# Patient Record
Sex: Female | Born: 1935 | Race: White | Hispanic: No | Smoking: Never smoker
Health system: Southern US, Community
[De-identification: ages and names within clinical notes are randomized; demographics above are authoritative.]

## PROBLEM LIST (undated history)

## (undated) DIAGNOSIS — H409 Unspecified glaucoma: Secondary | ICD-10-CM

## (undated) DIAGNOSIS — E785 Hyperlipidemia, unspecified: Secondary | ICD-10-CM

## (undated) DIAGNOSIS — R001 Bradycardia, unspecified: Secondary | ICD-10-CM

## (undated) DIAGNOSIS — M8580 Other specified disorders of bone density and structure, unspecified site: Secondary | ICD-10-CM

## (undated) DIAGNOSIS — M109 Gout, unspecified: Secondary | ICD-10-CM

## (undated) DIAGNOSIS — R42 Dizziness and giddiness: Secondary | ICD-10-CM

## (undated) DIAGNOSIS — I10 Essential (primary) hypertension: Secondary | ICD-10-CM

## (undated) DIAGNOSIS — E119 Type 2 diabetes mellitus without complications: Secondary | ICD-10-CM

## (undated) DIAGNOSIS — K5790 Diverticulosis of intestine, part unspecified, without perforation or abscess without bleeding: Secondary | ICD-10-CM

## (undated) DIAGNOSIS — M722 Plantar fascial fibromatosis: Secondary | ICD-10-CM

## (undated) HISTORY — DX: Dizziness and giddiness: R42

## (undated) HISTORY — DX: Diverticulosis of intestine, part unspecified, without perforation or abscess without bleeding: K57.90

## (undated) HISTORY — DX: Hyperlipidemia, unspecified: E78.5

## (undated) HISTORY — DX: Bradycardia, unspecified: R00.1

## (undated) HISTORY — PX: NASAL SEPTUM SURGERY: SHX37

## (undated) HISTORY — DX: Other specified disorders of bone density and structure, unspecified site: M85.80

## (undated) HISTORY — DX: Essential (primary) hypertension: I10

## (undated) HISTORY — PX: EYE SURGERY: SHX253

## (undated) HISTORY — PX: ABDOMINAL HYSTERECTOMY: SHX81

## (undated) HISTORY — DX: Unspecified glaucoma: H40.9

## (undated) HISTORY — PX: CHOLECYSTECTOMY: SHX55

## (undated) HISTORY — DX: Gout, unspecified: M10.9

## (undated) HISTORY — DX: Type 2 diabetes mellitus without complications: E11.9

## (undated) HISTORY — DX: Plantar fascial fibromatosis: M72.2

---

## 2000-07-23 ENCOUNTER — Encounter: Payer: Self-pay | Admitting: Family Medicine

## 2000-07-23 ENCOUNTER — Ambulatory Visit (HOSPITAL_COMMUNITY): Admission: RE | Admit: 2000-07-23 | Discharge: 2000-07-23 | Payer: Self-pay | Admitting: Family Medicine

## 2001-03-10 ENCOUNTER — Other Ambulatory Visit: Admission: RE | Admit: 2001-03-10 | Discharge: 2001-03-10 | Payer: Self-pay | Admitting: Dermatology

## 2001-08-06 ENCOUNTER — Ambulatory Visit (HOSPITAL_COMMUNITY): Admission: RE | Admit: 2001-08-06 | Discharge: 2001-08-06 | Payer: Self-pay | Admitting: Family Medicine

## 2001-08-06 ENCOUNTER — Encounter: Payer: Self-pay | Admitting: Family Medicine

## 2002-05-18 ENCOUNTER — Other Ambulatory Visit: Admission: RE | Admit: 2002-05-18 | Discharge: 2002-05-18 | Payer: Self-pay | Admitting: Dermatology

## 2002-08-19 ENCOUNTER — Ambulatory Visit (HOSPITAL_COMMUNITY): Admission: RE | Admit: 2002-08-19 | Discharge: 2002-08-19 | Payer: Self-pay

## 2003-08-23 ENCOUNTER — Ambulatory Visit (HOSPITAL_COMMUNITY): Admission: RE | Admit: 2003-08-23 | Discharge: 2003-08-23 | Payer: Self-pay | Admitting: Family Medicine

## 2004-08-28 ENCOUNTER — Ambulatory Visit (HOSPITAL_COMMUNITY): Admission: RE | Admit: 2004-08-28 | Discharge: 2004-08-28 | Payer: Self-pay | Admitting: Family Medicine

## 2005-08-31 ENCOUNTER — Ambulatory Visit (HOSPITAL_COMMUNITY): Admission: RE | Admit: 2005-08-31 | Discharge: 2005-08-31 | Payer: Self-pay | Admitting: Family Medicine

## 2006-09-03 ENCOUNTER — Ambulatory Visit (HOSPITAL_COMMUNITY): Admission: RE | Admit: 2006-09-03 | Discharge: 2006-09-03 | Payer: Self-pay | Admitting: Family Medicine

## 2007-09-04 ENCOUNTER — Ambulatory Visit (HOSPITAL_COMMUNITY): Admission: RE | Admit: 2007-09-04 | Discharge: 2007-09-04 | Payer: Self-pay | Admitting: Physician Assistant

## 2008-09-15 ENCOUNTER — Ambulatory Visit (HOSPITAL_COMMUNITY): Admission: RE | Admit: 2008-09-15 | Discharge: 2008-09-15 | Payer: Self-pay | Admitting: Family Medicine

## 2009-07-29 LAB — FECAL OCCULT BLOOD, GUAIAC: Fecal Occult Blood: NEGATIVE

## 2009-09-20 ENCOUNTER — Ambulatory Visit (HOSPITAL_COMMUNITY): Admission: RE | Admit: 2009-09-20 | Discharge: 2009-09-20 | Payer: Self-pay | Admitting: Family Medicine

## 2009-11-16 LAB — HM DEXA SCAN

## 2010-04-11 LAB — HM DIABETES EYE EXAM

## 2010-06-05 ENCOUNTER — Encounter: Payer: Self-pay | Admitting: Nurse Practitioner

## 2010-06-05 DIAGNOSIS — M722 Plantar fascial fibromatosis: Secondary | ICD-10-CM | POA: Insufficient documentation

## 2010-06-05 DIAGNOSIS — E785 Hyperlipidemia, unspecified: Secondary | ICD-10-CM

## 2010-06-05 DIAGNOSIS — H409 Unspecified glaucoma: Secondary | ICD-10-CM | POA: Insufficient documentation

## 2010-06-05 DIAGNOSIS — K5730 Diverticulosis of large intestine without perforation or abscess without bleeding: Secondary | ICD-10-CM

## 2010-06-05 DIAGNOSIS — M8580 Other specified disorders of bone density and structure, unspecified site: Secondary | ICD-10-CM

## 2010-06-05 DIAGNOSIS — I10 Essential (primary) hypertension: Secondary | ICD-10-CM

## 2010-06-05 DIAGNOSIS — I152 Hypertension secondary to endocrine disorders: Secondary | ICD-10-CM | POA: Insufficient documentation

## 2010-06-05 DIAGNOSIS — R001 Bradycardia, unspecified: Secondary | ICD-10-CM | POA: Insufficient documentation

## 2010-06-05 LAB — HEMOGLOBIN A1C: Hgb A1c MFr Bld: 6 % (ref 4.0–6.0)

## 2010-08-25 ENCOUNTER — Other Ambulatory Visit (HOSPITAL_COMMUNITY): Payer: Self-pay | Admitting: Family Medicine

## 2010-08-25 DIAGNOSIS — Z139 Encounter for screening, unspecified: Secondary | ICD-10-CM

## 2010-09-25 ENCOUNTER — Ambulatory Visit (HOSPITAL_COMMUNITY)
Admission: RE | Admit: 2010-09-25 | Discharge: 2010-09-25 | Disposition: A | Discharge status: Home / Self Care | Payer: Medicare Other | Source: Ambulatory Visit | Attending: Family Medicine | Admitting: Family Medicine

## 2010-09-25 DIAGNOSIS — Z139 Encounter for screening, unspecified: Secondary | ICD-10-CM

## 2010-09-25 DIAGNOSIS — Z1231 Encounter for screening mammogram for malignant neoplasm of breast: Secondary | ICD-10-CM | POA: Insufficient documentation

## 2011-08-27 ENCOUNTER — Other Ambulatory Visit: Payer: Self-pay | Admitting: Family Medicine

## 2011-08-27 DIAGNOSIS — Z139 Encounter for screening, unspecified: Secondary | ICD-10-CM

## 2011-10-08 ENCOUNTER — Ambulatory Visit (HOSPITAL_COMMUNITY)
Admission: RE | Admit: 2011-10-08 | Discharge: 2011-10-08 | Disposition: A | Discharge status: Home / Self Care | Payer: Medicare Other | Source: Ambulatory Visit | Attending: Family Medicine | Admitting: Family Medicine

## 2011-10-08 DIAGNOSIS — Z139 Encounter for screening, unspecified: Secondary | ICD-10-CM

## 2011-10-08 DIAGNOSIS — Z1231 Encounter for screening mammogram for malignant neoplasm of breast: Secondary | ICD-10-CM | POA: Insufficient documentation

## 2012-05-26 ENCOUNTER — Other Ambulatory Visit: Payer: Self-pay | Admitting: *Deleted

## 2012-06-02 ENCOUNTER — Other Ambulatory Visit: Payer: Self-pay | Admitting: Nurse Practitioner

## 2012-06-19 ENCOUNTER — Telehealth: Payer: Self-pay | Admitting: *Deleted

## 2012-06-19 MED ORDER — MECLIZINE HCL 25 MG PO TABS: 25.0000 mg | ORAL_TABLET | Freq: Three times a day (TID) | ORAL | Status: DC | PRN

## 2012-06-19 NOTE — Telephone Encounter (Signed)
PT NEEDS A ORDER FOR MECLIZINE 25MG  TO [facility] WITH DIRECTIONS AND QUANTITY. BUT NEVER FILLED AT [facility] BEFORE. THANKS.

## 2012-06-19 NOTE — Telephone Encounter (Signed)
RX sent to [facility] 

## 2012-06-19 NOTE — Telephone Encounter (Signed)
LAST SEEN 2/14 

## 2012-07-18 ENCOUNTER — Ambulatory Visit: Payer: Self-pay | Admitting: Nurse Practitioner

## 2012-07-30 ENCOUNTER — Ambulatory Visit (INDEPENDENT_AMBULATORY_CARE_PROVIDER_SITE_OTHER): Payer: Medicare Other

## 2012-07-30 ENCOUNTER — Ambulatory Visit (INDEPENDENT_AMBULATORY_CARE_PROVIDER_SITE_OTHER): Payer: Medicare Other | Admitting: Pharmacist

## 2012-07-30 ENCOUNTER — Encounter: Payer: Self-pay | Admitting: Pharmacist

## 2012-07-30 VITALS — Ht 61.0 in | Wt 176.0 lb

## 2012-07-30 DIAGNOSIS — M949 Disorder of cartilage, unspecified: Secondary | ICD-10-CM

## 2012-07-30 DIAGNOSIS — M899 Disorder of bone, unspecified: Secondary | ICD-10-CM

## 2012-07-30 DIAGNOSIS — M8580 Other specified disorders of bone density and structure, unspecified site: Secondary | ICD-10-CM

## 2012-07-30 NOTE — Patient Instructions (Signed)
Consider Silver Sneakers program for exercise - recommend weight bearing exercise 3 - 4 times per week   Fall Prevention and Home Safety Falls cause injuries and can affect all age groups. It is possible to use preventive measures to significantly decrease the likelihood of falls. There are many simple measures which can make your home safer and prevent falls. OUTDOORS  Repair cracks and edges of walkways and driveways.  Remove high doorway thresholds.  Trim shrubbery on the main path into your home.  Have good outside lighting.  Clear walkways of tools, rocks, debris, and clutter.  Check that handrails are not broken and are securely fastened. Both sides of steps should have handrails.  Have leaves, snow, and ice cleared regularly.  Use sand or salt on walkways during winter months.  In the garage, clean up grease or oil spills. BATHROOM  Install night lights.  Install grab bars by the toilet and in the tub and shower.  Use non-skid mats or decals in the tub or shower.  Place a plastic non-slip stool in the shower to sit on, if needed.  Keep floors dry and clean up all water on the floor immediately.  Remove soap buildup in the tub or shower on a regular basis.  Secure bath mats with non-slip, double-sided rug tape.  Remove throw rugs and tripping hazards from the floors. BEDROOMS  Install night lights.  Make sure a bedside light is easy to reach.  Do not use oversized bedding.  Keep a telephone by your bedside.  Have a firm chair with side arms to use for getting dressed.  Remove throw rugs and tripping hazards from the floor. KITCHEN  Keep handles on pots and pans turned toward the center of the stove. Use back burners when possible.  Clean up spills quickly and allow time for drying.  Avoid walking on wet floors.  Avoid hot utensils and knives.  Position shelves so they are not too high or low.  Place commonly used objects within easy reach.  If  necessary, use a sturdy step stool with a grab bar when reaching.  Keep electrical cables out of the way.  Do not use floor polish or wax that makes floors slippery. If you must use wax, use non-skid floor wax.  Remove throw rugs and tripping hazards from the floor. STAIRWAYS  Never leave objects on stairs.  Place handrails on both sides of stairways and use them. Fix any loose handrails. Make sure handrails on both sides of the stairways are as long as the stairs.  Check carpeting to make sure it is firmly attached along stairs. Make repairs to worn or loose carpet promptly.  Avoid placing throw rugs at the top or bottom of stairways, or properly secure the rug with carpet tape to prevent slippage. Get rid of throw rugs, if possible.  Have an electrician put in a light switch at the top and bottom of the stairs. OTHER FALL PREVENTION TIPS  Wear low-heel or rubber-soled shoes that are supportive and fit well. Wear closed toe shoes.  When using a stepladder, make sure it is fully opened and both spreaders are firmly locked. Do not climb a closed stepladder.  Add color or contrast paint or tape to grab bars and handrails in your home. Place contrasting color strips on first and last steps.  Learn and use mobility aids as needed. Install an electrical emergency response system.  Turn on lights to avoid dark areas. Replace light bulbs that burn out  immediately. Get light switches that glow.  Arrange furniture to create clear pathways. Keep furniture in the same place.  Firmly attach carpet with non-skid or double-sided tape.  Eliminate uneven floor surfaces.  Select a carpet pattern that does not visually hide the edge of steps.  Be aware of all pets. OTHER HOME SAFETY TIPS  Set the water temperature for 120 F (48.8 C).  Keep emergency numbers on or near the telephone.  Keep smoke detectors on every level of the home and near sleeping areas. Document Released: 02/16/2002  Document Revised: 08/28/2011 Document Reviewed: 05/18/2011 [facility] Patient Information 2014 [place].

## 2012-07-30 NOTE — Progress Notes (Signed)
Patient ID: Sara Holmes, female   DOB: 1928-06-28, 79 y.o.   MRN: [id number]  [facility] Current Height: Height: 5\' 1"  (154.9 cm)      Max Lifetime Height:  5\' 3"   Current Weight: Weight: 176 lb (79.833 kg)       Ethnicity:Caucasian    HPI: Does pt already have a diagnosis of:  Osteopenia?  Yes Osteoporosis?  No  Back Pain?  No       Kyphosis?  No Prior fracture?  No Med(s) for Osteoporosis/Osteopenia:  evista / raloxifene Med(s) previously tried for Osteoporosis/Osteopenia:  none                                                             PMH: Age at menopause:  79yo Hysterectomy?  Yes Oophorectomy?  Yes HRT? Yes - Former.  Type/duration: premarin  Steroid Use?  No Thyroid med?  No History of cancer?  No History of digestive disorders (ie Crohn's)?  No Current or previous eating disorders?  No Last Vitamin D Result:  50 (04/2012) Last GFR Result:  61 (04/2012)   FH/SH: Family history of osteoporosis?  No Parent with history of hip fracture?  Yes - mother Family history of breast cancer?  No Exercise?  No Smoking?  No Alcohol?  No    Calcium Assessment Calcium Intake  # of servings/day  Calcium mg  Milk (8 oz) 0.5  x  300  = 150mg   Yogurt (4 oz) 0 x  200 = 0  Cheese (1 oz) 0 x  200 = 0  Other Calcium sources   250mg   Ca supplement 600mg  qd to bid = 600 - 1200mg    Estimated calcium intake per day 1000 - 1600mg     DEXA Results Date of Test T-Score for AP Spine L1-L4 T-Score for Total Left Hip T-Score for Total Right Hip  07/30/2012 +0.2 -1.3 -1.1  11/16/2009 +0.1 -1.1 -0.8  11/05/2005 -0.6 -1.2 -1.1  11/01/2003 -1.0 -1.1 --    Assessment: Osteopenia - stable BMD with current therapy  Recommendations: 1.  Continue  risedronate (ACTONEL) 60mg  daily 2.  continue calcium 1200mg  daily either through supplementation   or diet.   3.  recommend weight bearing exercise - 30 minutes at least 4 days   per week.   4.  Counseled and educated about fall  risk and prevention.  Recheck DEXA:  2 years  Time spent counseling patient:  20 minutes

## 2012-08-06 ENCOUNTER — Encounter: Payer: Self-pay | Admitting: Nurse Practitioner

## 2012-08-06 ENCOUNTER — Ambulatory Visit (INDEPENDENT_AMBULATORY_CARE_PROVIDER_SITE_OTHER): Payer: Medicare Other | Admitting: Nurse Practitioner

## 2012-08-06 VITALS — BP 137/68 | HR 53 | Temp 97.5°F | Ht 61.0 in | Wt 177.0 lb

## 2012-08-06 DIAGNOSIS — H01139 Eczematous dermatitis of unspecified eye, unspecified eyelid: Secondary | ICD-10-CM

## 2012-08-06 DIAGNOSIS — E785 Hyperlipidemia, unspecified: Secondary | ICD-10-CM

## 2012-08-06 DIAGNOSIS — E1142 Type 2 diabetes mellitus with diabetic polyneuropathy: Secondary | ICD-10-CM

## 2012-08-06 DIAGNOSIS — E119 Type 2 diabetes mellitus without complications: Secondary | ICD-10-CM

## 2012-08-06 DIAGNOSIS — I10 Essential (primary) hypertension: Secondary | ICD-10-CM

## 2012-08-06 DIAGNOSIS — E1149 Type 2 diabetes mellitus with other diabetic neurological complication: Secondary | ICD-10-CM

## 2012-08-06 LAB — COMPLETE METABOLIC PANEL WITH GFR
AST: 17 U/L (ref 0–37)
Albumin: 3.9 g/dL (ref 3.5–5.2)
Alkaline Phosphatase: 51 U/L (ref 39–117)
BUN: 21 mg/dL (ref 6–23)
GFR, Est Non African American: 52 mL/min — ABNORMAL LOW
Glucose, Bld: 134 mg/dL — ABNORMAL HIGH (ref 70–99)
Potassium: 4.5 mEq/L (ref 3.5–5.3)
Total Bilirubin: 0.5 mg/dL (ref 0.3–1.2)

## 2012-08-06 MED ORDER — GABAPENTIN 300 MG PO CAPS: 300.0000 mg | ORAL_CAPSULE | Freq: Two times a day (BID) | ORAL | Status: DC

## 2012-08-06 MED ORDER — FLUTICASONE PROPIONATE 0.05 % EX CREA: TOPICAL_CREAM | Freq: Two times a day (BID) | CUTANEOUS | Status: DC

## 2012-08-06 NOTE — Progress Notes (Signed)
Subjective:     Patient ID: Sara Holmes, female   DOB: June 19, 1928, 79 y.o.   MRN: [id number]  Hypertension This is a chronic problem. The current episode started more than 1 year ago. The problem is unchanged. The problem is controlled. Associated symptoms include peripheral edema. Pertinent negatives include no blurred vision, chest pain, headaches, palpitations or shortness of breath. There are no associated agents to hypertension. Risk factors for coronary artery disease include diabetes mellitus, dyslipidemia, post-menopausal state and obesity. Past treatments include ACE inhibitors and diuretics. The current treatment provides significant improvement. There are no compliance problems.   Hyperlipidemia This is a chronic problem. The current episode started more than 1 year ago. The problem is controlled. Recent lipid tests were reviewed and are normal. Exacerbating diseases include diabetes and obesity. Pertinent negatives include no chest pain, leg pain or shortness of breath. Current antihyperlipidemic treatment includes statins and ezetimibe. The current treatment provides significant improvement of lipids. Risk factors for coronary artery disease include diabetes mellitus, hypertension, post-menopausal and obesity.  Diabetes She presents for her follow-up diabetic visit. She has type 2 diabetes mellitus. No MedicAlert identification noted. The initial diagnosis of diabetes was made 5 years ago. Her disease course has been stable. There are no hypoglycemic associated symptoms. Pertinent negatives for hypoglycemia include no headaches. Associated symptoms include foot paresthesias. Pertinent negatives for diabetes include no blurred vision and no chest pain. There are no hypoglycemic complications. Symptoms are stable. Diabetic complications include nephropathy (bil feet). Risk factors for coronary artery disease include dyslipidemia, hypertension, obesity and post-menopausal. Current diabetic  treatment includes oral agent (monotherapy). She is compliant with treatment all of the time. Her weight is stable. She is following a generally healthy diet. When asked about meal planning, she reported none. She has not had a previous visit with a dietician. She rarely participates in exercise. There is no change in her home blood glucose trend. Her breakfast blood glucose is taken between 8-9 am. Her breakfast blood glucose range is generally 90-110 mg/dl. Her overall blood glucose range is 90-110 mg/dl. An ACE inhibitor/angiotensin II receptor blocker is being taken. She does not see a podiatrist.Eye exam is current (February 2014).  Gout Patient had a flare up last week. Took her indocin and it resolved on it's own. Diabetic foot neuropathy Neurotin Qhs. Still has burning sensation at night. Review of Systems  Eyes: Negative for blurred vision.  Respiratory: Negative for shortness of breath.   Cardiovascular: Negative for chest pain and palpitations.  Neurological: Negative for headaches.  All other systems reviewed and are negative.       Objective:   Physical Exam  Constitutional: She is oriented to person, place, and time. She appears well-developed and well-nourished.  HENT:  Nose: Nose normal.  Mouth/Throat: Oropharynx is clear and moist.  Eyes: EOM are normal.  Neck: Trachea normal, normal range of motion and full passive range of motion without pain. Neck supple. No JVD present. Carotid bruit is not present. No thyromegaly present.  Cardiovascular: Normal rate, regular rhythm, normal heart sounds and intact distal pulses.  Exam reveals no gallop and no friction rub.   No murmur heard. Pulmonary/Chest: Effort normal and breath sounds normal.  Abdominal: Soft. Bowel sounds are normal. She exhibits no distension and no mass. There is no tenderness.  Musculoskeletal: Normal range of motion.  Lymphadenopathy:    She has no cervical adenopathy.  Neurological: She is alert and  oriented to person, place, and time. She has normal  reflexes.  Skin: Skin is warm and dry.  Psychiatric: She has a normal mood and affect. Her behavior is normal. Judgment and thought content normal.  see diabetic foot exam BP 137/68  Pulse 53  Temp(Src) 97.5 F (36.4 C) (Oral)  Ht 5\' 1"  (1.549 m)  Wt 177 lb (80.287 kg)  BMI 33.46 kg/m2  Results for orders placed in visit on 08/06/12  POCT GLYCOSYLATED HEMOGLOBIN (HGB A1C)      Result Value Range   Hemoglobin A1C 6.5         Assessment:     1. HTN (hypertension)   2. Other and unspecified hyperlipidemia   3. Diabetes   4. Diabetic peripheral neuropathy   5. Atopic dermatitis of eyelid, unspecified laterality         Plan:     Orders Placed This Encounter  Procedures  . COMPLETE METABOLIC PANEL WITH GFR  . NMR Lipoprofile with Lipids  . POCT glycosylated hemoglobin (Hb A1C)     Medication List       These changes are accurate as of: 08/06/2012  9:24 AM. If you have any questions, ask your nurse or doctor.          TAKE these medications       allopurinol 100 MG tablet  Commonly known as:  ZYLOPRIM  Take 100 mg by mouth daily.     aspirin 81 MG chewable tablet  Chew 81 mg by mouth daily.     CALTRATE 600+D PO  Take 1 tablet by mouth 2 (two) times daily.     felodipine 2.5 MG 24 hr tablet  Commonly known as:  PLENDIL  Take 2.5 mg by mouth daily.     fluticasone 0.05 % cream  Commonly known as:  CUTIVATE  Apply topically 2 (two) times daily.  Started by:  Bennie Pierini, FNP     fosinopril 40 MG tablet  Commonly known as:  MONOPRIL  Take 40 mg by mouth 2 (two) times daily.     gabapentin 300 MG capsule  Commonly known as:  NEURONTIN  Take 1 capsule (300 mg total) by mouth 2 (two) times daily.  Changed by:  Bennie Pierini, FNP     indomethacin 75 MG CR capsule  Commonly known as:  INDOCIN SR  Take 75 mg by mouth 3 (three) times daily as needed.     meclizine 25 MG tablet  Commonly  known as:  MEDI-MECLIZINE  Take 1 tablet (25 mg total) by mouth 3 (three) times daily as needed.     metFORMIN 1000 MG tablet  Commonly known as:  GLUCOPHAGE     raloxifene 60 MG tablet  Commonly known as:  EVISTA  Take 60 mg by mouth daily.     rosuvastatin 5 MG tablet  Commonly known as:  CRESTOR  Take 5 mg by mouth daily.     triamterene-hydrochlorothiazide 37.5-25 MG per tablet  Commonly known as:  MAXZIDE-25  Take 1 tablet by mouth daily.     Vitamin D3 1000 UNITS Caps  Take 1 tablet by mouth daily.       Low carb diet Exercise when can Follow up in 3 months  Mary-Margaret Daphine Deutscher, FNP

## 2012-08-06 NOTE — Patient Instructions (Signed)

## 2012-08-07 LAB — NMR LIPOPROFILE WITH LIPIDS
Cholesterol, Total: 122 mg/dL (ref <200)
HDL Size: 9.1 nm — ABNORMAL LOW (ref >=9.2)
HDL-C: 46 mg/dL (ref >=40)
LDL (calc): 51 mg/dL (ref <100)
LDL Particle Number: 1012 nmol/L — ABNORMAL HIGH (ref <1000)
LP-IR Score: 46 — ABNORMAL HIGH (ref <=45)
Triglycerides: 124 mg/dL (ref <150)
VLDL Size: 52.4 nm — ABNORMAL HIGH (ref <=46.6)

## 2012-08-12 ENCOUNTER — Other Ambulatory Visit: Payer: Self-pay | Admitting: Nurse Practitioner

## 2012-08-19 NOTE — Progress Notes (Signed)
Patient ID: Sara Holmes, female   DOB: Dec 07, 1928, 79 y.o.   MRN: [id number]  Note patient is not taking actonel but is to continue Evista 60mg  1 tablet daily.

## 2012-09-08 ENCOUNTER — Other Ambulatory Visit: Payer: Self-pay | Admitting: Family Medicine

## 2012-09-08 DIAGNOSIS — Z139 Encounter for screening, unspecified: Secondary | ICD-10-CM

## 2012-09-10 ENCOUNTER — Other Ambulatory Visit: Payer: Self-pay | Admitting: Nurse Practitioner

## 2012-09-24 ENCOUNTER — Other Ambulatory Visit: Payer: Self-pay | Admitting: Nurse Practitioner

## 2012-10-13 ENCOUNTER — Other Ambulatory Visit: Payer: Self-pay | Admitting: Nurse Practitioner

## 2012-10-13 ENCOUNTER — Ambulatory Visit (HOSPITAL_COMMUNITY)
Admission: RE | Admit: 2012-10-13 | Discharge: 2012-10-13 | Disposition: A | Discharge status: Home / Self Care | Payer: Medicare Other | Source: Ambulatory Visit | Attending: Family Medicine | Admitting: Family Medicine

## 2012-10-13 DIAGNOSIS — Z139 Encounter for screening, unspecified: Secondary | ICD-10-CM

## 2012-10-13 DIAGNOSIS — Z1231 Encounter for screening mammogram for malignant neoplasm of breast: Secondary | ICD-10-CM | POA: Insufficient documentation

## 2012-10-22 ENCOUNTER — Other Ambulatory Visit: Payer: Self-pay | Admitting: Nurse Practitioner

## 2012-10-24 ENCOUNTER — Other Ambulatory Visit: Payer: Self-pay | Admitting: *Deleted

## 2012-10-24 MED ORDER — ROSUVASTATIN CALCIUM 10 MG PO TABS: 10.0000 mg | ORAL_TABLET | Freq: Every day | ORAL | Status: DC

## 2012-10-29 ENCOUNTER — Encounter: Payer: Self-pay | Admitting: *Deleted

## 2012-11-07 ENCOUNTER — Ambulatory Visit: Payer: Medicare Other | Admitting: Nurse Practitioner

## 2012-11-12 ENCOUNTER — Ambulatory Visit: Payer: Medicare Other | Admitting: Nurse Practitioner

## 2012-11-12 ENCOUNTER — Other Ambulatory Visit: Payer: Self-pay | Admitting: Nurse Practitioner

## 2012-12-03 ENCOUNTER — Ambulatory Visit (INDEPENDENT_AMBULATORY_CARE_PROVIDER_SITE_OTHER): Payer: Medicare Other | Admitting: Nurse Practitioner

## 2012-12-03 ENCOUNTER — Encounter: Payer: Self-pay | Admitting: Nurse Practitioner

## 2012-12-03 VITALS — BP 115/68 | HR 64 | Temp 97.6°F | Ht 61.0 in | Wt 177.0 lb

## 2012-12-03 DIAGNOSIS — E785 Hyperlipidemia, unspecified: Secondary | ICD-10-CM

## 2012-12-03 DIAGNOSIS — I10 Essential (primary) hypertension: Secondary | ICD-10-CM

## 2012-12-03 DIAGNOSIS — Z23 Encounter for immunization: Secondary | ICD-10-CM

## 2012-12-03 DIAGNOSIS — E119 Type 2 diabetes mellitus without complications: Secondary | ICD-10-CM

## 2012-12-03 DIAGNOSIS — E1149 Type 2 diabetes mellitus with other diabetic neurological complication: Secondary | ICD-10-CM

## 2012-12-03 DIAGNOSIS — E1142 Type 2 diabetes mellitus with diabetic polyneuropathy: Secondary | ICD-10-CM

## 2012-12-03 MED ORDER — TRIAMTERENE-HCTZ 37.5-25 MG PO TABS: 1.0000 | ORAL_TABLET | Freq: Every day | ORAL | Status: DC

## 2012-12-03 MED ORDER — METFORMIN HCL 1000 MG PO TABS: 1000.0000 mg | ORAL_TABLET | Freq: Two times a day (BID) | ORAL | Status: DC

## 2012-12-03 MED ORDER — ALLOPURINOL 100 MG PO TABS: 100.0000 mg | ORAL_TABLET | Freq: Every day | ORAL | Status: DC

## 2012-12-03 MED ORDER — RALOXIFENE HCL 60 MG PO TABS: 60.0000 mg | ORAL_TABLET | Freq: Every day | ORAL | Status: DC

## 2012-12-03 MED ORDER — GABAPENTIN 300 MG PO CAPS: 300.0000 mg | ORAL_CAPSULE | Freq: Two times a day (BID) | ORAL | Status: DC

## 2012-12-03 MED ORDER — ROSUVASTATIN CALCIUM 10 MG PO TABS: 10.0000 mg | ORAL_TABLET | Freq: Every day | ORAL | Status: DC

## 2012-12-03 MED ORDER — FOSINOPRIL SODIUM 40 MG PO TABS: 40.0000 mg | ORAL_TABLET | Freq: Every day | ORAL | Status: DC

## 2012-12-03 MED ORDER — FELODIPINE ER 2.5 MG PO TB24: 2.5000 mg | ORAL_TABLET | Freq: Every day | ORAL | Status: DC

## 2012-12-03 NOTE — Patient Instructions (Signed)

## 2012-12-03 NOTE — Addendum Note (Signed)
Addended by: Prescott Gum on: 12/03/2012 10:44 AM   Modules accepted: Orders

## 2012-12-03 NOTE — Progress Notes (Signed)
Subjective:     Patient ID: Sara Holmes, female   DOB: 1928/05/24, 79 y.o.   MRN: [id number]  Hypertension This is a chronic problem. The current episode started more than 1 year ago. The problem is unchanged. The problem is controlled. Associated symptoms include peripheral edema. Pertinent negatives include no blurred vision, chest pain, headaches, palpitations or shortness of breath. There are no associated agents to hypertension. Risk factors for coronary artery disease include diabetes mellitus, dyslipidemia, post-menopausal state and obesity. Past treatments include ACE inhibitors and diuretics. The current treatment provides significant improvement. There are no compliance problems.   Hyperlipidemia This is a chronic problem. The current episode started more than 1 year ago. The problem is controlled. Recent lipid tests were reviewed and are normal. Exacerbating diseases include diabetes and obesity. Pertinent negatives include no chest pain, leg pain or shortness of breath. Current antihyperlipidemic treatment includes statins and ezetimibe. The current treatment provides significant improvement of lipids. Risk factors for coronary artery disease include diabetes mellitus, hypertension, post-menopausal and obesity.  Diabetes She presents for her follow-up diabetic visit. She has type 2 diabetes mellitus. No MedicAlert identification noted. The initial diagnosis of diabetes was made 5 years ago. Her disease course has been stable. There are no hypoglycemic associated symptoms. Pertinent negatives for hypoglycemia include no headaches. Associated symptoms include foot paresthesias. Pertinent negatives for diabetes include no blurred vision and no chest pain. There are no hypoglycemic complications. Symptoms are stable. Diabetic complications include nephropathy (bil feet). Risk factors for coronary artery disease include dyslipidemia, hypertension, obesity and post-menopausal. Current diabetic  treatment includes oral agent (monotherapy). She is compliant with treatment all of the time. Her weight is stable. She is following a generally healthy diet. When asked about meal planning, she reported none. She has not had a previous visit with a dietician. She rarely participates in exercise. There is no change in her home blood glucose trend. Her breakfast blood glucose is taken between 8-9 am. Her breakfast blood glucose range is generally 90-110 mg/dl. Her overall blood glucose range is 90-110 mg/dl. An ACE inhibitor/angiotensin II receptor blocker is being taken. She does not see a podiatrist.Eye exam is current (February 2014).  Gout Patient had a flare up last week. Took her indocin and it resolved on it's own. Diabetic foot neuropathy Neurotin Qhs. Still has burning sensation at night. Review of Systems  Eyes: Negative for blurred vision.  Respiratory: Negative for shortness of breath.   Cardiovascular: Negative for chest pain and palpitations.  Neurological: Negative for headaches.  All other systems reviewed and are negative.       Objective:   Physical Exam  Constitutional: She is oriented to person, place, and time. She appears well-developed and well-nourished.  HENT:  Nose: Nose normal.  Mouth/Throat: Oropharynx is clear and moist.  Eyes: EOM are normal.  Neck: Trachea normal, normal range of motion and full passive range of motion without pain. Neck supple. No JVD present. Carotid bruit is not present. No thyromegaly present.  Cardiovascular: Normal rate, regular rhythm, normal heart sounds and intact distal pulses.  Exam reveals no gallop and no friction rub.   No murmur heard. Pulmonary/Chest: Effort normal and breath sounds normal.  Abdominal: Soft. Bowel sounds are normal. She exhibits no distension and no mass. There is no tenderness.  Musculoskeletal: Normal range of motion.  Lymphadenopathy:    She has no cervical adenopathy.  Neurological: She is alert and  oriented to person, place, and time. She has normal  reflexes.  Skin: Skin is warm and dry.  Psychiatric: She has a normal mood and affect. Her behavior is normal. Judgment and thought content normal.  see diabetic foot exam BP 115/68  Pulse 64  Temp(Src) 97.6 F (36.4 C) (Oral)  Ht 5\' 1"  (1.549 m)  Wt 177 lb (80.287 kg)  BMI 33.46 kg/m2  Results for orders placed in visit on 12/03/12  POCT GLYCOSYLATED HEMOGLOBIN (HGB A1C)      Result Value Range   Hemoglobin A1C 5.6%         Assessment:     1. Hypertension   2. Hyperlipidemia   3. Diabetes         Plan:     Orders Placed This Encounter  Procedures  . CMP14+EGFR  . NMR, lipoprofile  . POCT glycosylated hemoglobin (Hb A1C)   Meds ordered this encounter  Medications  . raloxifene (EVISTA) 60 MG tablet    Sig: Take 1 tablet (60 mg total) by mouth daily.    Dispense:  30 tablet    Refill:  5    Order Specific Question:  Supervising Provider    Answer:  Ernestina Penna [[id number]]  . metFORMIN (GLUCOPHAGE) 1000 MG tablet    Sig: Take 1 tablet (1,000 mg total) by mouth 2 (two) times daily with a meal.    Dispense:  60 tablet    Refill:  5    Needs to be seen before next refill    Order Specific Question:  Supervising Provider    Answer:  Ernestina Penna [[id number]]  . fosinopril (MONOPRIL) 40 MG tablet    Sig: Take 1 tablet (40 mg total) by mouth daily.    Dispense:  60 tablet    Refill:  5    Order Specific Question:  Supervising Provider    Answer:  Ernestina Penna [[id number]]  . felodipine (PLENDIL) 2.5 MG 24 hr tablet    Sig: Take 1 tablet (2.5 mg total) by mouth daily.    Dispense:  30 tablet    Refill:  5    Order Specific Question:  Supervising Provider    Answer:  Ernestina Penna [[id number]]  . allopurinol (ZYLOPRIM) 100 MG tablet    Sig: Take 1 tablet (100 mg total) by mouth daily.    Dispense:  30 tablet    Refill:  5    Order Specific Question:  Supervising Provider    Answer:  Ernestina Penna [[id number]]  . gabapentin  (NEURONTIN) 300 MG capsule    Sig: Take 1 capsule (300 mg total) by mouth 2 (two) times daily.    Dispense:  60 capsule    Refill:  5    Order Specific Question:  Supervising Provider    Answer:  Ernestina Penna [[id number]]  . rosuvastatin (CRESTOR) 10 MG tablet    Sig: Take 1 tablet (10 mg total) by mouth daily.    Dispense:  30 tablet    Refill:  5    Order Specific Question:  Supervising Provider    Answer:  Ernestina Penna [[id number]]  . triamterene-hydrochlorothiazide (MAXZIDE-25) 37.5-25 MG per tablet    Sig: Take 1 tablet by mouth daily.    Dispense:  30 tablet    Refill:  5    Order Specific Question:  Supervising Provider    Answer:  Ernestina Penna [[id number]]    Continue all meds Low carb diet Exercise when can Follow up in 3 months  Mary-Margaret Daphine Deutscher, FNP

## 2012-12-05 LAB — CMP14+EGFR
AST: 22 IU/L (ref 0–40)
Alkaline Phosphatase: 64 IU/L (ref 39–117)
BUN/Creatinine Ratio: 18 (ref 11–26)
CO2: 30 mmol/L — ABNORMAL HIGH (ref 18–29)
Chloride: 102 mmol/L (ref 97–108)
Potassium: 4.7 mmol/L (ref 3.5–5.2)
Sodium: 145 mmol/L — ABNORMAL HIGH (ref 134–144)

## 2012-12-05 LAB — NMR, LIPOPROFILE
LDL Particle Number: 928 nmol/L (ref <1000)
LDLC SERPL CALC-MCNC: 45 mg/dL (ref <100)
LP-IR Score: 65 — ABNORMAL HIGH (ref <=45)

## 2012-12-17 ENCOUNTER — Telehealth: Payer: Self-pay | Admitting: Nurse Practitioner

## 2012-12-17 NOTE — Telephone Encounter (Signed)
Patient aware.

## 2013-01-19 ENCOUNTER — Other Ambulatory Visit: Payer: Self-pay | Admitting: Nurse Practitioner

## 2013-03-18 ENCOUNTER — Encounter: Payer: Self-pay | Admitting: Nurse Practitioner

## 2013-03-18 ENCOUNTER — Ambulatory Visit (INDEPENDENT_AMBULATORY_CARE_PROVIDER_SITE_OTHER): Payer: Medicare Other | Admitting: Nurse Practitioner

## 2013-03-18 VITALS — BP 130/67 | HR 74 | Temp 97.8°F | Ht 61.0 in | Wt 176.0 lb

## 2013-03-18 DIAGNOSIS — I10 Essential (primary) hypertension: Secondary | ICD-10-CM

## 2013-03-18 DIAGNOSIS — E785 Hyperlipidemia, unspecified: Secondary | ICD-10-CM

## 2013-03-18 DIAGNOSIS — E119 Type 2 diabetes mellitus without complications: Secondary | ICD-10-CM

## 2013-03-18 LAB — POCT GLYCOSYLATED HEMOGLOBIN (HGB A1C)

## 2013-03-18 MED ORDER — FOSINOPRIL SODIUM 40 MG PO TABS: 40.0000 mg | ORAL_TABLET | Freq: Every day | ORAL | Status: DC

## 2013-03-18 MED ORDER — FELODIPINE ER 2.5 MG PO TB24: 2.5000 mg | ORAL_TABLET | Freq: Every day | ORAL | Status: DC

## 2013-03-18 MED ORDER — ALLOPURINOL 100 MG PO TABS: 100.0000 mg | ORAL_TABLET | Freq: Every day | ORAL | Status: DC

## 2013-03-18 NOTE — Patient Instructions (Signed)

## 2013-03-18 NOTE — Progress Notes (Signed)
Subjective:    Patient ID: Sara Holmes, female    DOB: [birth year], 80 y.o.   MRN: [id number]  Pt here for chronic medical follow up.  Denies changes since last visit.  C/o right ear wax.  Denies ear pain, popping, fever, upper respiratory illness.  Hypertension This is a chronic problem. The current episode started more than 1 year ago. The problem is unchanged. The problem is controlled. Associated symptoms include peripheral edema. Pertinent negatives include no blurred vision, chest pain, headaches, palpitations or shortness of breath. There are no associated agents to hypertension. Risk factors for coronary artery disease include diabetes mellitus, dyslipidemia, post-menopausal state and obesity. Past treatments include ACE inhibitors and diuretics. The current treatment provides significant improvement. There are no compliance problems.   Hyperlipidemia This is a chronic problem. The current episode started more than 1 year ago. The problem is controlled. Recent lipid tests were reviewed and are normal. Exacerbating diseases include diabetes and obesity. Pertinent negatives include no chest pain, leg pain, myalgias or shortness of breath. Current antihyperlipidemic treatment includes statins and ezetimibe. The current treatment provides significant improvement of lipids. Risk factors for coronary artery disease include diabetes mellitus, hypertension, post-menopausal and obesity.  Diabetes She presents for her follow-up diabetic visit. She has type 2 diabetes mellitus. No MedicAlert identification noted. The initial diagnosis of diabetes was made 5 years ago. Her disease course has been stable. There are no hypoglycemic associated symptoms. Pertinent negatives for hypoglycemia include no headaches. Associated symptoms include foot paresthesias. Pertinent negatives for diabetes include no blurred vision, no chest pain and no fatigue. There are no hypoglycemic complications. Symptoms are stable.  Diabetic complications include nephropathy (bil feet). Risk factors for coronary artery disease include dyslipidemia, hypertension, obesity and post-menopausal. Current diabetic treatment includes oral agent (monotherapy). She is compliant with treatment all of the time. Her weight is stable. She is following a generally healthy diet. When asked about meal planning, she reported none. She has not had a previous visit with a dietician. She rarely participates in exercise. There is no change in her home blood glucose trend. Her breakfast blood glucose is taken between 8-9 am. Her breakfast blood glucose range is generally 90-110 mg/dl. Her overall blood glucose range is 90-110 mg/dl. An ACE inhibitor/angiotensin II receptor blocker is being taken. She does not see a podiatrist.Eye exam is current (February 2014).      Review of Systems  Constitutional: Negative.  Negative for fever, activity change and fatigue.  HENT: Negative for congestion, ear discharge and ear pain.   Eyes: Negative.  Negative for blurred vision and visual disturbance.  Respiratory: Negative.  Negative for shortness of breath.   Cardiovascular: Negative for chest pain and palpitations.  Gastrointestinal: Negative for abdominal pain, constipation and blood in stool.  Genitourinary: Negative.   Musculoskeletal: Negative.  Negative for joint swelling and myalgias.  Neurological: Negative for headaches.  All other systems reviewed and are negative.       Objective:   Physical Exam  Constitutional: She is oriented to person, place, and time. She appears well-developed and well-nourished.  HENT:  Nose: Nose normal.  Mouth/Throat: Oropharynx is clear and moist.  Small amount of cerumen removed from right ear with currette.  Eyes: EOM are normal.  Neck: Trachea normal, normal range of motion and full passive range of motion without pain. Neck supple. No JVD present. Carotid bruit is not present. No thyromegaly present.    Cardiovascular: Normal rate, regular rhythm, normal heart sounds  and intact distal pulses.  Exam reveals no gallop and no friction rub.   No murmur heard. Pulmonary/Chest: Effort normal and breath sounds normal.  Abdominal: Soft. Bowel sounds are normal. She exhibits no distension and no mass. There is no tenderness.  Musculoskeletal: Normal range of motion.  Lymphadenopathy:    She has no cervical adenopathy.  Neurological: She is alert and oriented to person, place, and time. She has normal reflexes.  Skin: Skin is warm and dry.  Psychiatric: She has a normal mood and affect. Her behavior is normal. Judgment and thought content normal.     BP 130/67  Pulse 74  Temp(Src) 97.8 F (36.6 C) (Oral)  Ht _0  (1.549 m)  Wt 176 lb (79.833 kg)  BMI 33.27 kg/m2 Results for orders placed in visit on 03/18/13  POCT GLYCOSYLATED HEMOGLOBIN (HGB A1C)      Result Value Range   Hemoglobin A1C 6.4%         Assessment & Plan:   1. Type II or unspecified type diabetes mellitus without mention of complication, not stated as uncontrolled   2. Hypertension   3. Hyperlipidemia    Orders Placed This Encounter  Procedures  . CMP14+EGFR  . NMR, lipoprofile  . POCT glycosylated hemoglobin (Hb A1C)   Meds ordered this encounter  Medications  . fosinopril (MONOPRIL) 40 MG tablet    Sig: Take 1 tablet (40 mg total) by mouth daily.    Dispense:  60 tablet    Refill:  5    Order Specific Question:  Supervising Provider    Answer:  Chipper Herb [[id number]]  . felodipine (PLENDIL) 2.5 MG 24 hr tablet    Sig: Take 1 tablet (2.5 mg total) by mouth daily.    Dispense:  30 tablet    Refill:  5    Order Specific Question:  Supervising Provider    Answer:  Chipper Herb [[id number]]  . allopurinol (ZYLOPRIM) 100 MG tablet    Sig: Take 1 tablet (100 mg total) by mouth daily.    Dispense:  30 tablet    Refill:  5    Order Specific Question:  Supervising Provider    Answer:  Joycelyn Man     Continue all meds Labs pending Diet and exercise encouraged Health maintenance reviewed Follow up in 3 months  [place], FNP

## 2013-03-20 LAB — CMP14+EGFR
A/G RATIO: 2.5 (ref 1.1–2.5)
ALT: 10 IU/L (ref 0–32)
AST: 19 IU/L (ref 0–40)
Albumin: 4.2 g/dL (ref 3.5–4.7)
Alkaline Phosphatase: 60 IU/L (ref 39–117)
BUN/Creatinine Ratio: 21 (ref 11–26)
BUN: 19 mg/dL (ref 8–27)
CO2: 26 mmol/L (ref 18–29)
Calcium: 9.2 mg/dL (ref 8.6–10.2)
Chloride: 101 mmol/L (ref 97–108)
Creatinine, Ser: 0.92 mg/dL (ref 0.57–1.00)
GFR calc Af Amer: 67 mL/min/{1.73_m2} (ref >59)
GFR, EST NON AFRICAN AMERICAN: 58 mL/min/{1.73_m2} — AB (ref >59)
GLUCOSE: 132 mg/dL — AB (ref 65–99)
Globulin, Total: 1.7 g/dL (ref 1.5–4.5)
POTASSIUM: 4.7 mmol/L (ref 3.5–5.2)
SODIUM: 143 mmol/L (ref 134–144)
TOTAL PROTEIN: 5.9 g/dL — AB (ref 6.0–8.5)
Total Bilirubin: 0.7 mg/dL (ref 0.0–1.2)

## 2013-03-20 LAB — NMR, LIPOPROFILE
Cholesterol: 130 mg/dL (ref <200)
HDL Cholesterol by NMR: 52 mg/dL (ref >=40)
HDL Particle Number: 40.8 umol/L (ref >=30.5)
LDL PARTICLE NUMBER: 901 nmol/L (ref <1000)
LDL Size: 20.2 nm — ABNORMAL LOW (ref >20.5)
LDLC SERPL CALC-MCNC: 53 mg/dL (ref <100)
LP-IR Score: 49 — ABNORMAL HIGH (ref <=45)
SMALL LDL PARTICLE NUMBER: 550 nmol/L — AB (ref <=527)
Triglycerides by NMR: 127 mg/dL (ref <150)

## 2013-04-10 ENCOUNTER — Other Ambulatory Visit: Payer: Self-pay | Admitting: Nurse Practitioner

## 2013-04-10 ENCOUNTER — Other Ambulatory Visit: Payer: Self-pay | Admitting: *Deleted

## 2013-04-10 MED ORDER — GLUCOSE BLOOD VI STRP: ORAL_STRIP | Status: DC

## 2013-06-22 ENCOUNTER — Ambulatory Visit (INDEPENDENT_AMBULATORY_CARE_PROVIDER_SITE_OTHER): Payer: Medicare Other | Admitting: Nurse Practitioner

## 2013-06-22 ENCOUNTER — Encounter: Payer: Self-pay | Admitting: Nurse Practitioner

## 2013-06-22 VITALS — BP 118/65 | HR 65 | Temp 98.5°F | Ht 61.0 in | Wt 172.2 lb

## 2013-06-22 DIAGNOSIS — E119 Type 2 diabetes mellitus without complications: Secondary | ICD-10-CM

## 2013-06-22 DIAGNOSIS — E785 Hyperlipidemia, unspecified: Secondary | ICD-10-CM

## 2013-06-22 DIAGNOSIS — I10 Essential (primary) hypertension: Secondary | ICD-10-CM

## 2013-06-22 LAB — POCT GLYCOSYLATED HEMOGLOBIN (HGB A1C): Hemoglobin A1C: 6.3

## 2013-06-22 NOTE — Patient Instructions (Signed)

## 2013-06-22 NOTE — Progress Notes (Signed)
Subjective:    Patient ID: Sara Holmes, female    DOB: [birth year], 80 y.o.   MRN: [id number]  Pt here for chronic medical follow up. Since last visit she has had shingles, and surgery for blocked tear duct. SHe is doing much better today without complaints.  Diabetes She presents for her follow-up diabetic visit. She has type 2 diabetes mellitus. No MedicAlert identification noted. The initial diagnosis of diabetes was made 5 years ago. Her disease course has been stable. There are no hypoglycemic associated symptoms. Pertinent negatives for hypoglycemia include no headaches. Associated symptoms include foot paresthesias. Pertinent negatives for diabetes include no blurred vision, no chest pain and no fatigue. There are no hypoglycemic complications. Symptoms are stable. Diabetic complications include nephropathy (bil feet). Risk factors for coronary artery disease include dyslipidemia, hypertension, obesity and post-menopausal. Current diabetic treatment includes oral agent (monotherapy). She is compliant with treatment all of the time. Her weight is stable. She is following a generally healthy diet. When asked about meal planning, she reported none. She has not had a previous visit with a dietician. She rarely participates in exercise. There is no change in her home blood glucose trend. Her breakfast blood glucose is taken between 8-9 am. Her breakfast blood glucose range is generally 90-110 mg/dl. Her overall blood glucose range is 90-110 mg/dl. An ACE inhibitor/angiotensin II receptor blocker is being taken. She does not see a podiatrist.Eye exam is current (February 2014).  Hypertension This is a chronic problem. The current episode started more than 1 year ago. The problem is unchanged. The problem is controlled. Associated symptoms include peripheral edema. Pertinent negatives include no blurred vision, chest pain, headaches, palpitations or shortness of breath. There are no associated agents  to hypertension. Risk factors for coronary artery disease include diabetes mellitus, dyslipidemia, post-menopausal state and obesity. Past treatments include ACE inhibitors and diuretics. The current treatment provides significant improvement. There are no compliance problems.   Hyperlipidemia This is a chronic problem. The current episode started more than 1 year ago. The problem is controlled. Recent lipid tests were reviewed and are normal. Exacerbating diseases include diabetes and obesity. Pertinent negatives include no chest pain, leg pain, myalgias or shortness of breath. Current antihyperlipidemic treatment includes statins and ezetimibe. The current treatment provides significant improvement of lipids. Risk factors for coronary artery disease include diabetes mellitus, hypertension, post-menopausal and obesity.      Review of Systems  Constitutional: Negative.  Negative for fever, activity change and fatigue.  HENT: Negative for congestion, ear discharge and ear pain.   Eyes: Negative.  Negative for blurred vision and visual disturbance.  Respiratory: Negative.  Negative for shortness of breath.   Cardiovascular: Negative for chest pain and palpitations.  Gastrointestinal: Negative for abdominal pain, constipation and blood in stool.  Genitourinary: Negative.   Musculoskeletal: Negative.  Negative for joint swelling and myalgias.  Neurological: Negative for headaches.  All other systems reviewed and are negative.      Objective:   Physical Exam  Constitutional: She is oriented to person, place, and time. She appears well-developed and well-nourished.  HENT:  Nose: Nose normal.  Mouth/Throat: Oropharynx is clear and moist.  Small amount of cerumen removed from right ear with currette.  Eyes: EOM are normal.  Neck: Trachea normal, normal range of motion and full passive range of motion without pain. Neck supple. No JVD present. Carotid bruit is not present. No thyromegaly present.   Cardiovascular: Normal rate, regular rhythm, normal heart sounds and  intact distal pulses.  Exam reveals no gallop and no friction rub.   No murmur heard. Pulmonary/Chest: Effort normal and breath sounds normal.  Abdominal: Soft. Bowel sounds are normal. She exhibits no distension and no mass. There is no tenderness.  Musculoskeletal: Normal range of motion.  Lymphadenopathy:    She has no cervical adenopathy.  Neurological: She is alert and oriented to person, place, and time. She has normal reflexes.  Skin: Skin is warm and dry.  Psychiatric: She has a normal mood and affect. Her behavior is normal. Judgment and thought content normal.     BP 118/65  Pulse 65  Temp(Src) 98.5 F (36.9 C) (Oral)  Ht '5\' 1"'  (1.549 m)  Wt 172 lb 3.2 oz (78.109 kg)  BMI 32.55 kg/m2 Results for orders placed in visit on 06/22/13  POCT GLYCOSYLATED HEMOGLOBIN (HGB A1C)      Result Value Ref Range   Hemoglobin A1C 6.3         Assessment & Plan:    1. Diabetes   2. Hypertension   3. Hyperlipidemia   4. Type II or unspecified type diabetes mellitus without mention of complication, not stated as uncontrolled    Orders Placed This Encounter  Procedures  . CMP14+EGFR  . NMR, lipoprofile  . POCT glycosylated hemoglobin (Hb A1C)    Labs pending Health maintenance reviewed Diet and exercise encouraged Continue all meds Follow up  In 3 months   [place], FNP

## 2013-06-23 LAB — NMR, LIPOPROFILE
Cholesterol: 118 mg/dL (ref <200)
HDL Cholesterol by NMR: 50 mg/dL (ref >=40)
HDL Particle Number: 39.8 umol/L (ref >=30.5)
LDL Particle Number: 659 nmol/L (ref <1000)
LDL SIZE: 20 nm (ref >20.5)
LDLC SERPL CALC-MCNC: 38 mg/dL (ref <100)
LP-IR SCORE: 54 — AB (ref <=45)
Small LDL Particle Number: 526 nmol/L (ref <=527)
Triglycerides by NMR: 148 mg/dL (ref <150)

## 2013-06-23 LAB — CMP14+EGFR
A/G RATIO: 2.4 (ref 1.1–2.5)
ALBUMIN: 4.1 g/dL (ref 3.5–4.7)
ALK PHOS: 54 IU/L (ref 39–117)
ALT: 12 IU/L (ref 0–32)
AST: 19 IU/L (ref 0–40)
BUN/Creatinine Ratio: 19 (ref 11–26)
BUN: 16 mg/dL (ref 8–27)
CO2: 27 mmol/L (ref 18–29)
Calcium: 9.4 mg/dL (ref 8.7–10.3)
Chloride: 103 mmol/L (ref 97–108)
Creatinine, Ser: 0.85 mg/dL (ref 0.57–1.00)
GFR calc non Af Amer: 64 mL/min/{1.73_m2} (ref >59)
GFR, EST AFRICAN AMERICAN: 74 mL/min/{1.73_m2} (ref >59)
Globulin, Total: 1.7 g/dL (ref 1.5–4.5)
Glucose: 119 mg/dL — ABNORMAL HIGH (ref 65–99)
Potassium: 4.5 mmol/L (ref 3.5–5.2)
Sodium: 144 mmol/L (ref 134–144)
Total Bilirubin: 0.8 mg/dL (ref 0.0–1.2)
Total Protein: 5.8 g/dL — ABNORMAL LOW (ref 6.0–8.5)

## 2013-07-10 ENCOUNTER — Other Ambulatory Visit: Payer: Self-pay | Admitting: Nurse Practitioner

## 2013-08-18 ENCOUNTER — Other Ambulatory Visit: Payer: Self-pay | Admitting: Nurse Practitioner

## 2013-08-29 ENCOUNTER — Other Ambulatory Visit: Payer: Self-pay | Admitting: Nurse Practitioner

## 2013-09-14 ENCOUNTER — Other Ambulatory Visit: Payer: Self-pay | Admitting: Family Medicine

## 2013-09-14 DIAGNOSIS — Z139 Encounter for screening, unspecified: Secondary | ICD-10-CM

## 2013-09-23 ENCOUNTER — Ambulatory Visit (INDEPENDENT_AMBULATORY_CARE_PROVIDER_SITE_OTHER): Payer: Medicare Other | Admitting: Nurse Practitioner

## 2013-09-23 ENCOUNTER — Encounter: Payer: Self-pay | Admitting: Nurse Practitioner

## 2013-09-23 VITALS — BP 142/88 | HR 72 | Temp 99.3°F | Ht 61.0 in | Wt 169.0 lb

## 2013-09-23 DIAGNOSIS — M8580 Other specified disorders of bone density and structure, unspecified site: Secondary | ICD-10-CM

## 2013-09-23 DIAGNOSIS — Z6831 Body mass index (BMI) 31.0-31.9, adult: Secondary | ICD-10-CM

## 2013-09-23 DIAGNOSIS — M899 Disorder of bone, unspecified: Secondary | ICD-10-CM

## 2013-09-23 DIAGNOSIS — I10 Essential (primary) hypertension: Secondary | ICD-10-CM

## 2013-09-23 DIAGNOSIS — E119 Type 2 diabetes mellitus without complications: Secondary | ICD-10-CM

## 2013-09-23 DIAGNOSIS — E785 Hyperlipidemia, unspecified: Secondary | ICD-10-CM

## 2013-09-23 DIAGNOSIS — K5730 Diverticulosis of large intestine without perforation or abscess without bleeding: Secondary | ICD-10-CM

## 2013-09-23 DIAGNOSIS — Z713 Dietary counseling and surveillance: Secondary | ICD-10-CM

## 2013-09-23 DIAGNOSIS — M949 Disorder of cartilage, unspecified: Secondary | ICD-10-CM

## 2013-09-23 LAB — POCT GLYCOSYLATED HEMOGLOBIN (HGB A1C): Hemoglobin A1C: 5.9

## 2013-09-23 NOTE — Progress Notes (Signed)
Subjective:    Patient ID: Sara Holmes, female    DOB: Jul 20, 1928, 80 y.o.   MRN: [id number]  Pt here for chronic medical follow up. No complaints today.  Diabetes She presents for her follow-up diabetic visit. She has type 2 diabetes mellitus. No MedicAlert identification noted. The initial diagnosis of diabetes was made 5 years ago. Her disease course has been stable. There are no hypoglycemic associated symptoms. Pertinent negatives for hypoglycemia include no headaches. Associated symptoms include foot paresthesias. Pertinent negatives for diabetes include no blurred vision, no chest pain and no fatigue. There are no hypoglycemic complications. Symptoms are stable. Diabetic complications include nephropathy (bil feet). Risk factors for coronary artery disease include dyslipidemia, hypertension, obesity and post-menopausal. Current diabetic treatment includes oral agent (monotherapy). She is compliant with treatment all of the time. Her weight is stable. She is following a generally healthy diet. When asked about meal planning, she reported none. She has not had a previous visit with a dietician. She rarely participates in exercise. There is no change in her home blood glucose trend. Her breakfast blood glucose is taken between 8-9 am. Her breakfast blood glucose range is generally 90-110 mg/dl. Her overall blood glucose range is 90-110 mg/dl. An ACE inhibitor/angiotensin II receptor blocker is being taken. She does not see a podiatrist.Eye exam is current (February 2014).  Hypertension This is a chronic problem. The current episode started more than 1 year ago. The problem is unchanged. The problem is controlled. Associated symptoms include peripheral edema. Pertinent negatives include no blurred vision, chest pain, headaches, palpitations or shortness of breath. There are no associated agents to hypertension. Risk factors for coronary artery disease include diabetes mellitus, dyslipidemia,  post-menopausal state and obesity. Past treatments include ACE inhibitors and diuretics. The current treatment provides significant improvement. There are no compliance problems.   Hyperlipidemia This is a chronic problem. The current episode started more than 1 year ago. The problem is controlled. Recent lipid tests were reviewed and are normal. Exacerbating diseases include diabetes and obesity. Pertinent negatives include no chest pain, leg pain, myalgias or shortness of breath. Current antihyperlipidemic treatment includes statins and ezetimibe. The current treatment provides significant improvement of lipids. Risk factors for coronary artery disease include diabetes mellitus, hypertension, post-menopausal and obesity.  Diverticulosis Patient just watches her diet- no recent flare ups.   Review of Systems  Constitutional: Negative.  Negative for fever, activity change and fatigue.  HENT: Negative for congestion, ear discharge and ear pain.   Eyes: Negative.  Negative for blurred vision and visual disturbance.  Respiratory: Negative.  Negative for shortness of breath.   Cardiovascular: Negative for chest pain and palpitations.  Gastrointestinal: Negative for abdominal pain, constipation and blood in stool.  Genitourinary: Negative.   Musculoskeletal: Negative.  Negative for joint swelling and myalgias.  Neurological: Negative for headaches.  All other systems reviewed and are negative.      Objective:   Physical Exam  Constitutional: She is oriented to person, place, and time. She appears well-developed and well-nourished.  HENT:  Nose: Nose normal.  Mouth/Throat: Oropharynx is clear and moist.  Small amount of cerumen removed from right ear with currette.  Eyes: EOM are normal.  Neck: Trachea normal, normal range of motion and full passive range of motion without pain. Neck supple. No JVD present. Carotid bruit is not present. No thyromegaly present.  Cardiovascular: Normal rate,  regular rhythm, normal heart sounds and intact distal pulses.  Exam reveals no gallop and no  friction rub.   No murmur heard. Pulmonary/Chest: Effort normal and breath sounds normal.  Abdominal: Soft. Bowel sounds are normal. She exhibits no distension and no mass. There is no tenderness.  Musculoskeletal: Normal range of motion.  Lymphadenopathy:    She has no cervical adenopathy.  Neurological: She is alert and oriented to person, place, and time. She has normal reflexes.  Skin: Skin is warm and dry.  Psychiatric: She has a normal mood and affect. Her behavior is normal. Judgment and thought content normal.    BP 142/88  Pulse 72  Temp(Src) 99.3 F (37.4 C) (Oral)  Ht '5\' 1"'  (1.549 m)  Wt 169 lb (76.658 kg)  BMI 31.95 kg/m2  Results for orders placed in visit on 09/23/13  POCT GLYCOSYLATED HEMOGLOBIN (HGB A1C)      Result Value Ref Range   Hemoglobin A1C 5.9            Assessment & Plan:   1. Type II or unspecified type diabetes mellitus without mention of complication, not stated as uncontrolled   2. Essential hypertension   3. Hyperlipidemia   4. Osteopenia   5. Diverticulosis of large intestine without hemorrhage   6. BMI 31.0-31.9,adult   7. Weight loss counseling, encounter for     Orders Placed This Encounter  Procedures  . CMP14+EGFR  . NMR, lipoprofile  . POCT glycosylated hemoglobin (Hb A1C)    Labs pending Health maintenance reviewed Diet and exercise encouraged Continue all meds Follow up  In 3 months   [place], FNP

## 2013-09-23 NOTE — Patient Instructions (Signed)

## 2013-09-24 LAB — CMP14+EGFR
ALK PHOS: 51 IU/L (ref 39–117)
ALT: 14 IU/L (ref 0–32)
AST: 17 IU/L (ref 0–40)
Albumin/Globulin Ratio: 2.9 — ABNORMAL HIGH (ref 1.1–2.5)
Albumin: 4.3 g/dL (ref 3.5–4.7)
BUN / CREAT RATIO: 19 (ref 11–26)
BUN: 16 mg/dL (ref 8–27)
CHLORIDE: 103 mmol/L (ref 97–108)
CO2: 26 mmol/L (ref 18–29)
Calcium: 9.1 mg/dL (ref 8.7–10.3)
Creatinine, Ser: 0.83 mg/dL (ref 0.57–1.00)
GFR calc Af Amer: 76 mL/min/{1.73_m2} (ref >59)
GFR calc non Af Amer: 66 mL/min/{1.73_m2} (ref >59)
GLOBULIN, TOTAL: 1.5 g/dL (ref 1.5–4.5)
Glucose: 103 mg/dL — ABNORMAL HIGH (ref 65–99)
Potassium: 4.6 mmol/L (ref 3.5–5.2)
SODIUM: 145 mmol/L — AB (ref 134–144)
Total Bilirubin: 0.7 mg/dL (ref 0.0–1.2)
Total Protein: 5.8 g/dL — ABNORMAL LOW (ref 6.0–8.5)

## 2013-09-24 LAB — NMR, LIPOPROFILE
CHOLESTEROL: 117 mg/dL (ref 100–199)
HDL Cholesterol by NMR: 50 mg/dL (ref >39)
HDL Particle Number: 37.8 umol/L (ref >=30.5)
LDL Particle Number: 736 nmol/L (ref <1000)
LDL Size: 20 nm (ref >20.5)
LDLC SERPL CALC-MCNC: 43 mg/dL (ref 0–99)
LP-IR SCORE: 54 — AB (ref <=45)
Small LDL Particle Number: 495 nmol/L (ref <=527)
TRIGLYCERIDES BY NMR: 121 mg/dL (ref 0–149)

## 2013-10-19 ENCOUNTER — Ambulatory Visit (HOSPITAL_COMMUNITY)
Admission: RE | Admit: 2013-10-19 | Discharge: 2013-10-19 | Disposition: A | Discharge status: Home / Self Care | Payer: Medicare Other | Source: Ambulatory Visit | Attending: Family Medicine | Admitting: Family Medicine

## 2013-10-19 DIAGNOSIS — N63 Unspecified lump in unspecified breast: Secondary | ICD-10-CM | POA: Diagnosis not present

## 2013-10-19 DIAGNOSIS — Z139 Encounter for screening, unspecified: Secondary | ICD-10-CM | POA: Diagnosis present

## 2013-10-19 DIAGNOSIS — Z1231 Encounter for screening mammogram for malignant neoplasm of breast: Secondary | ICD-10-CM | POA: Insufficient documentation

## 2013-10-21 ENCOUNTER — Other Ambulatory Visit: Payer: Self-pay | Admitting: Nurse Practitioner

## 2013-10-21 ENCOUNTER — Telehealth: Payer: Self-pay

## 2013-10-21 ENCOUNTER — Other Ambulatory Visit: Payer: Self-pay | Admitting: Family Medicine

## 2013-10-21 DIAGNOSIS — R928 Other abnormal and inconclusive findings on diagnostic imaging of breast: Secondary | ICD-10-CM

## 2013-10-21 DIAGNOSIS — N6310 Unspecified lump in the right breast, unspecified quadrant: Secondary | ICD-10-CM

## 2013-10-21 NOTE — Telephone Encounter (Signed)
Message copied by Koren Bound on Wed Oct 21, 2013 10:21 AM ------      Message from: Chipper Herb      Created: Tue Oct 20, 2013  2:29 PM       As per radiology report----please make sure that this ultrasound gets done for this patient ------

## 2013-10-21 NOTE — Telephone Encounter (Signed)
Pt aware of results and Dx mammogram appointment of 11/04/10  11:20 at [facility] ([place])

## 2013-10-24 ENCOUNTER — Other Ambulatory Visit: Payer: Self-pay | Admitting: Nurse Practitioner

## 2013-11-03 ENCOUNTER — Ambulatory Visit (HOSPITAL_COMMUNITY)
Admission: RE | Admit: 2013-11-03 | Discharge: 2013-11-03 | Disposition: A | Discharge status: Home / Self Care | Payer: Medicare Other | Source: Ambulatory Visit | Attending: Family Medicine | Admitting: Family Medicine

## 2013-11-03 DIAGNOSIS — N6489 Other specified disorders of breast: Secondary | ICD-10-CM | POA: Diagnosis not present

## 2013-11-03 DIAGNOSIS — N6310 Unspecified lump in the right breast, unspecified quadrant: Secondary | ICD-10-CM

## 2013-11-03 DIAGNOSIS — R928 Other abnormal and inconclusive findings on diagnostic imaging of breast: Secondary | ICD-10-CM | POA: Diagnosis present

## 2013-11-27 LAB — HM DIABETES EYE EXAM

## 2014-01-01 ENCOUNTER — Ambulatory Visit (INDEPENDENT_AMBULATORY_CARE_PROVIDER_SITE_OTHER): Payer: Medicare Other | Admitting: Nurse Practitioner

## 2014-01-01 ENCOUNTER — Encounter: Payer: Self-pay | Admitting: Nurse Practitioner

## 2014-01-01 VITALS — BP 130/75 | HR 61 | Temp 97.1°F | Ht 61.0 in | Wt 166.6 lb

## 2014-01-01 DIAGNOSIS — E1110 Type 2 diabetes mellitus with ketoacidosis without coma: Secondary | ICD-10-CM

## 2014-01-01 DIAGNOSIS — E785 Hyperlipidemia, unspecified: Secondary | ICD-10-CM

## 2014-01-01 DIAGNOSIS — I10 Essential (primary) hypertension: Secondary | ICD-10-CM

## 2014-01-01 DIAGNOSIS — Z6831 Body mass index (BMI) 31.0-31.9, adult: Secondary | ICD-10-CM

## 2014-01-01 DIAGNOSIS — E1310 Other specified diabetes mellitus with ketoacidosis without coma: Secondary | ICD-10-CM

## 2014-01-01 DIAGNOSIS — K5730 Diverticulosis of large intestine without perforation or abscess without bleeding: Secondary | ICD-10-CM

## 2014-01-01 DIAGNOSIS — M8580 Other specified disorders of bone density and structure, unspecified site: Secondary | ICD-10-CM

## 2014-01-01 DIAGNOSIS — H409 Unspecified glaucoma: Secondary | ICD-10-CM

## 2014-01-01 DIAGNOSIS — Z23 Encounter for immunization: Secondary | ICD-10-CM

## 2014-01-01 LAB — POCT GLYCOSYLATED HEMOGLOBIN (HGB A1C): Hemoglobin A1C: 6.1

## 2014-01-01 LAB — POCT UA - MICROALBUMIN: Microalbumin Ur, POC: 20 mg/L

## 2014-01-01 NOTE — Addendum Note (Signed)
Addended by: Earlene Plater on: 01/01/2014 09:43 AM   Modules accepted: Orders

## 2014-01-01 NOTE — Patient Instructions (Signed)

## 2014-01-01 NOTE — Progress Notes (Signed)
Subjective:    Patient ID: Sara Holmes, female    DOB: [birth year], 80 y.o.   MRN: [id number]  Pt here for chronic medical follow up. No complaints today.  Diabetes She presents for her follow-up diabetic visit. She has type 2 diabetes mellitus. No MedicAlert identification noted. The initial diagnosis of diabetes was made 5 years ago. Her disease course has been stable. There are no hypoglycemic associated symptoms. Pertinent negatives for hypoglycemia include no headaches. Associated symptoms include foot paresthesias. Pertinent negatives for diabetes include no blurred vision, no chest pain and no fatigue. There are no hypoglycemic complications. Symptoms are stable. Diabetic complications include nephropathy (bil feet). Risk factors for coronary artery disease include dyslipidemia, hypertension, obesity and post-menopausal. Current diabetic treatment includes oral agent (monotherapy). She is compliant with treatment all of the time. Her weight is stable. She is following a generally healthy diet. When asked about meal planning, she reported none. She has not had a previous visit with a dietician. She rarely participates in exercise. There is no change in her home blood glucose trend. Her breakfast blood glucose is taken between 8-9 am. Her breakfast blood glucose range is generally 90-110 mg/dl. Her overall blood glucose range is 90-110 mg/dl. An ACE inhibitor/angiotensin II receptor blocker is being taken. She does not see a podiatrist.Eye exam is current (February 2014).  Hypertension This is a chronic problem. The current episode started more than 1 year ago. The problem is unchanged. The problem is controlled. Associated symptoms include peripheral edema. Pertinent negatives include no blurred vision, chest pain, headaches, palpitations or shortness of breath. There are no associated agents to hypertension. Risk factors for coronary artery disease include diabetes mellitus, dyslipidemia,  post-menopausal state and obesity. Past treatments include ACE inhibitors and diuretics. The current treatment provides significant improvement. There are no compliance problems.   Hyperlipidemia This is a chronic problem. The current episode started more than 1 year ago. The problem is controlled. Recent lipid tests were reviewed and are normal. Exacerbating diseases include diabetes and obesity. Pertinent negatives include no chest pain, leg pain, myalgias or shortness of breath. Current antihyperlipidemic treatment includes statins and ezetimibe. The current treatment provides significant improvement of lipids. Risk factors for coronary artery disease include diabetes mellitus, hypertension, post-menopausal and obesity.  Diverticulosis Patient just watches her diet- no recent flare ups. glaucoma Goes 1x a year and sometimes more often     Review of Systems  Constitutional: Negative.  Negative for fever, activity change and fatigue.  HENT: Negative for congestion, ear discharge and ear pain.   Eyes: Negative.  Negative for blurred vision and visual disturbance.  Respiratory: Negative.  Negative for shortness of breath.   Cardiovascular: Negative for chest pain and palpitations.  Gastrointestinal: Negative for abdominal pain, constipation and blood in stool.  Genitourinary: Negative.   Musculoskeletal: Negative.  Negative for joint swelling and myalgias.  Neurological: Negative for headaches.  All other systems reviewed and are negative.      Objective:   Physical Exam  Constitutional: She is oriented to person, place, and time. She appears well-developed and well-nourished.  HENT:  Nose: Nose normal.  Mouth/Throat: Oropharynx is clear and moist.  Small amount of cerumen removed from right ear with currette.  Eyes: EOM are normal.  Neck: Trachea normal, normal range of motion and full passive range of motion without pain. Neck supple. No JVD present. Carotid bruit is not present. No  thyromegaly present.  Cardiovascular: Normal rate, regular rhythm, normal heart sounds  and intact distal pulses.  Exam reveals no gallop and no friction rub.   No murmur heard. Pulmonary/Chest: Effort normal and breath sounds normal.  Abdominal: Soft. Bowel sounds are normal. She exhibits no distension and no mass. There is no tenderness.  Musculoskeletal: Normal range of motion.  Lymphadenopathy:    She has no cervical adenopathy.  Neurological: She is alert and oriented to person, place, and time. She has normal reflexes.  Skin: Skin is warm and dry.  Psychiatric: She has a normal mood and affect. Her behavior is normal. Judgment and thought content normal.    BP 130/75  Pulse 61  Temp(Src) 97.1 F (36.2 C) (Oral)  Ht '5\' 1"'  (1.549 m)  Wt 166 lb 9.6 oz (75.569 kg)  BMI 31.49 kg/m2  Results for orders placed in visit on 01/01/14  POCT GLYCOSYLATED HEMOGLOBIN (HGB A1C)      Result Value Ref Range   Hemoglobin A1C 6.1            Assessment & Plan:   1. Hyperlipidemia Low fat diet - NMR, lipoprofile  2. Essential hypertension Low NA+diet - CMP14+EGFR  3. Osteopenia Weight bearing exercises  4. DM (diabetes mellitus) type 2, uncontrolled, with ketoacidosis Watch carbs in diet - POCT glycosylated hemoglobin (Hb A1C) - POCT UA - Microalbumin  5. Encounter for immunization Flu shot today  6. Glaucoma Keep appointment with eye doctor  7. BMI 31.0-31.9,adult Discussed diet and exercise for person with BMI >25 Will recheck weight in 3-6 months   8. Diverticulosis of large intestine without hemorrhage Watch foods with small seeds    Labs pending Health maintenance reviewed Diet and exercise encouraged Continue all meds Follow up  In 3 month   [place], FNP

## 2014-01-02 LAB — NMR, LIPOPROFILE
CHOLESTEROL: 141 mg/dL (ref 100–199)
HDL Cholesterol by NMR: 59 mg/dL (ref >39)
HDL PARTICLE NUMBER: 44.9 umol/L (ref >=30.5)
LDL Particle Number: 996 nmol/L (ref <1000)
LDL Size: 20.4 nm (ref >20.5)
LDLC SERPL CALC-MCNC: 53 mg/dL (ref 0–99)
LP-IR SCORE: 46 — AB (ref <=45)
Small LDL Particle Number: 661 nmol/L — ABNORMAL HIGH (ref <=527)
Triglycerides by NMR: 146 mg/dL (ref 0–149)

## 2014-01-02 LAB — MICROALBUMIN, URINE: Microalbumin, Urine: 15.8 ug/mL (ref 0.0–17.0)

## 2014-01-02 LAB — CMP14+EGFR
A/G RATIO: 2.6 — AB (ref 1.1–2.5)
ALK PHOS: 56 IU/L (ref 39–117)
ALT: 13 IU/L (ref 0–32)
AST: 18 IU/L (ref 0–40)
Albumin: 4.5 g/dL (ref 3.5–4.7)
BUN / CREAT RATIO: 21 (ref 11–26)
BUN: 24 mg/dL (ref 8–27)
CO2: 24 mmol/L (ref 18–29)
CREATININE: 1.16 mg/dL — AB (ref 0.57–1.00)
Calcium: 9.9 mg/dL (ref 8.7–10.3)
Chloride: 101 mmol/L (ref 97–108)
GFR calc non Af Amer: 44 mL/min/{1.73_m2} — ABNORMAL LOW (ref >59)
GFR, EST AFRICAN AMERICAN: 51 mL/min/{1.73_m2} — AB (ref >59)
GLOBULIN, TOTAL: 1.7 g/dL (ref 1.5–4.5)
Glucose: 135 mg/dL — ABNORMAL HIGH (ref 65–99)
Potassium: 5 mmol/L (ref 3.5–5.2)
SODIUM: 144 mmol/L (ref 134–144)
Total Bilirubin: 0.7 mg/dL (ref 0.0–1.2)
Total Protein: 6.2 g/dL (ref 6.0–8.5)

## 2014-01-04 ENCOUNTER — Other Ambulatory Visit: Payer: Self-pay | Admitting: Nurse Practitioner

## 2014-01-05 ENCOUNTER — Telehealth: Payer: Self-pay | Admitting: Family Medicine

## 2014-01-05 NOTE — Telephone Encounter (Signed)
Message copied by Waverly Ferrari on Tue Jan 05, 2014  9:32 AM ------      Message from: Chevis Pretty      Created: Mon Jan 04, 2014  2:53 PM       mircro albumin good      Hgba1c discussed at appointment      Kidney and liver function stable- creatine up a little- no NSAIDS      Cholesterol looks great      Continue current meds- low fat diet and exercise and recheck in 3 months             ------

## 2014-01-05 NOTE — Telephone Encounter (Signed)
Patient aware.

## 2014-01-06 ENCOUNTER — Other Ambulatory Visit: Payer: Self-pay | Admitting: Nurse Practitioner

## 2014-01-25 ENCOUNTER — Other Ambulatory Visit: Payer: Self-pay | Admitting: Nurse Practitioner

## 2014-01-30 ENCOUNTER — Other Ambulatory Visit: Payer: Self-pay | Admitting: Nurse Practitioner

## 2014-03-02 ENCOUNTER — Other Ambulatory Visit: Payer: Self-pay | Admitting: Nurse Practitioner

## 2014-03-19 ENCOUNTER — Encounter: Payer: Self-pay | Admitting: *Deleted

## 2014-04-12 ENCOUNTER — Ambulatory Visit (INDEPENDENT_AMBULATORY_CARE_PROVIDER_SITE_OTHER): Payer: PPO | Admitting: Nurse Practitioner

## 2014-04-12 ENCOUNTER — Encounter: Payer: Self-pay | Admitting: Nurse Practitioner

## 2014-04-12 VITALS — BP 133/75 | HR 63 | Temp 98.6°F | Ht 61.0 in | Wt 171.6 lb

## 2014-04-12 DIAGNOSIS — E1110 Type 2 diabetes mellitus with ketoacidosis without coma: Secondary | ICD-10-CM

## 2014-04-12 DIAGNOSIS — M1000 Idiopathic gout, unspecified site: Secondary | ICD-10-CM | POA: Insufficient documentation

## 2014-04-12 DIAGNOSIS — E1310 Other specified diabetes mellitus with ketoacidosis without coma: Secondary | ICD-10-CM

## 2014-04-12 DIAGNOSIS — E1142 Type 2 diabetes mellitus with diabetic polyneuropathy: Secondary | ICD-10-CM | POA: Insufficient documentation

## 2014-04-12 DIAGNOSIS — E785 Hyperlipidemia, unspecified: Secondary | ICD-10-CM

## 2014-04-12 DIAGNOSIS — I10 Essential (primary) hypertension: Secondary | ICD-10-CM

## 2014-04-12 DIAGNOSIS — M10079 Idiopathic gout, unspecified ankle and foot: Secondary | ICD-10-CM

## 2014-04-12 LAB — POCT GLYCOSYLATED HEMOGLOBIN (HGB A1C): Hemoglobin A1C: 6.7

## 2014-04-12 MED ORDER — ALLOPURINOL 100 MG PO TABS: 100.0000 mg | ORAL_TABLET | Freq: Every day | ORAL | Status: DC

## 2014-04-12 MED ORDER — GABAPENTIN 300 MG PO CAPS: 300.0000 mg | ORAL_CAPSULE | Freq: Two times a day (BID) | ORAL | Status: DC

## 2014-04-12 MED ORDER — METFORMIN HCL 1000 MG PO TABS: ORAL_TABLET | ORAL | Status: DC

## 2014-04-12 NOTE — Patient Instructions (Signed)

## 2014-04-12 NOTE — Progress Notes (Signed)
Subjective:    Patient ID: Sara Holmes, female    DOB: 1929-03-02, 81 y.o.   MRN: [id number]  Pt here for chronic medical follow up. No complaints today.  Diabetes She presents for her follow-up diabetic visit. She has type 2 diabetes mellitus. No MedicAlert identification noted. Her disease course has been stable. Pertinent negatives for hypoglycemia include no headaches. Pertinent negatives for diabetes include no chest pain and no fatigue. There are no hypoglycemic complications. Symptoms are worsening. There are no diabetic complications. Risk factors for coronary artery disease include diabetes mellitus, dyslipidemia, hypertension, obesity and post-menopausal. She is compliant with treatment none of the time. Her weight is stable. She is following a diabetic diet. When asked about meal planning, she reported none. She has not had a previous visit with a dietitian. She rarely participates in exercise. Her breakfast blood glucose is taken between 8-9 am. Her breakfast blood glucose range is generally 110-130 mg/dl. Her overall blood glucose range is 110-130 mg/dl. An ACE inhibitor/angiotensin II receptor blocker is being taken. She does not see a podiatrist.Eye exam is current.  Hypertension This is a chronic problem. The current episode started more than 1 year ago. The problem is unchanged. Pertinent negatives include no chest pain, headaches, palpitations or shortness of breath. Risk factors for coronary artery disease include diabetes mellitus, dyslipidemia, post-menopausal state and obesity. Past treatments include ACE inhibitors and diuretics. The current treatment provides moderate improvement. Compliance problems include diet and exercise.   Hyperlipidemia This is a chronic problem. The current episode started more than 1 year ago. The problem is controlled. Exacerbating diseases include diabetes and obesity. She has no history of hypothyroidism. Factors aggravating her hyperlipidemia  include thiazides. Pertinent negatives include no chest pain, myalgias or shortness of breath. Current antihyperlipidemic treatment includes statins. The current treatment provides moderate improvement of lipids. There are no compliance problems.  Risk factors for coronary artery disease include diabetes mellitus, dyslipidemia, hypertension, obesity and post-menopausal.  Diverticulosis Patient just watches her diet- no recent flare ups. glaucoma Goes 1x a year and sometimes more often GOUT Has not been taling allopurinol in awhile- had flare up about 3 months ago- wold like to go back on meds.   Review of Systems  Constitutional: Negative.  Negative for fever, activity change and fatigue.  HENT: Negative for congestion, ear discharge and ear pain.   Eyes: Negative.  Negative for visual disturbance.  Respiratory: Negative.  Negative for shortness of breath.   Cardiovascular: Negative for chest pain and palpitations.  Gastrointestinal: Negative for abdominal pain, constipation and blood in stool.  Genitourinary: Negative.   Musculoskeletal: Negative.  Negative for myalgias and joint swelling.  Neurological: Negative for headaches.  All other systems reviewed and are negative.      Objective:   Physical Exam  Constitutional: She is oriented to person, place, and time. She appears well-developed and well-nourished.  HENT:  Nose: Nose normal.  Mouth/Throat: Oropharynx is clear and moist.  Small amount of cerumen removed from right ear with currette.  Eyes: EOM are normal.  Neck: Trachea normal, normal range of motion and full passive range of motion without pain. Neck supple. No JVD present. Carotid bruit is not present. No thyromegaly present.  Cardiovascular: Normal rate, regular rhythm, normal heart sounds and intact distal pulses.  Exam reveals no gallop and no friction rub.   No murmur heard. Pulmonary/Chest: Effort normal and breath sounds normal.  Abdominal: Soft. Bowel sounds  are normal. She exhibits no distension  and no mass. There is no tenderness.  Musculoskeletal: Normal range of motion.  Lymphadenopathy:    She has no cervical adenopathy.  Neurological: She is alert and oriented to person, place, and time. She has normal reflexes.  Skin: Skin is warm and dry.  Psychiatric: She has a normal mood and affect. Her behavior is normal. Judgment and thought content normal.    BP 133/75 mmHg  Pulse 63  Temp(Src) 98.6 F (37 C) (Oral)  Ht '5\' 1"'  (1.549 m)  Wt 171 lb 9.6 oz (77.837 kg)  BMI 32.44 kg/m2  Results for orders placed or performed in visit on 04/12/14  POCT glycosylated hemoglobin (Hb A1C)  Result Value Ref Range   Hemoglobin A1C 6.7%           Assessment & Plan:   1. Hyperlipidemia Low fat diet - NMR, lipoprofile  2. Essential hypertension Do not add slat to diet - CMP14+EGFR  3. DM (diabetes mellitus) type 2, uncontrolled, with ketoacidosis Continue carb counting - POCT glycosylated hemoglobin (Hb A1C)  4. Diabetic polyneuropathy associated with type 2 diabetes mellitus  5. Acute idiopathic gout of foot, unspecified laterality Added allopurinol back to medications  Meds ordered this encounter  Medications  . gabapentin (NEURONTIN) 300 MG capsule    Sig: Take 1 capsule (300 mg total) by mouth 2 (two) times daily.    Dispense:  60 capsule    Refill:  2    Order Specific Question:  Supervising Provider    Answer:  Chipper Herb [[id number]]  . metFORMIN (GLUCOPHAGE) 1000 MG tablet    Sig: TAKE 1 TABLET BY MOUTH in the am and 0.5 tablet by mouthe every pm    Dispense:  60 tablet    Refill:  3    Order Specific Question:  Supervising Provider    Answer:  Chipper Herb [[id number]]  . allopurinol (ZYLOPRIM) 100 MG tablet    Sig: Take 1 tablet (100 mg total) by mouth daily.    Dispense:  30 tablet    Refill:  6    Order Specific Question:  Supervising Provider    Answer:  Chipper Herb [[id number]]    Labs pending Health  maintenance reviewed Diet and exercise encouraged Continue all meds Follow up  In 3 months   [place], FNP

## 2014-04-13 LAB — NMR, LIPOPROFILE
Cholesterol: 121 mg/dL (ref 100–199)
HDL Cholesterol by NMR: 56 mg/dL (ref >39)
HDL Particle Number: 44.3 umol/L (ref >=30.5)
LDL PARTICLE NUMBER: 881 nmol/L (ref <1000)
LDL Size: 19.7 nm (ref >20.5)
LDL-C: 47 mg/dL (ref 0–99)
LP-IR Score: 53 — ABNORMAL HIGH (ref <=45)
SMALL LDL PARTICLE NUMBER: 729 nmol/L — AB (ref <=527)
Triglycerides by NMR: 90 mg/dL (ref 0–149)

## 2014-04-13 LAB — CMP14+EGFR
ALBUMIN: 4.1 g/dL (ref 3.5–4.7)
ALT: 12 IU/L (ref 0–32)
AST: 19 IU/L (ref 0–40)
Albumin/Globulin Ratio: 2.3 (ref 1.1–2.5)
Alkaline Phosphatase: 56 IU/L (ref 39–117)
BUN/Creatinine Ratio: 18 (ref 11–26)
BUN: 18 mg/dL (ref 8–27)
CO2: 29 mmol/L (ref 18–29)
Calcium: 9.5 mg/dL (ref 8.7–10.3)
Chloride: 101 mmol/L (ref 97–108)
Creatinine, Ser: 1.01 mg/dL — ABNORMAL HIGH (ref 0.57–1.00)
GFR calc Af Amer: 59 mL/min/{1.73_m2} — ABNORMAL LOW (ref >59)
GFR calc non Af Amer: 52 mL/min/{1.73_m2} — ABNORMAL LOW (ref >59)
GLUCOSE: 110 mg/dL — AB (ref 65–99)
Globulin, Total: 1.8 g/dL (ref 1.5–4.5)
Potassium: 4.2 mmol/L (ref 3.5–5.2)
Sodium: 144 mmol/L (ref 134–144)
Total Bilirubin: 0.6 mg/dL (ref 0.0–1.2)
Total Protein: 5.9 g/dL — ABNORMAL LOW (ref 6.0–8.5)

## 2014-04-14 ENCOUNTER — Ambulatory Visit: Payer: Medicare Other | Admitting: Nurse Practitioner

## 2014-05-15 ENCOUNTER — Other Ambulatory Visit: Payer: Self-pay | Admitting: Nurse Practitioner

## 2014-07-26 ENCOUNTER — Encounter: Payer: Self-pay | Admitting: Nurse Practitioner

## 2014-07-26 ENCOUNTER — Ambulatory Visit (INDEPENDENT_AMBULATORY_CARE_PROVIDER_SITE_OTHER): Payer: PPO | Admitting: Nurse Practitioner

## 2014-07-26 VITALS — BP 133/69 | HR 63 | Temp 96.9°F | Ht 61.0 in | Wt 170.0 lb

## 2014-07-26 DIAGNOSIS — E785 Hyperlipidemia, unspecified: Secondary | ICD-10-CM

## 2014-07-26 DIAGNOSIS — I10 Essential (primary) hypertension: Secondary | ICD-10-CM | POA: Diagnosis not present

## 2014-07-26 DIAGNOSIS — M8580 Other specified disorders of bone density and structure, unspecified site: Secondary | ICD-10-CM | POA: Diagnosis not present

## 2014-07-26 DIAGNOSIS — M1A09X0 Idiopathic chronic gout, multiple sites, without tophus (tophi): Secondary | ICD-10-CM | POA: Diagnosis not present

## 2014-07-26 DIAGNOSIS — Z6831 Body mass index (BMI) 31.0-31.9, adult: Secondary | ICD-10-CM | POA: Diagnosis not present

## 2014-07-26 DIAGNOSIS — E1110 Type 2 diabetes mellitus with ketoacidosis without coma: Secondary | ICD-10-CM

## 2014-07-26 DIAGNOSIS — K5730 Diverticulosis of large intestine without perforation or abscess without bleeding: Secondary | ICD-10-CM

## 2014-07-26 DIAGNOSIS — E1310 Other specified diabetes mellitus with ketoacidosis without coma: Secondary | ICD-10-CM

## 2014-07-26 DIAGNOSIS — E1142 Type 2 diabetes mellitus with diabetic polyneuropathy: Secondary | ICD-10-CM | POA: Diagnosis not present

## 2014-07-26 LAB — POCT GLYCOSYLATED HEMOGLOBIN (HGB A1C): HEMOGLOBIN A1C: 6.4

## 2014-07-26 MED ORDER — ROSUVASTATIN CALCIUM 10 MG PO TABS: 10.0000 mg | ORAL_TABLET | Freq: Every day | ORAL | Status: DC

## 2014-07-26 MED ORDER — RALOXIFENE HCL 60 MG PO TABS: 60.0000 mg | ORAL_TABLET | Freq: Every day | ORAL | Status: DC

## 2014-07-26 MED ORDER — GABAPENTIN 300 MG PO CAPS: 300.0000 mg | ORAL_CAPSULE | Freq: Every day | ORAL | Status: DC

## 2014-07-26 MED ORDER — ALLOPURINOL 100 MG PO TABS: 100.0000 mg | ORAL_TABLET | Freq: Every day | ORAL | Status: DC

## 2014-07-26 MED ORDER — METFORMIN HCL 1000 MG PO TABS: ORAL_TABLET | ORAL | Status: DC

## 2014-07-26 MED ORDER — FOSINOPRIL SODIUM 40 MG PO TABS: ORAL_TABLET | ORAL | Status: DC

## 2014-07-26 MED ORDER — FELODIPINE ER 2.5 MG PO TB24: ORAL_TABLET | ORAL | Status: DC

## 2014-07-26 MED ORDER — TRIAMTERENE-HCTZ 37.5-25 MG PO CAPS: ORAL_CAPSULE | ORAL | Status: DC

## 2014-07-26 NOTE — Progress Notes (Signed)
Subjective:    Patient ID: [name], female    DOB: 06-May-1928, 81 y.o.   MRN: [id number]  Pt here for chronic medical follow up. No complaints today.  Diabetes She presents for her follow-up diabetic visit. She has type 2 diabetes mellitus. No MedicAlert identification noted. Her disease course has been stable. Pertinent negatives for hypoglycemia include no headaches. Pertinent negatives for diabetes include no chest pain and no fatigue. There are no hypoglycemic complications. Symptoms are worsening. There are no diabetic complications. Risk factors for coronary artery disease include diabetes mellitus, dyslipidemia, hypertension, obesity and post-menopausal. She is compliant with treatment none of the time. Her weight is stable. She is following a diabetic diet. When asked about meal planning, she reported none. She has not had a previous visit with a dietitian. She rarely participates in exercise. Her breakfast blood glucose is taken between 8-9 am. Her breakfast blood glucose range is generally 110-130 mg/dl. Her overall blood glucose range is 110-130 mg/dl. An ACE inhibitor/angiotensin II receptor blocker is being taken. She does not see a podiatrist.Eye exam is current.  Hypertension This is a chronic problem. The current episode started more than 1 year ago. The problem is unchanged. Pertinent negatives include no chest pain, headaches, palpitations or shortness of breath. Risk factors for coronary artery disease include diabetes mellitus, dyslipidemia, post-menopausal state and obesity. Past treatments include ACE inhibitors and diuretics. The current treatment provides moderate improvement. Compliance problems include diet and exercise.   Hyperlipidemia This is a chronic problem. The current episode started more than 1 year ago. The problem is controlled. Exacerbating diseases include diabetes and obesity. She has no history of hypothyroidism. Factors aggravating her hyperlipidemia  include thiazides. Pertinent negatives include no chest pain, myalgias or shortness of breath. Current antihyperlipidemic treatment includes statins. The current treatment provides moderate improvement of lipids. There are no compliance problems.  Risk factors for coronary artery disease include diabetes mellitus, dyslipidemia, hypertension, obesity and post-menopausal.  Diverticulosis Patient just watches her diet- no recent flare ups. glaucoma Goes 1x a year and sometimes more often GOUT Has not been taling allopurinol in awhile- had flare up about 3 months ago- wold like to go back on meds.   Review of Systems  Constitutional: Negative.  Negative for fever, activity change and fatigue.  HENT: Negative for congestion, ear discharge and ear pain.   Eyes: Negative.  Negative for visual disturbance.  Respiratory: Negative.  Negative for shortness of breath.   Cardiovascular: Negative for chest pain and palpitations.  Gastrointestinal: Negative for abdominal pain, constipation and blood in stool.  Genitourinary: Negative.   Musculoskeletal: Negative.  Negative for myalgias and joint swelling.  Neurological: Negative for headaches.  All other systems reviewed and are negative.      Objective:   Physical Exam  Constitutional: She is oriented to person, place, and time. She appears well-developed and well-nourished.  HENT:  Nose: Nose normal.  Mouth/Throat: Oropharynx is clear and moist.  Small amount of cerumen removed from right ear with currette.  Eyes: EOM are normal.  Neck: Trachea normal, normal range of motion and full passive range of motion without pain. Neck supple. No JVD present. Carotid bruit is not present. No thyromegaly present.  Cardiovascular: Normal rate, regular rhythm, normal heart sounds and intact distal pulses.  Exam reveals no gallop and no friction rub.   No murmur heard. Pulmonary/Chest: Effort normal and breath sounds normal.  Abdominal: Soft. Bowel sounds  are normal. She exhibits no distension  and no mass. There is no tenderness.  Musculoskeletal: Normal range of motion.  Lymphadenopathy:    She has no cervical adenopathy.  Neurological: She is alert and oriented to person, place, and time. She has normal reflexes.  Skin: Skin is warm and dry.  Psychiatric: She has a normal mood and affect. Her behavior is normal. Judgment and thought content normal.    BP 133/69 mmHg  Pulse 63  Temp(Src) 96.9 F (36.1 C) (Oral)  Ht '5\' 1"'  (1.549 m)  Wt 170 lb (77.111 kg)  BMI 32.14 kg/m2  Results for orders placed or performed in visit on 07/26/14  POCT glycosylated hemoglobin (Hb A1C)  Result Value Ref Range   Hemoglobin A1C 6.4           Assessment & Plan:  1. Hyperlipidemia Low fat diet - NMR, lipoprofile - rosuvastatin (CRESTOR) 10 MG tablet; Take 1 tablet (10 mg total) by mouth daily.  Dispense: 30 tablet; Refill: 5  2. Essential hypertension Do not add salt to diet - CMP14+EGFR - triamterene-hydrochlorothiazide (DYAZIDE) 37.5-25 MG per capsule; TAKE 1 CAPSULE BY MOUTH EVERY DAY  Dispense: 30 capsule; Refill: 5 - fosinopril (MONOPRIL) 40 MG tablet; TAKE 1 TABLET (40 MG TOTAL) BY MOUTH DAILY.  Dispense: 30 tablet; Refill: 5 - felodipine (PLENDIL) 2.5 MG 24 hr tablet; TAKE 1 TABLET (2.5 MG TOTAL) BY MOUTH DAILY.  Dispense: 30 tablet; Refill: 5  3. Diverticulosis of large intestine without hemorrhage Watch diet  4. Idiopathic chronic gout of multiple sites without tophus Low purine diet - allopurinol (ZYLOPRIM) 100 MG tablet; Take 1 tablet (100 mg total) by mouth daily.  Dispense: 30 tablet; Refill: 6  5. Diabetic polyneuropathy associated with type 2 diabetes mellitus Crab counting Do not go bear footed - POCT glycosylated hemoglobin (Hb A1C) - gabapentin (NEURONTIN) 300 MG capsule; Take 1 capsule (300 mg total) by mouth daily.  Dispense: 30 capsule; Refill: 5 - metFORMIN (GLUCOPHAGE) 1000 MG tablet; TAKE 1 TABLET BY MOUTH in the  am and 0.5 tablet by mouthe every pm  Dispense: 60 tablet; Refill: 5  6. BMI 31.0-31.9,adult Discussed diet and exercise for person with BMI >25 Will recheck weight in 3-6 months  7. Osteopenia Weigh bearing exercises - raloxifene (EVISTA) 60 MG tablet; Take 1 tablet (60 mg total) by mouth daily.  Dispense: 30 tablet; Refill: 5   prevnar 13 today Labs pending Health maintenance reviewed Diet and exercise encouraged Continue all meds Follow up  In 3 month   [place], FNP

## 2014-07-26 NOTE — Patient Instructions (Signed)
Bone Health Our bones do many things. They provide structure, protect organs, anchor muscles, and store calcium. Adequate calcium in your diet and weight-bearing physical activity help build strong bones, improve bone amounts, and may reduce the risk of weakening of bones (osteoporosis) later in life. PEAK BONE MASS By age 79, the average woman has acquired most of her skeletal bone mass. A large decline occurs in older adults which increases the risk of osteoporosis. In women this occurs around the time of menopause. It is important for young girls to reach their peak bone mass in order to maintain bone health throughout life. A person with high bone mass as a young adult will be more likely to have a higher bone mass later in life. Not enough calcium consumption and physical activity early on could result in a failure to achieve optimum bone mass in adulthood. OSTEOPOROSIS Osteoporosis is a disease of the bones. It is defined as low bone mass with deterioration of bone structure. Osteoporosis leads to an increase risk of fractures with falls. These fractures commonly happen in the wrist, hip, and spine. While men and women of all ages and background can develop osteoporosis, some of the risk factors for osteoporosis are:  Female.  White.  Postmenopausal.  Older adults.  Small in body size.  Eating a diet low in calcium.  Physically inactive.  Smoking.  Use of some medications.  Family history. CALCIUM Calcium is a mineral needed by the body for healthy bones, teeth, and proper function of the heart, muscles, and nerves. The body cannot produce calcium so it must be absorbed through food. Good sources of calcium include:  Dairy products (low fat or nonfat milk, cheese, and yogurt).  Dark green leafy vegetables (bok choy and broccoli).  Calcium fortified foods (orange juice, cereal, bread, soy beverages, and tofu products).  Nuts (almonds). Recommended amounts of calcium vary  for individuals. RECOMMENDED CALCIUM INTAKES Age and Amount in mg per day  Children 1 to 3 years / 700 mg  Children 4 to 8 years / 1,000 mg  Children 9 to 13 years / 1,300 mg  Teens 14 to 18 years / 1,300 mg  Adults 19 to 50 years / 1,000 mg  Adult women 51 to 70 years / 1,200 mg  Adults 71 years and older / 1,200 mg  Pregnant and breastfeeding teens / 1,300 mg  Pregnant and breastfeeding adults / 1,000 mg Vitamin D also plays an important role in healthy bone development. Vitamin D helps in the absorption of calcium. WEIGHT-BEARING PHYSICAL ACTIVITY Regular physical activity has many positive health benefits. Benefits include strong bones. Weight-bearing physical activity early in life is important in reaching peak bone mass. Weight-bearing physical activities cause muscles and bones to work against gravity. Some examples of weight bearing physical activities include:  Walking, jogging, or running.  Field Hockey.  Jumping rope.  Dancing.  Soccer.  Tennis or Racquetball.  Stair climbing.  Basketball.  Hiking.  Weight lifting.  Aerobic fitness classes. Including weight-bearing physical activity into an exercise plan is a great way to keep bones healthy. Adults: Engage in at least 30 minutes of moderate physical activity on most, preferably all, days of the week. Children: Engage in at least 60 minutes of moderate physical activity on most, preferably all, days of the week. FOR MORE INFORMATION [place] Department of Agriculture, Center for Nutrition Policy and Promotion: www.cnpp.usda.gov National Osteoporosis Foundation: www.nof.org Document Released: 05/19/2003 Document Revised: 06/23/2012 Document Reviewed: 08/18/2008 Exit[employer] Patient Information   2015 [employer]. This information is not intended to replace advice given to you by your health care provider. Make sure you discuss any questions you have with your health care provider.  

## 2014-07-27 LAB — CMP14+EGFR
ALBUMIN: 4.4 g/dL (ref 3.5–4.7)
ALT: 14 IU/L (ref 0–32)
AST: 25 IU/L (ref 0–40)
Albumin/Globulin Ratio: 2.2 (ref 1.1–2.5)
Alkaline Phosphatase: 54 IU/L (ref 39–117)
BUN / CREAT RATIO: 27 — AB (ref 11–26)
BUN: 25 mg/dL (ref 8–27)
Bilirubin Total: 0.7 mg/dL (ref 0.0–1.2)
CALCIUM: 9.4 mg/dL (ref 8.7–10.3)
CO2: 28 mmol/L (ref 18–29)
CREATININE: 0.94 mg/dL (ref 0.57–1.00)
Chloride: 103 mmol/L (ref 97–108)
GFR calc Af Amer: 65 mL/min/{1.73_m2} (ref >59)
GFR calc non Af Amer: 56 mL/min/{1.73_m2} — ABNORMAL LOW (ref >59)
GLOBULIN, TOTAL: 2 g/dL (ref 1.5–4.5)
Glucose: 137 mg/dL — ABNORMAL HIGH (ref 65–99)
Potassium: 4.5 mmol/L (ref 3.5–5.2)
Sodium: 146 mmol/L — ABNORMAL HIGH (ref 134–144)
Total Protein: 6.4 g/dL (ref 6.0–8.5)

## 2014-07-27 LAB — NMR, LIPOPROFILE
CHOLESTEROL: 126 mg/dL (ref 100–199)
HDL Cholesterol by NMR: 56 mg/dL (ref >39)
HDL Particle Number: 43.7 umol/L (ref >=30.5)
LDL PARTICLE NUMBER: 756 nmol/L (ref <1000)
LDL SIZE: 20.6 nm (ref >20.5)
LDL-C: 44 mg/dL (ref 0–99)
LP-IR SCORE: 39 (ref <=45)
Small LDL Particle Number: 427 nmol/L (ref <=527)
Triglycerides by NMR: 130 mg/dL (ref 0–149)

## 2014-10-20 ENCOUNTER — Other Ambulatory Visit: Payer: Self-pay | Admitting: Nurse Practitioner

## 2014-10-20 DIAGNOSIS — Z780 Asymptomatic menopausal state: Secondary | ICD-10-CM

## 2014-11-04 ENCOUNTER — Ambulatory Visit (INDEPENDENT_AMBULATORY_CARE_PROVIDER_SITE_OTHER): Payer: PPO

## 2014-11-04 ENCOUNTER — Encounter: Payer: Self-pay | Admitting: Nurse Practitioner

## 2014-11-04 ENCOUNTER — Ambulatory Visit (INDEPENDENT_AMBULATORY_CARE_PROVIDER_SITE_OTHER): Payer: PPO | Admitting: Nurse Practitioner

## 2014-11-04 VITALS — BP 132/82 | HR 86 | Temp 97.3°F | Ht 61.0 in | Wt 173.0 lb

## 2014-11-04 DIAGNOSIS — M8580 Other specified disorders of bone density and structure, unspecified site: Secondary | ICD-10-CM | POA: Diagnosis not present

## 2014-11-04 DIAGNOSIS — R001 Bradycardia, unspecified: Secondary | ICD-10-CM | POA: Diagnosis not present

## 2014-11-04 DIAGNOSIS — Z780 Asymptomatic menopausal state: Secondary | ICD-10-CM | POA: Diagnosis not present

## 2014-11-04 DIAGNOSIS — K5730 Diverticulosis of large intestine without perforation or abscess without bleeding: Secondary | ICD-10-CM

## 2014-11-04 DIAGNOSIS — E785 Hyperlipidemia, unspecified: Secondary | ICD-10-CM

## 2014-11-04 DIAGNOSIS — M1A09X0 Idiopathic chronic gout, multiple sites, without tophus (tophi): Secondary | ICD-10-CM

## 2014-11-04 DIAGNOSIS — Z6831 Body mass index (BMI) 31.0-31.9, adult: Secondary | ICD-10-CM

## 2014-11-04 DIAGNOSIS — H9191 Unspecified hearing loss, right ear: Secondary | ICD-10-CM | POA: Diagnosis not present

## 2014-11-04 DIAGNOSIS — E1142 Type 2 diabetes mellitus with diabetic polyneuropathy: Secondary | ICD-10-CM | POA: Diagnosis not present

## 2014-11-04 DIAGNOSIS — I10 Essential (primary) hypertension: Secondary | ICD-10-CM | POA: Diagnosis not present

## 2014-11-04 LAB — POCT GLYCOSYLATED HEMOGLOBIN (HGB A1C): Hemoglobin A1C: 6.8

## 2014-11-04 MED ORDER — ALLOPURINOL 100 MG PO TABS: 100.0000 mg | ORAL_TABLET | Freq: Every day | ORAL | Status: DC

## 2014-11-04 MED ORDER — RALOXIFENE HCL 60 MG PO TABS: 60.0000 mg | ORAL_TABLET | Freq: Every day | ORAL | Status: DC

## 2014-11-04 MED ORDER — GABAPENTIN 300 MG PO CAPS: 300.0000 mg | ORAL_CAPSULE | Freq: Every day | ORAL | Status: DC

## 2014-11-04 MED ORDER — FOSINOPRIL SODIUM 40 MG PO TABS: ORAL_TABLET | ORAL | Status: DC

## 2014-11-04 MED ORDER — ROSUVASTATIN CALCIUM 10 MG PO TABS: 10.0000 mg | ORAL_TABLET | Freq: Every day | ORAL | Status: DC

## 2014-11-04 MED ORDER — TRIAMTERENE-HCTZ 37.5-25 MG PO CAPS: ORAL_CAPSULE | ORAL | Status: DC

## 2014-11-04 MED ORDER — FELODIPINE ER 2.5 MG PO TB24: ORAL_TABLET | ORAL | Status: DC

## 2014-11-04 MED ORDER — METFORMIN HCL 1000 MG PO TABS: ORAL_TABLET | ORAL | Status: DC

## 2014-11-04 NOTE — Progress Notes (Addendum)
Subjective:    Patient ID: Sara Holmes, female    DOB: March 21, 1928, 81 y.o.   MRN: [id number]  Pt here for chronic medical follow up. Her only complaint is decrease hearing.  Diabetes She presents for her follow-up diabetic visit. She has type 2 diabetes mellitus. No MedicAlert identification noted. Her disease course has been stable. Pertinent negatives for hypoglycemia include no headaches. Pertinent negatives for diabetes include no chest pain and no fatigue. There are no hypoglycemic complications. Symptoms are worsening. There are no diabetic complications. Risk factors for coronary artery disease include diabetes mellitus, dyslipidemia, hypertension, obesity and post-menopausal. She is compliant with treatment none of the time. Her weight is stable. She is following a diabetic diet. When asked about meal planning, she reported none. She has not had a previous visit with a dietitian. She rarely participates in exercise. Her breakfast blood glucose is taken between 8-9 am. Her breakfast blood glucose range is generally 110-130 mg/dl. Her overall blood glucose range is 110-130 mg/dl. An ACE inhibitor/angiotensin II receptor blocker is being taken. She does not see a podiatrist.Eye exam is current.  Hypertension This is a chronic problem. The current episode started more than 1 year ago. The problem is unchanged. Pertinent negatives include no chest pain, headaches, palpitations or shortness of breath. Risk factors for coronary artery disease include diabetes mellitus, dyslipidemia, post-menopausal state and obesity. Past treatments include ACE inhibitors and diuretics. The current treatment provides moderate improvement. Compliance problems include diet and exercise.   Hyperlipidemia This is a chronic problem. The current episode started more than 1 year ago. The problem is controlled. Exacerbating diseases include diabetes and obesity. She has no history of hypothyroidism. Factors aggravating her  hyperlipidemia include thiazides. Pertinent negatives include no chest pain, myalgias or shortness of breath. Current antihyperlipidemic treatment includes statins. The current treatment provides moderate improvement of lipids. There are no compliance problems.  Risk factors for coronary artery disease include diabetes mellitus, dyslipidemia, hypertension, obesity and post-menopausal.  Diverticulosis Patient just watches her diet- no recent flare ups. glaucoma Goes 1x a year and sometimes more often GOUT Has not been taling allopurinol in awhile- had flare up about 3 months ago- wold like to go back on meds.   Review of Systems  Constitutional: Negative.  Negative for fever, activity change and fatigue.  HENT: Negative for congestion, ear discharge and ear pain.   Eyes: Negative.  Negative for visual disturbance.  Respiratory: Negative.  Negative for shortness of breath.   Cardiovascular: Negative for chest pain and palpitations.  Gastrointestinal: Negative for abdominal pain, constipation and blood in stool.  Genitourinary: Negative.   Musculoskeletal: Negative.  Negative for myalgias and joint swelling.  Neurological: Negative for headaches.  All other systems reviewed and are negative.      Objective:   Physical Exam  Constitutional: She is oriented to person, place, and time. She appears well-developed and well-nourished.  HENT:  Nose: Nose normal.  Mouth/Throat: Oropharynx is clear and moist.  Small amount of cerumen removed from right ear with currette.  Eyes: EOM are normal.  Neck: Trachea normal, normal range of motion and full passive range of motion without pain. Neck supple. No JVD present. Carotid bruit is not present. No thyromegaly present.  Cardiovascular: Normal rate, regular rhythm, normal heart sounds and intact distal pulses.  Exam reveals no gallop and no friction rub.   No murmur heard. Pulmonary/Chest: Effort normal and breath sounds normal.  Abdominal: Soft.  Bowel sounds are normal. She  exhibits no distension and no mass. There is no tenderness.  Musculoskeletal: Normal range of motion.  Lymphadenopathy:    She has no cervical adenopathy.  Neurological: She is alert and oriented to person, place, and time. She has normal reflexes.  Skin: Skin is warm and dry.  Psychiatric: She has a normal mood and affect. Her behavior is normal. Judgment and thought content normal.    BP 154/90 mmHg  Pulse 86  Temp(Src) 97.3 F (36.3 C) (Oral)  Ht '5\' 1"'  (1.549 m)  Wt 173 lb (78.472 kg)  BMI 32.70 kg/m2  Results for orders placed or performed in visit on 11/04/14  POCT glycosylated hemoglobin (Hb A1C)  Result Value Ref Range   Hemoglobin A1C 6.8           Assessment & Plan:  1. Hyperlipidemia Low fat diet - Lipid panel - rosuvastatin (CRESTOR) 10 MG tablet; Take 1 tablet (10 mg total) by mouth daily.  Dispense: 30 tablet; Refill: 5  2. Essential hypertension Do not add salt to diet - CMP14+EGFR - fosinopril (MONOPRIL) 40 MG tablet; TAKE 1 TABLET (40 MG TOTAL) BY MOUTH DAILY.  Dispense: 30 tablet; Refill: 5 - felodipine (PLENDIL) 2.5 MG 24 hr tablet; TAKE 1 TABLET (2.5 MG TOTAL) BY MOUTH DAILY.  Dispense: 30 tablet; Refill: 5 - triamterene-hydrochlorothiazide (DYAZIDE) 37.5-25 MG per capsule; TAKE 1 CAPSULE BY MOUTH EVERY DAY  Dispense: 30 capsule; Refill: 5  3. Diverticulosis of large intestine without hemorrhage  4. Diabetic polyneuropathy associated with type 2 diabetes mellitus Continue to watch carbs in diet - POCT glycosylated hemoglobin (Hb A1C) - gabapentin (NEURONTIN) 300 MG capsule; Take 1 capsule (300 mg total) by mouth daily.  Dispense: 30 capsule; Refill: 5 - metFORMIN (GLUCOPHAGE) 1000 MG tablet; TAKE 1 TABLET BY MOUTH in the am and 0.5 tablet by mouthe every pm  Dispense: 60 tablet; Refill: 5  5. Osteopenia Weight bearing exrcises - raloxifene (EVISTA) 60 MG tablet; Take 1 tablet (60 mg total) by mouth daily.  Dispense: 30  tablet; Refill: 5  6. Bradycardia  7. BMI 31.0-31.9,adult Discussed diet and exercise for person with BMI >25 Will recheck weight in 3-6 months   8. Idiopathic chronic gout of multiple sites without tophus - allopurinol (ZYLOPRIM) 100 MG tablet; Take 1 tablet (100 mg total) by mouth daily.  Dispense: 30 tablet; Refill: 6  9. Decrease hearing - referral to dr. Romeo Rabon  Labs pending Health maintenance reviewed Diet and exercise encouraged Continue all meds Follow up  In 3 month   [place]ynn, FNP

## 2014-11-04 NOTE — Addendum Note (Signed)
Addended by: Chevis Pretty on: 11/04/2014 09:16 AM   Modules accepted: Orders

## 2014-11-04 NOTE — Patient Instructions (Signed)
Bone Health Our bones do many things. They provide structure, protect organs, anchor muscles, and store calcium. Adequate calcium in your diet and weight-bearing physical activity help build strong bones, improve bone amounts, and may reduce the risk of weakening of bones (osteoporosis) later in life. PEAK BONE MASS By age 79, the average woman has acquired most of her skeletal bone mass. A large decline occurs in older adults which increases the risk of osteoporosis. In women this occurs around the time of menopause. It is important for young girls to reach their peak bone mass in order to maintain bone health throughout life. A person with high bone mass as a young adult will be more likely to have a higher bone mass later in life. Not enough calcium consumption and physical activity early on could result in a failure to achieve optimum bone mass in adulthood. OSTEOPOROSIS Osteoporosis is a disease of the bones. It is defined as low bone mass with deterioration of bone structure. Osteoporosis leads to an increase risk of fractures with falls. These fractures commonly happen in the wrist, hip, and spine. While men and women of all ages and background can develop osteoporosis, some of the risk factors for osteoporosis are:  Female.  White.  Postmenopausal.  Older adults.  Small in body size.  Eating a diet low in calcium.  Physically inactive.  Smoking.  Use of some medications.  Family history. CALCIUM Calcium is a mineral needed by the body for healthy bones, teeth, and proper function of the heart, muscles, and nerves. The body cannot produce calcium so it must be absorbed through food. Good sources of calcium include:  Dairy products (low fat or nonfat milk, cheese, and yogurt).  Dark green leafy vegetables (bok choy and broccoli).  Calcium fortified foods (orange juice, cereal, bread, soy beverages, and tofu products).  Nuts (almonds). Recommended amounts of calcium vary  for individuals. RECOMMENDED CALCIUM INTAKES Age and Amount in mg per day  Children 1 to 3 years / 700 mg  Children 4 to 8 years / 1,000 mg  Children 9 to 13 years / 1,300 mg  Teens 14 to 18 years / 1,300 mg  Adults 19 to 50 years / 1,000 mg  Adult women 51 to 70 years / 1,200 mg  Adults 71 years and older / 1,200 mg  Pregnant and breastfeeding teens / 1,300 mg  Pregnant and breastfeeding adults / 1,000 mg Vitamin D also plays an important role in healthy bone development. Vitamin D helps in the absorption of calcium. WEIGHT-BEARING PHYSICAL ACTIVITY Regular physical activity has many positive health benefits. Benefits include strong bones. Weight-bearing physical activity early in life is important in reaching peak bone mass. Weight-bearing physical activities cause muscles and bones to work against gravity. Some examples of weight bearing physical activities include:  Walking, jogging, or running.  Field Hockey.  Jumping rope.  Dancing.  Soccer.  Tennis or Racquetball.  Stair climbing.  Basketball.  Hiking.  Weight lifting.  Aerobic fitness classes. Including weight-bearing physical activity into an exercise plan is a great way to keep bones healthy. Adults: Engage in at least 30 minutes of moderate physical activity on most, preferably all, days of the week. Children: Engage in at least 60 minutes of moderate physical activity on most, preferably all, days of the week. FOR MORE INFORMATION [place] Department of Agriculture, Center for Nutrition Policy and Promotion: www.cnpp.usda.gov National Osteoporosis Foundation: www.nof.org Document Released: 05/19/2003 Document Revised: 06/23/2012 Document Reviewed: 08/18/2008 Exit[employer] Patient Information   2015 [employer]. This information is not intended to replace advice given to you by your health care provider. Make sure you discuss any questions you have with your health care provider.  

## 2014-11-05 ENCOUNTER — Encounter: Payer: Self-pay | Admitting: Nurse Practitioner

## 2014-11-05 DIAGNOSIS — N183 Chronic kidney disease, stage 3 (moderate): Secondary | ICD-10-CM

## 2014-11-05 DIAGNOSIS — I129 Hypertensive chronic kidney disease with stage 1 through stage 4 chronic kidney disease, or unspecified chronic kidney disease: Secondary | ICD-10-CM | POA: Insufficient documentation

## 2014-11-05 DIAGNOSIS — E1122 Type 2 diabetes mellitus with diabetic chronic kidney disease: Secondary | ICD-10-CM | POA: Insufficient documentation

## 2014-11-05 LAB — CMP14+EGFR
ALT: 12 IU/L (ref 0–32)
AST: 16 IU/L (ref 0–40)
Albumin/Globulin Ratio: 2.5 (ref 1.1–2.5)
Albumin: 4.3 g/dL (ref 3.5–4.7)
Alkaline Phosphatase: 57 IU/L (ref 39–117)
BUN/Creatinine Ratio: 27 — ABNORMAL HIGH (ref 11–26)
BUN: 28 mg/dL — ABNORMAL HIGH (ref 8–27)
Bilirubin Total: 0.6 mg/dL (ref 0.0–1.2)
CO2: 26 mmol/L (ref 18–29)
Calcium: 9.2 mg/dL (ref 8.7–10.3)
Chloride: 99 mmol/L (ref 97–108)
Creatinine, Ser: 1.03 mg/dL — ABNORMAL HIGH (ref 0.57–1.00)
GFR calc Af Amer: 58 mL/min/{1.73_m2} — ABNORMAL LOW (ref >59)
GFR calc non Af Amer: 50 mL/min/{1.73_m2} — ABNORMAL LOW (ref >59)
GLOBULIN, TOTAL: 1.7 g/dL (ref 1.5–4.5)
GLUCOSE: 141 mg/dL — AB (ref 65–99)
Potassium: 4 mmol/L (ref 3.5–5.2)
SODIUM: 142 mmol/L (ref 134–144)
Total Protein: 6 g/dL (ref 6.0–8.5)

## 2014-11-05 LAB — LIPID PANEL
CHOLESTEROL TOTAL: 132 mg/dL (ref 100–199)
Chol/HDL Ratio: 2.6 ratio units (ref 0.0–4.4)
HDL: 51 mg/dL (ref >39)
LDL Calculated: 56 mg/dL (ref 0–99)
TRIGLYCERIDES: 126 mg/dL (ref 0–149)
VLDL Cholesterol Cal: 25 mg/dL (ref 5–40)

## 2014-11-19 ENCOUNTER — Other Ambulatory Visit: Payer: Self-pay | Admitting: Nurse Practitioner

## 2014-11-19 NOTE — Telephone Encounter (Signed)
Last seen 11/04/14  MMM Don't see this med on EPIC list

## 2014-11-24 ENCOUNTER — Encounter: Payer: Self-pay | Admitting: Family Medicine

## 2014-11-24 ENCOUNTER — Ambulatory Visit (INDEPENDENT_AMBULATORY_CARE_PROVIDER_SITE_OTHER): Payer: PPO | Admitting: Family Medicine

## 2014-11-24 VITALS — BP 131/80 | HR 76 | Temp 98.4°F | Ht 61.0 in | Wt 168.8 lb

## 2014-11-24 DIAGNOSIS — R42 Dizziness and giddiness: Secondary | ICD-10-CM

## 2014-11-24 DIAGNOSIS — H8110 Benign paroxysmal vertigo, unspecified ear: Secondary | ICD-10-CM

## 2014-11-24 DIAGNOSIS — H9190 Unspecified hearing loss, unspecified ear: Secondary | ICD-10-CM

## 2014-11-24 NOTE — Patient Instructions (Signed)
Great to meet you!  I agree it sounds like you have vertigo, try the epley's maneuvers.  Continue the antivert for a few days.   Benign Positional Vertigo Vertigo means you feel like you or your surroundings are moving when they are not. Benign positional vertigo is the most common form of vertigo. Benign means that the cause of your condition is not serious. Benign positional vertigo is more common in older adults. CAUSES  Benign positional vertigo is the result of an upset in the labyrinth system. This is an area in the middle ear that helps control your balance. This may be caused by a viral infection, head injury, or repetitive motion. However, often no specific cause is found. SYMPTOMS  Symptoms of benign positional vertigo occur when you move your head or eyes in different directions. Some of the symptoms may include:  Loss of balance and falls.  Vomiting.  Blurred vision.  Dizziness.  Nausea.  Involuntary eye movements (nystagmus). DIAGNOSIS  Benign positional vertigo is usually diagnosed by physical exam. If the specific cause of your benign positional vertigo is unknown, your caregiver may perform imaging tests, such as magnetic resonance imaging (MRI) or computed tomography (CT). TREATMENT  Your caregiver may recommend movements or procedures to correct the benign positional vertigo. Medicines such as meclizine, benzodiazepines, and medicines for nausea may be used to treat your symptoms. In rare cases, if your symptoms are caused by certain conditions that affect the inner ear, you may need surgery. HOME CARE INSTRUCTIONS   Follow your caregiver's instructions.  Move slowly. Do not make sudden body or head movements.  Avoid driving.  Avoid operating heavy machinery.  Avoid performing any tasks that would be dangerous to you or others during a vertigo episode.  Drink enough fluids to keep your urine clear or pale yellow. SEEK IMMEDIATE MEDICAL CARE IF:   You  develop problems with walking, weakness, numbness, or using your arms, hands, or legs.  You have difficulty speaking.  You develop severe headaches.  Your nausea or vomiting continues or gets worse.  You develop visual changes.  Your family or friends notice any behavioral changes.  Your condition gets worse.  You have a fever.  You develop a stiff neck or sensitivity to light. MAKE SURE YOU:   Understand these instructions.  Will watch your condition.  Will get help right away if you are not doing well or get worse. Document Released: 12/04/2005 Document Revised: 05/21/2011 Document Reviewed: 11/16/2010 [facility] Patient Information 2015 [place], [place]. This information is not intended to replace advice given to you by your health care provider. Make sure you discuss any questions you have with your health care provider.

## 2014-11-24 NOTE — Progress Notes (Signed)
   HPI  Patient presents today or evaluation of dizziness  Patient states that this is similar to her previous episodes of vertigo but slightly less intense.  She states that the episode started Friday, September 9, after feeling poorly on Thursday. She states that she had ran out of Antivert and got a refill on Saturday. She started taking Antivert now for 2-3 days 3 pills daily. She has improved but not completely resolved.  She states that she has a room spinning sensation with walking but otherwise does not have typical severe room spinning sensation like usual.  She denies any weakness, numbness or tingling, dysarthria, headache, or difficulty walking.  He does not have a history of a stroke.  She also has right eyelid hearing loss. She was previously referred to audiology for this but has not heard anything back in 3 weeks.   PMH: Smoking status noted ROS: Per HPI  Objective: BP 131/80 mmHg  Pulse 76  Temp(Src) 98.4 F (36.9 C) (Oral)  Ht 5\' 1"  (1.549 m)  Wt 168 lb 12.8 oz (76.567 kg)  BMI 31.91 kg/m2 Gen: NAD, alert, cooperative with exam HEENT: NCAT, TMs WNL BL, nares clear, oropharynx clear CV: RRR, good S1/S2, no murmur Resp: CTABL, no wheezes, non-labored Abd: SNTND, BS present, no guarding or organomegaly Ext: No edema, warm Neuro: Alert and oriented, strength 5/5 and sensation intact in all 4 extremities, 2+ patellar tendon reflexes, cranial nerves II through XII intact dix hallpike negative I was concerned for some facial asymmetry as her left side of her mouth appears to be slightly higher than the right side of her mouth, however she looks in the mirror and states that this is normal for her.  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 Continued meclizine, 2-3 more days Dix-Hallpike negative, discussed Epley's maneuvers and given a handout Change referral to ENT, many of  the ENT providers in [place] also do Dix-Hallpike maneuvers and Epley maneuvers as a part of their exams as well. Discussed in detail reasons to return for additional workup, and reviewed signs of stroke which I do not believe that she is having currently. She can voice these easily as her husband previously had a stroke.   Orders Placed This Encounter  Procedures  . Ambulatory referral to ENT    Referral Priority:  Urgent    Referral Type:  Consultation    Referral Reason:  Specialty Services Required    Requested Specialty:  Otolaryngology    Number of Visits Requested:  Hoberg, MD [facility] 11/24/2014, 1:49 PM

## 2014-12-01 ENCOUNTER — Other Ambulatory Visit: Payer: Self-pay | Admitting: Family Medicine

## 2014-12-01 DIAGNOSIS — Z1231 Encounter for screening mammogram for malignant neoplasm of breast: Secondary | ICD-10-CM

## 2014-12-03 ENCOUNTER — Other Ambulatory Visit: Payer: Self-pay | Admitting: Nurse Practitioner

## 2014-12-03 NOTE — Telephone Encounter (Signed)
Pt aware rx has been sent 

## 2014-12-27 ENCOUNTER — Ambulatory Visit (HOSPITAL_COMMUNITY)
Admission: RE | Admit: 2014-12-27 | Discharge: 2014-12-27 | Disposition: A | Discharge status: Home / Self Care | Payer: PPO | Source: Ambulatory Visit | Attending: Family Medicine | Admitting: Family Medicine

## 2014-12-27 DIAGNOSIS — Z1231 Encounter for screening mammogram for malignant neoplasm of breast: Secondary | ICD-10-CM | POA: Diagnosis not present

## 2014-12-31 ENCOUNTER — Ambulatory Visit (INDEPENDENT_AMBULATORY_CARE_PROVIDER_SITE_OTHER): Payer: PPO

## 2014-12-31 DIAGNOSIS — Z23 Encounter for immunization: Secondary | ICD-10-CM

## 2015-01-07 LAB — HM DIABETES EYE EXAM

## 2015-01-18 ENCOUNTER — Ambulatory Visit (INDEPENDENT_AMBULATORY_CARE_PROVIDER_SITE_OTHER): Payer: PPO

## 2015-01-18 ENCOUNTER — Encounter: Payer: Self-pay | Admitting: Family Medicine

## 2015-01-18 ENCOUNTER — Ambulatory Visit (INDEPENDENT_AMBULATORY_CARE_PROVIDER_SITE_OTHER): Payer: PPO | Admitting: Family Medicine

## 2015-01-18 VITALS — BP 136/79 | HR 82 | Temp 97.6°F | Ht 61.0 in | Wt 176.2 lb

## 2015-01-18 DIAGNOSIS — J040 Acute laryngitis: Secondary | ICD-10-CM | POA: Diagnosis not present

## 2015-01-18 DIAGNOSIS — J189 Pneumonia, unspecified organism: Secondary | ICD-10-CM | POA: Diagnosis not present

## 2015-01-18 DIAGNOSIS — R059 Cough, unspecified: Secondary | ICD-10-CM

## 2015-01-18 DIAGNOSIS — R05 Cough: Secondary | ICD-10-CM

## 2015-01-18 MED ORDER — PREDNISONE 20 MG PO TABS: 40.0000 mg | ORAL_TABLET | Freq: Every day | ORAL | Status: DC

## 2015-01-18 MED ORDER — AZITHROMYCIN 250 MG PO TABS: ORAL_TABLET | ORAL | Status: DC

## 2015-01-18 NOTE — Patient Instructions (Signed)
Great to see you!  I am treating you for pneumonia and for laryngitis.   If you have any problems please come back.   Community-Acquired Pneumonia, Adult Pneumonia is an infection of the lungs. There are different types of pneumonia. One type can develop while a person is in a hospital. A different type, called community-acquired pneumonia, develops in people who are not, or have not recently been, in the hospital or other health care facility.  CAUSES Pneumonia may be caused by bacteria, viruses, or funguses. Community-acquired pneumonia is often caused by Streptococcus pneumonia bacteria. These bacteria are often passed from one person to another by breathing in droplets from the cough or sneeze of an infected person. RISK FACTORS The condition is more likely to develop in:  People who havechronic diseases, such as chronic obstructive pulmonary disease (COPD), asthma, congestive heart failure, cystic fibrosis, diabetes, or kidney disease.  People who haveearly-stage or late-stage HIV.  People who havesickle cell disease.  People who havehad their spleen removed (splenectomy).  People who havepoor Human resources officer.  People who havemedical conditions that increase the risk of breathing in (aspirating) secretions their own mouth and nose.   People who havea weakened immune system (immunocompromised).  People who smoke.  People whotravel to areas where pneumonia-causing germs commonly exist.  People whoare around animal habitats or animals that have pneumonia-causing germs, including birds, bats, rabbits, cats, and farm animals. SYMPTOMS Symptoms of this condition include:  Adry cough.  A wet (productive) cough.  Fever.  Sweating.  Chest pain, especially when breathing deeply or coughing.  Rapid breathing or difficulty breathing.  Shortness of breath.  Shaking chills.  Fatigue.  Muscle aches. DIAGNOSIS Your health care provider will take a medical history  and perform a physical exam. You may also have other tests, including:  Imaging studies of your chest, including X-rays.  Tests to check your blood oxygen level and other blood gases.  Other tests on blood, mucus (sputum), fluid around your lungs (pleural fluid), and urine. If your pneumonia is severe, other tests may be done to identify the specific cause of your illness. TREATMENT The type of treatment that you receive depends on many factors, such as the cause of your pneumonia, the medicines you take, and other medical conditions that you have. For most adults, treatment and recovery from pneumonia may occur at home. In some cases, treatment must happen in a hospital. Treatment may include:  Antibiotic medicines, if the pneumonia was caused by bacteria.  Antiviral medicines, if the pneumonia was caused by a virus.  Medicines that are given by mouth or through an IV tube.  Oxygen.  Respiratory therapy. Although rare, treating severe pneumonia may include:  Mechanical ventilation. This is done if you are not breathing well on your own and you cannot maintain a safe blood oxygen level.  Thoracentesis. This procedureremoves fluid around one lung or both lungs to help you breathe better. HOME CARE INSTRUCTIONS  Take over-the-counter and prescription medicines only as told by your health care provider.  Only takecough medicine if you are losing sleep. Understand that cough medicine can prevent your body's natural ability to remove mucus from your lungs.  If you were prescribed an antibiotic medicine, take it as told by your health care provider. Do not stop taking the antibiotic even if you start to feel better.  Sleep in a semi-upright position at night. Try sleeping in a reclining chair, or place a few pillows under your head.  Do not use  tobacco products, including cigarettes, chewing tobacco, and e-cigarettes. If you need help quitting, ask your health care provider.  Drink  enough water to keep your urine clear or pale yellow. This will help to thin out mucus secretions in your lungs. PREVENTION There are ways that you can decrease your risk of developing community-acquired pneumonia. Consider getting a pneumococcal vaccine if:  You are older than 79 years of age.  You are older than 79 years of age and are undergoing cancer treatment, have chronic lung disease, or have other medical conditions that affect your immune system. Ask your health care provider if this applies to you. There are different types and schedules of pneumococcal vaccines. Ask your health care provider which vaccination option is best for you. You may also prevent community-acquired pneumonia if you take these actions:  Get an influenza vaccine every year. Ask your health care provider which type of influenza vaccine is best for you.  Go to the dentist on a regular basis.  Wash your hands often. Use hand sanitizer if soap and water are not available. SEEK MEDICAL CARE IF:  You have a fever.  You are losing sleep because you cannot control your cough with cough medicine. SEEK IMMEDIATE MEDICAL CARE IF:  You have worsening shortness of breath.  You have increased chest pain.  Your sickness becomes worse, especially if you are an older adult or have a weakened immune system.  You cough up blood.   This information is not intended to replace advice given to you by your health care provider. Make sure you discuss any questions you have with your health care provider.   Document Released: 02/26/2005 Document Revised: 11/17/2014 Document Reviewed: 06/23/2014 Elsevier Interactive Patient Education [employer].

## 2015-01-18 NOTE — Progress Notes (Signed)
   HPI  Patient presents today for evaluation of acute illness.  Patient's when she's had 5 days of cough, chest congestion, and hoarseness. She denies dyspnea. She has developed bilateral lower chest pains whenever she coughs. She has no problems with deep inspiration. She has normal oral intake, she is not dyspneic and has no increased work of breathing.  She had an episode of vertigo which I saw her for a couple months ago which has lingered on but has recently resolved. She has seen ENT in the interim for this.  PMH: Smoking status noted ROS: Per HPI  Objective: BP 136/79 mmHg  Pulse 82  Temp(Src) 97.6 F (36.4 C) (Oral)  Ht 5\' 1"  (1.549 m)  Wt 176 lb 3.2 oz (79.924 kg)  BMI 33.31 kg/m2 Gen: NAD, alert, cooperative with exam HEENT: NCAT, TMS WNL BL, oropharynx clear CV: RRR, good S1/S2, no murmur Resp: CTABL, no wheezes, non-labored Ext: No edema, warm Neuro: Alert and oriented, No gross deficits  CXR: obscured R heart birder, no clear infiltrate  Assessment and plan:  # Hoarseness, cough With chest x-ray findings the obscured right heart port will go ahead and treat as needed for pneumonia Azithromycin Prednisone given his well with severe hoarseness Reasons to return reviewed, follow-up as needed otherwise in one month for diabetic check    Laroy Apple, MD [facility] 01/18/2015, 8:27 AM

## 2015-01-19 ENCOUNTER — Telehealth: Payer: Self-pay | Admitting: Family Medicine

## 2015-01-19 NOTE — Telephone Encounter (Signed)
Patient aware and verbalizes understanding. 

## 2015-01-19 NOTE — Telephone Encounter (Signed)
Sara Holmes took prednisone at 10:30 am yesterday and is concerned that it elevated her blood sugar to 202 around 5:30 this morning.  It did come down to 141 around 7:30. Reassured her that this is an unfortunate but expected side effect of prednisone.  She was asymptomatic. Encouraged her to continue taking her blood sugars regularly and to let us know if they remain elevated or if she notices any side effects.  Sara Holmes is a little anxious and wants to know if she should continue taking it.  I will forward this note to Dr Wendi Snipes to review and see if he has any additional recommendations.

## 2015-01-19 NOTE — Telephone Encounter (Signed)
Sara Holmes is exactly right, This is an unfortunate side effect.   She can discontinue, Its benefit is only to speed the healing process for her hoarseness. She will resolve likely within a week without it.   Continue the azithromycin.   Laroy Apple, MD [facility] 01/19/2015, 9:05 AM

## 2015-02-18 ENCOUNTER — Ambulatory Visit: Payer: PPO | Admitting: Nurse Practitioner

## 2015-02-21 ENCOUNTER — Encounter: Payer: Self-pay | Admitting: Family Medicine

## 2015-02-21 ENCOUNTER — Ambulatory Visit (INDEPENDENT_AMBULATORY_CARE_PROVIDER_SITE_OTHER): Payer: PPO | Admitting: Family Medicine

## 2015-02-21 VITALS — BP 136/79 | HR 78 | Temp 97.9°F | Ht 61.0 in | Wt 174.4 lb

## 2015-02-21 DIAGNOSIS — E118 Type 2 diabetes mellitus with unspecified complications: Secondary | ICD-10-CM

## 2015-02-21 DIAGNOSIS — E119 Type 2 diabetes mellitus without complications: Secondary | ICD-10-CM

## 2015-02-21 DIAGNOSIS — E1142 Type 2 diabetes mellitus with diabetic polyneuropathy: Secondary | ICD-10-CM | POA: Diagnosis not present

## 2015-02-21 DIAGNOSIS — M8580 Other specified disorders of bone density and structure, unspecified site: Secondary | ICD-10-CM | POA: Diagnosis not present

## 2015-02-21 DIAGNOSIS — I10 Essential (primary) hypertension: Secondary | ICD-10-CM

## 2015-02-21 LAB — POCT GLYCOSYLATED HEMOGLOBIN (HGB A1C): Hemoglobin A1C: 7.3

## 2015-02-21 MED ORDER — RALOXIFENE HCL 60 MG PO TABS: 60.0000 mg | ORAL_TABLET | Freq: Every day | ORAL | Status: DC

## 2015-02-21 MED ORDER — TRIAMTERENE-HCTZ 37.5-25 MG PO CAPS: ORAL_CAPSULE | ORAL | Status: DC

## 2015-02-21 NOTE — Patient Instructions (Signed)
Great to meet you!  Your A1C is up a little to 7.3 = Average blood sugar of 163   Try to watch your diet a bit more carefully, come back in 3 months  Diet Recommendations for Diabetes   Starchy (carb) foods include: Bread, rice, pasta, potatoes, corn, crackers, bagels, muffins, all baked goods.   Protein foods include: Meat, fish, poultry, eggs, dairy foods, and beans such as pinto and kidney beans (beans also provide carbohydrate).   1. Eat at least 3 meals and 1-2 snacks per day. Never go more than 4-5 hours while awake without eating.  2. Limit starchy foods to TWO per meal and ONE per snack. ONE portion of a starchy  food is equal to the following:   - ONE slice of bread (or its equivalent, such as half of a hamburger bun).   - 1/2 cup of a "scoopable" starchy food such as potatoes or rice.   - 1 OUNCE (28 grams) of starchy snack foods such as crackers or pretzels (look on label).   - 15 grams of carbohydrate as shown on food label.  3. Both lunch and dinner should include a protein food, a carb food, and vegetables.   - Obtain twice as many veg's as protein or carbohydrate foods for both lunch and dinner.   - Try to keep frozen veg's on hand for a quick vegetable serving.     - Fresh or frozen veg's are best.  4. Breakfast should always include protein.

## 2015-02-21 NOTE — Progress Notes (Signed)
   HPI  Patient presents today to discuss diabetes.  Patient explains she has no problem with her medications, good medication compliance Average fasting blood sugar is 1:15 to 1:30. No polyuria, polydipsia She does have a history of burning type pain in the feet that is helped by gabapentin. She is a small sore on her right third toe which she states is a blister from his shoe a few days ago and is healing normally.  She's had dietary indiscretions lately and would like to work on diet before medication increase if it is needed   PMH: Smoking status noted ROS: Per HPI  Objective: BP 136/79 mmHg  Pulse 78  Temp(Src) 97.9 F (36.6 C) (Oral)  Ht $R'5\' 1"'SI$  (1.549 m)  Wt 174 lb 6.4 oz (79.107 kg)  BMI 32.97 kg/m2 Gen: NAD, alert, cooperative with exam HEENT: NCAT CV: RRR, good S1/S2, no murmur Ext: No edema, warm Neuro: Alert and oriented, No gross deficits  Diabetic foot exam 2+ DP pulses, sensation intact to monofilament throughout, right third toe with clean dry intact bandage otherwise no lesions  Assessment and plan:  # Diabetes Control slipping slightly with A1c of 7.3 today Discussed diet and exercise Close renal monitoring with her age and being on metformin Discontinue for GFR less than 45, her last was 50 follow up 3 months  No raloxifene for osteopenia.  Hypertension Well-controlled Continue ACE inhibitor Refill triamterene/HCTZ  Orders Placed This Encounter  Procedures  . Microalbumin / creatinine urine ratio  . BMP8+EGFR  . POCT glycosylated hemoglobin (Hb A1C)    Meds ordered this encounter  Medications  . raloxifene (EVISTA) 60 MG tablet    Sig: Take 1 tablet (60 mg total) by mouth daily.    Dispense:  30 tablet    Refill:  5  . triamterene-hydrochlorothiazide (DYAZIDE) 37.5-25 MG capsule    Sig: TAKE 1 CAPSULE BY MOUTH EVERY DAY    Dispense:  30 capsule    Refill:  Aneth, MD [facility] 02/21/2015, 9:48  AM

## 2015-02-22 LAB — BMP8+EGFR
BUN/Creatinine Ratio: 19 (ref 11–26)
BUN: 19 mg/dL (ref 8–27)
CALCIUM: 9.3 mg/dL (ref 8.7–10.3)
CO2: 27 mmol/L (ref 18–29)
Chloride: 101 mmol/L (ref 96–106)
Creatinine, Ser: 1 mg/dL (ref 0.57–1.00)
GFR, EST AFRICAN AMERICAN: 60 mL/min/{1.73_m2} (ref >59)
GFR, EST NON AFRICAN AMERICAN: 52 mL/min/{1.73_m2} — AB (ref >59)
Glucose: 149 mg/dL — ABNORMAL HIGH (ref 65–99)
Potassium: 4.5 mmol/L (ref 3.5–5.2)
Sodium: 146 mmol/L — ABNORMAL HIGH (ref 134–144)

## 2015-02-22 LAB — MICROALBUMIN / CREATININE URINE RATIO
Creatinine, Urine: 61.1 mg/dL
MICROALB/CREAT RATIO: <4.9 mg/g creat (ref 0.0–30.0)
Microalbumin, Urine: <3 ug/mL

## 2015-02-26 ENCOUNTER — Other Ambulatory Visit: Payer: Self-pay | Admitting: Nurse Practitioner

## 2015-04-12 ENCOUNTER — Other Ambulatory Visit: Payer: Self-pay | Admitting: *Deleted

## 2015-04-12 MED ORDER — GLUCOSE BLOOD VI STRP: ORAL_STRIP | Status: DC

## 2015-04-23 ENCOUNTER — Ambulatory Visit (INDEPENDENT_AMBULATORY_CARE_PROVIDER_SITE_OTHER): Payer: PPO | Admitting: Family

## 2015-04-23 ENCOUNTER — Encounter: Payer: Self-pay | Admitting: Family

## 2015-04-23 VITALS — BP 127/73 | HR 86 | Temp 97.8°F | Ht 61.0 in | Wt 177.0 lb

## 2015-04-23 DIAGNOSIS — J069 Acute upper respiratory infection, unspecified: Secondary | ICD-10-CM

## 2015-04-23 DIAGNOSIS — J309 Allergic rhinitis, unspecified: Secondary | ICD-10-CM | POA: Diagnosis not present

## 2015-04-23 MED ORDER — FLUTICASONE PROPIONATE 50 MCG/ACT NA SUSP: 2.0000 | Freq: Every day | NASAL | Status: DC

## 2015-04-23 NOTE — Patient Instructions (Signed)
Upper Respiratory Infection, Adult Most upper respiratory infections (URIs) are a viral infection of the air passages leading to the lungs. A URI affects the nose, throat, and upper air passages. The most common type of URI is nasopharyngitis and is typically referred to as "the common cold." URIs run their course and usually go away on their own. Most of the time, a URI does not require medical attention, but sometimes a bacterial infection in the upper airways can follow a viral infection. This is called a secondary infection. Sinus and middle ear infections are common types of secondary upper respiratory infections. Bacterial pneumonia can also complicate a URI. A URI can worsen asthma and chronic obstructive pulmonary disease (COPD). Sometimes, these complications can require emergency medical care and may be life threatening.  CAUSES Almost all URIs are caused by viruses. A virus is a type of germ and can spread from one person to another.  RISKS FACTORS You may be at risk for a URI if:   You smoke.   You have chronic heart or lung disease.  You have a weakened defense (immune) system.   You are very young or very old.   You have nasal allergies or asthma.  You work in crowded or poorly ventilated areas.  You work in health care facilities or schools. SIGNS AND SYMPTOMS  Symptoms typically develop 2-3 days after you come in contact with a cold virus. Most viral URIs last 7-10 days. However, viral URIs from the influenza virus (flu virus) can last 14-18 days and are typically more severe. Symptoms may include:   Runny or stuffy (congested) nose.   Sneezing.   Cough.   Sore throat.   Headache.   Fatigue.   Fever.   Loss of appetite.   Pain in your forehead, behind your eyes, and over your cheekbones (sinus pain).  Muscle aches.  DIAGNOSIS  Your health care provider may diagnose a URI by:  Physical exam.  Tests to check that your symptoms are not due to  another condition such as:  Strep throat.  Sinusitis.  Pneumonia.  Asthma. TREATMENT  A URI goes away on its own with time. It cannot be cured with medicines, but medicines may be prescribed or recommended to relieve symptoms. Medicines may help:  Reduce your fever.  Reduce your cough.  Relieve nasal congestion. HOME CARE INSTRUCTIONS   Take medicines only as directed by your health care provider.   Gargle warm saltwater or take cough drops to comfort your throat as directed by your health care provider.  Use a warm mist humidifier or inhale steam from a shower to increase air moisture. This may make it easier to breathe.  Drink enough fluid to keep your urine clear or pale yellow.   Eat soups and other clear broths and maintain good nutrition.   Rest as needed.   Return to work when your temperature has returned to normal or as your health care provider advises. You may need to stay home longer to avoid infecting others. You can also use a face mask and careful hand washing to prevent spread of the virus.  Increase the usage of your inhaler if you have asthma.   Do not use any tobacco products, including cigarettes, chewing tobacco, or electronic cigarettes. If you need help quitting, ask your health care provider. PREVENTION  The best way to protect yourself from getting a cold is to practice good hygiene.   Avoid oral or hand contact with people with cold   symptoms.   Wash your hands often if contact occurs.  There is no clear evidence that vitamin C, vitamin E, echinacea, or exercise reduces the chance of developing a cold. However, it is always recommended to get plenty of rest, exercise, and practice good nutrition.  SEEK MEDICAL CARE IF:   You are getting worse rather than better.   Your symptoms are not controlled by medicine.   You have chills.  You have worsening shortness of breath.  You have brown or red mucus.  You have yellow or brown nasal  discharge.  You have pain in your face, especially when you bend forward.  You have a fever.  You have swollen neck glands.  You have pain while swallowing.  You have white areas in the back of your throat. SEEK IMMEDIATE MEDICAL CARE IF:   You have severe or persistent:  Headache.  Ear pain.  Sinus pain.  Chest pain.  You have chronic lung disease and any of the following:  Wheezing.  Prolonged cough.  Coughing up blood.  A change in your usual mucus.  You have a stiff neck.  You have changes in your:  Vision.  Hearing.  Thinking.  Mood. MAKE SURE YOU:   Understand these instructions.  Will watch your condition.  Will get help right away if you are not doing well or get worse.   This information is not intended to replace advice given to you by your health care provider. Make sure you discuss any questions you have with your health care provider.   Document Released: 08/22/2000 Document Revised: 07/13/2014 Document Reviewed: 06/03/2013 Elsevier Interactive Patient Education 2016 [employer]  - Take meds as prescribed - Use a cool mist humidifier  -Use saline nose sprays frequently -Saline irrigations of the nose can be very helpful if done frequently.  * 4X daily for 1 week*  * Use of a nettie pot can be helpful with this. Follow directions with this* -Force fluids -For any cough or congestion  Use plain Mucinex- regular strength or max strength is fine   * Children- consult with Pharmacist for dosing -For fever or aces or pains- take tylenol or ibuprofen appropriate for age and weight.  * for fevers greater than 101 orally you may alternate ibuprofen and tylenol every  3 hours. -Throat lozenges if help   Lizabeth Fellner, FNP   

## 2015-04-23 NOTE — Progress Notes (Signed)
Subjective:    Patient ID: Sara Holmes, female    DOB: 04-29-1928, 82 y.o.   MRN: [id number]  Cough This is a new problem. The current episode started in the past 7 days. The problem has been unchanged. The problem occurs every few minutes. The cough is non-productive. Associated symptoms include chills, a fever, postnasal drip, rhinorrhea, shortness of breath and wheezing. Pertinent negatives include no ear congestion, ear pain, headaches, myalgias, nasal congestion or sore throat. The symptoms are aggravated by lying down. She has tried rest and OTC cough suppressant for the symptoms. The treatment provided mild relief. There is no history of asthma or COPD.      Review of Systems  Constitutional: Positive for fever and chills.  HENT: Positive for postnasal drip and rhinorrhea. Negative for ear pain and sore throat.   Eyes: Negative.   Respiratory: Positive for cough, shortness of breath and wheezing.   Cardiovascular: Negative.  Negative for palpitations.  Gastrointestinal: Negative.   Endocrine: Negative.   Genitourinary: Negative.   Musculoskeletal: Negative.  Negative for myalgias.  Neurological: Negative.  Negative for headaches.  Hematological: Negative.   Psychiatric/Behavioral: Negative.   All other systems reviewed and are negative.      Objective:   Physical Exam  Constitutional: She is oriented to person, place, and time. She appears well-developed and well-nourished. No distress.  HENT:  Head: Normocephalic and atraumatic.  Right Ear: External ear normal.  Left Ear: External ear normal.  Nasal passage erythemas with mild swelling  Oropharynx erythemas   Eyes: Pupils are equal, round, and reactive to light.  Neck: Normal range of motion. Neck supple. No thyromegaly present.  Cardiovascular: Normal rate, regular rhythm, normal heart sounds and intact distal pulses.   No murmur heard. Pulmonary/Chest: Effort normal and breath sounds normal. No respiratory  distress. She has no wheezes.  Abdominal: Soft. Bowel sounds are normal. She exhibits no distension. There is no tenderness.  Musculoskeletal: Normal range of motion. She exhibits edema (trace amt in BLE). She exhibits no tenderness.  Neurological: She is alert and oriented to person, place, and time. She has normal reflexes. No cranial nerve deficit.  Skin: Skin is warm and dry.  Psychiatric: She has a normal mood and affect. Her behavior is normal. Judgment and thought content normal.  Vitals reviewed.     Blood pressure 127/73, pulse 86, temperature 97.8 F (36.6 C), temperature source Oral, height 5\' 1"  (1.549 m), weight 177 lb (80.287 kg).     Assessment & Plan:  1. Allergic rhinitis, unspecified allergic rhinitis type -Avoid allergens when possible - fluticasone (FLONASE) 50 MCG/ACT nasal spray; Place 2 sprays into both nostrils daily.  Dispense: 16 g; Refill: 6  2. Acute upper respiratory infection -- Take meds as prescribed - Use a cool mist humidifier  -Use saline nose sprays frequently -Saline irrigations of the nose can be very helpful if done frequently.  * 4X daily for 1 week*  * Use of a nettie pot can be helpful with this. Follow directions with this* -Force fluids -For any cough or congestion  Use plain Mucinex- regular strength or max strength is fine   * Children- consult with Pharmacist for dosing -For fever or aces or pains- take tylenol or ibuprofen appropriate for age and weight.  * for fevers greater than 101 orally you may alternate ibuprofen and tylenol every  3 hours. -Throat lozenges if help - fluticasone (FLONASE) 50 MCG/ACT nasal spray; Place 2 sprays into both nostrils  daily.  Dispense: 16 g; Refill: [place], FNP

## 2015-04-26 ENCOUNTER — Ambulatory Visit: Payer: PPO | Admitting: Family Medicine

## 2015-05-23 ENCOUNTER — Ambulatory Visit (INDEPENDENT_AMBULATORY_CARE_PROVIDER_SITE_OTHER): Payer: PPO | Admitting: Family Medicine

## 2015-05-23 ENCOUNTER — Encounter: Payer: Self-pay | Admitting: Family Medicine

## 2015-05-23 VITALS — BP 146/83 | HR 70 | Temp 97.6°F | Ht 61.0 in | Wt 174.8 lb

## 2015-05-23 DIAGNOSIS — N183 Chronic kidney disease, stage 3 unspecified: Secondary | ICD-10-CM

## 2015-05-23 DIAGNOSIS — E1142 Type 2 diabetes mellitus with diabetic polyneuropathy: Secondary | ICD-10-CM

## 2015-05-23 DIAGNOSIS — E1122 Type 2 diabetes mellitus with diabetic chronic kidney disease: Secondary | ICD-10-CM | POA: Insufficient documentation

## 2015-05-23 DIAGNOSIS — E119 Type 2 diabetes mellitus without complications: Secondary | ICD-10-CM | POA: Insufficient documentation

## 2015-05-23 DIAGNOSIS — N1831 Chronic kidney disease, stage 3a: Secondary | ICD-10-CM | POA: Insufficient documentation

## 2015-05-23 LAB — BAYER DCA HB A1C WAIVED: HB A1C (BAYER DCA - WAIVED): 6.9 % (ref <7.0)

## 2015-05-23 NOTE — Patient Instructions (Addendum)
Great to see you!  Your A1C was 6.9, this is perfect!  We will call with your other lab results within 1 week.

## 2015-05-23 NOTE — Progress Notes (Signed)
   HPI  Patient presents today here to f/u for Dm2 and R hip pain R hip pain X 2-3 days, resolved today Described as sharp lateral hip pain worse with walking, not helped much by tylenol Ice helped No injury  crestor now generic, no myalgias but wondering if hip pain was myalgia  DM2 Watching diet, difficulty while sick recently Good metformin compliance Avg CBG F140-150   PMH: Smoking status noted ROS: Per HPI  Objective: BP 146/83 mmHg  Pulse 70  Temp(Src) 97.6 F (36.4 C) (Oral)  Ht '5\' 1"'$  (1.549 m)  Wt 174 lb 12.8 oz (79.289 kg)  BMI 33.05 kg/m2 Gen: NAD, alert, cooperative with exam HEENT: NCAT CV: RRR, good S1/S2, no murmur Resp: CTABL, no wheezes, non-labored Ext: No edema, warm Neuro: Alert and oriented, No gross deficits  Assessment and plan:  # DM2 Well controlled A1C 6.9 Continue metformin Q 3 month BMP, watching renal function  # CKD 3 CMP Monitor on metformin  Avoid NSAIDs  # R hip pain Resolved, likely transient bursitis      Orders Placed This Encounter  Procedures  . Bayer DCA Hb A1c Waived  . CMP14+EGFR  . CBC with Differential     Laroy Apple, MD [facility] 05/23/2015, 10:48 AM

## 2015-05-24 LAB — CMP14+EGFR
A/G RATIO: 2.3 — AB (ref 1.2–2.2)
ALBUMIN: 4.1 g/dL (ref 3.5–4.7)
ALK PHOS: 50 IU/L (ref 39–117)
ALT: 9 IU/L (ref 0–32)
AST: 17 IU/L (ref 0–40)
BILIRUBIN TOTAL: 0.6 mg/dL (ref 0.0–1.2)
BUN / CREAT RATIO: 18 (ref 11–26)
BUN: 17 mg/dL (ref 8–27)
CHLORIDE: 103 mmol/L (ref 96–106)
CO2: 25 mmol/L (ref 18–29)
Calcium: 8.9 mg/dL (ref 8.7–10.3)
Creatinine, Ser: 0.93 mg/dL (ref 0.57–1.00)
GFR calc non Af Amer: 57 mL/min/{1.73_m2} — ABNORMAL LOW (ref >59)
GFR, EST AFRICAN AMERICAN: 65 mL/min/{1.73_m2} (ref >59)
GLUCOSE: 128 mg/dL — AB (ref 65–99)
Globulin, Total: 1.8 g/dL (ref 1.5–4.5)
POTASSIUM: 4.1 mmol/L (ref 3.5–5.2)
Sodium: 145 mmol/L — ABNORMAL HIGH (ref 134–144)
TOTAL PROTEIN: 5.9 g/dL — AB (ref 6.0–8.5)

## 2015-05-24 LAB — CBC WITH DIFFERENTIAL/PLATELET
BASOS: 1 %
Basophils Absolute: 0 10*3/uL (ref 0.0–0.2)
EOS (ABSOLUTE): 0.2 10*3/uL (ref 0.0–0.4)
EOS: 3 %
HEMATOCRIT: 36.4 % (ref 34.0–46.6)
Hemoglobin: 12 g/dL (ref 11.1–15.9)
Immature Grans (Abs): 0 10*3/uL (ref 0.0–0.1)
Immature Granulocytes: 0 %
LYMPHS ABS: 1.8 10*3/uL (ref 0.7–3.1)
Lymphs: 29 %
MCH: 30.9 pg (ref 26.6–33.0)
MCHC: 33 g/dL (ref 31.5–35.7)
MCV: 94 fL (ref 79–97)
MONOS ABS: 0.5 10*3/uL (ref 0.1–0.9)
Monocytes: 8 %
NEUTROS ABS: 3.7 10*3/uL (ref 1.4–7.0)
NEUTROS PCT: 59 %
PLATELETS: 207 10*3/uL (ref 150–379)
RBC: 3.88 x10E6/uL (ref 3.77–5.28)
RDW: 14.6 % (ref 12.3–15.4)
WBC: 6.3 10*3/uL (ref 3.4–10.8)

## 2015-06-27 ENCOUNTER — Encounter (INDEPENDENT_AMBULATORY_CARE_PROVIDER_SITE_OTHER): Payer: Self-pay

## 2015-06-27 ENCOUNTER — Encounter: Payer: Self-pay | Admitting: Family Medicine

## 2015-06-27 ENCOUNTER — Ambulatory Visit (INDEPENDENT_AMBULATORY_CARE_PROVIDER_SITE_OTHER): Payer: PPO | Admitting: Family Medicine

## 2015-06-27 VITALS — BP 148/87 | HR 74 | Temp 97.8°F | Ht 61.0 in | Wt 174.6 lb

## 2015-06-27 DIAGNOSIS — M542 Cervicalgia: Secondary | ICD-10-CM | POA: Diagnosis not present

## 2015-06-27 DIAGNOSIS — M7061 Trochanteric bursitis, right hip: Secondary | ICD-10-CM | POA: Diagnosis not present

## 2015-06-27 NOTE — Patient Instructions (Signed)
Great to see you!  Try ice on your hip for 15 minutes or so 3-4 times a day for 2-3 days.  Keep moving your neck, ice or heat is ok for it.   Tylenol for pain (1-2 pills no more than 3 times a day)  See the handout about hip bursitis I gave you for more info.

## 2015-06-27 NOTE — Progress Notes (Signed)
   HPI  Patient presents today here with neck and hip pain.  Patient explains that she's had about 2 days of right neck pain after eating Easter lunch at a friend's house. She explains that there is a fan on either side of her which sometimes causes neck pain. She has dull achy neck pain when she turns her head to the right, she does not feel ill. She has no arm symptoms.  Right-sided hip pain For 2-3 days, one day ago was severe enough that she had difficulty walking without a cane, however today it has improved. No injuries, falls.    PMH: Smoking status noted ROS: Per HPI  Objective: BP 148/87 mmHg  Pulse 74  Temp(Src) 97.8 F (36.6 C) (Oral)  Ht 5\' 1"  (1.549 m)  Wt 174 lb 9.6 oz (79.198 kg)  BMI 33.01 kg/m2 Gen: NAD, alert, cooperative with exam HEENT: NCAT CV: RRR, good S1/S2, no murmur Resp: CTABL, no wheezes, non-labored Ext: No edema, warm Neuro: Alert and oriented, No gross deficits  MSK:  Mild tenderness to palp of R gretaer troch area Negative faber of R hip  R neck with no tenderness to [palp, slightly limited ROM with looking to the L    Assessment and plan:  # Right-sided greater trochanteric bursitis Improving Discussed ice, Tylenol, supportive care Also given exercises and handout from the sports medicine patient advisor.  # Neck pain Muscle spasm most likely, exam is benign. Improving Supportive care discussed Return to clinic with any worsening or failure to improve as expected.   Laroy Apple, MD [facility] 06/27/2015, 9:23 AM

## 2015-06-28 ENCOUNTER — Ambulatory Visit: Payer: PPO | Admitting: Family Medicine

## 2015-07-15 ENCOUNTER — Telehealth: Payer: Self-pay | Admitting: Family Medicine

## 2015-07-15 NOTE — Telephone Encounter (Signed)
Patient called stating that she is still having hip pain.  Pain has not gotten any better. Made another appt to see Dr. Wendi Snipes 07/18/15 at 12:55

## 2015-07-18 ENCOUNTER — Encounter: Payer: Self-pay | Admitting: Family Medicine

## 2015-07-18 ENCOUNTER — Ambulatory Visit (INDEPENDENT_AMBULATORY_CARE_PROVIDER_SITE_OTHER): Payer: PPO | Admitting: Family Medicine

## 2015-07-18 ENCOUNTER — Telehealth: Payer: Self-pay | Admitting: Family Medicine

## 2015-07-18 VITALS — BP 144/82 | HR 82 | Temp 97.6°F | Ht 61.0 in | Wt 174.0 lb

## 2015-07-18 DIAGNOSIS — M7061 Trochanteric bursitis, right hip: Secondary | ICD-10-CM | POA: Diagnosis not present

## 2015-07-18 MED ORDER — TRAMADOL HCL 50 MG PO TABS: 50.0000 mg | ORAL_TABLET | Freq: Three times a day (TID) | ORAL | Status: DC | PRN

## 2015-07-18 NOTE — Progress Notes (Signed)
   HPI  Patient presents today here with right-sided lateral hip pain.  Patient splinted since our last visit her pain improved quite a bit, however the last 3-4 days it began to hurt again and she is hurting about as bad as last time. She describes right-sided lateral hip pain as dull with sharp stabbing intermediate pains. She also has soreness of her right buttock and right thigh she feels is due to change in her gait.  She denies any low back pain.  She denies any injury. Previous episode was treated conservatively with Tylenol and ice  PMH: Smoking status noted ROS: Per HPI  Objective: BP 144/82 mmHg  Pulse 82  Temp(Src) 97.6 F (36.4 C) (Oral)  Ht 5\' 1"  (1.549 m)  Wt 174 lb (78.926 kg)  BMI 32.89 kg/m2 Gen: NAD, alert, cooperative with exam HEENT: NCAT CV: RRR, good S1/S2, no murmur Resp: CTABL, no wheezes, non-labored Ext: No edema, warm Neuro: Alert and oriented, No gross deficits  MSK: No tenderness to palpation of right-sided low back  Negative Faber test of the right hip Tenderness to palpation over the right greater trochanter  Assessment and plan:  # Right-sided greater trochanteric bursitis Discussed with her that right-sided bursal injection is a good option, however with her diabetes the steroids would have detrimental effect on her glycemic control Would prefer short course of oral steroids instead of an injection which lasts 2-4 weeks depending on the preparation If needed to do oral course then I would recommend starting additional medication during that period time. For now we'll try tramadol for pain relief Ice for calming down the inflammation Return to clinic if not improved within one week     Meds ordered this encounter  Medications  . traMADol (ULTRAM) 50 MG tablet    Sig: Take 1 tablet (50 mg total) by mouth every 8 (eight) hours as needed.    Dispense:  30 tablet    Refill:  0    Laroy Apple, MD [facility] 07/18/2015, 1:25 PM

## 2015-07-18 NOTE — Patient Instructions (Signed)
Great to see you!  Lets try the pain medicine and ice, if you don't get through this episode in a week or less then we can try a course of steroids and add in an additional diabetes medicine.   It looks like you have greater trochanteric bursitis

## 2015-07-26 ENCOUNTER — Ambulatory Visit: Payer: PPO | Admitting: Family Medicine

## 2015-08-01 ENCOUNTER — Other Ambulatory Visit: Payer: Self-pay | Admitting: Nurse Practitioner

## 2015-08-09 ENCOUNTER — Other Ambulatory Visit: Payer: Self-pay | Admitting: Nurse Practitioner

## 2015-08-26 ENCOUNTER — Encounter: Payer: Self-pay | Admitting: Family Medicine

## 2015-08-26 ENCOUNTER — Ambulatory Visit (INDEPENDENT_AMBULATORY_CARE_PROVIDER_SITE_OTHER): Payer: PPO | Admitting: Family Medicine

## 2015-08-26 VITALS — BP 135/84 | HR 76 | Temp 97.0°F | Ht 61.0 in | Wt 170.4 lb

## 2015-08-26 DIAGNOSIS — E1142 Type 2 diabetes mellitus with diabetic polyneuropathy: Secondary | ICD-10-CM

## 2015-08-26 DIAGNOSIS — I10 Essential (primary) hypertension: Secondary | ICD-10-CM

## 2015-08-26 DIAGNOSIS — N183 Chronic kidney disease, stage 3 unspecified: Secondary | ICD-10-CM

## 2015-08-26 LAB — BMP8+EGFR
BUN / CREAT RATIO: 19 (ref 12–28)
BUN: 18 mg/dL (ref 8–27)
CALCIUM: 9.6 mg/dL (ref 8.7–10.3)
CO2: 25 mmol/L (ref 18–29)
CREATININE: 0.94 mg/dL (ref 0.57–1.00)
Chloride: 103 mmol/L (ref 96–106)
GFR calc Af Amer: 64 mL/min/{1.73_m2} (ref >59)
GFR calc non Af Amer: 56 mL/min/{1.73_m2} — ABNORMAL LOW (ref >59)
GLUCOSE: 121 mg/dL — AB (ref 65–99)
Potassium: 4.5 mmol/L (ref 3.5–5.2)
Sodium: 146 mmol/L — ABNORMAL HIGH (ref 134–144)

## 2015-08-26 LAB — BAYER DCA HB A1C WAIVED: HB A1C: 6.8 % (ref <7.0)

## 2015-08-26 NOTE — Progress Notes (Signed)
   HPI  Patient presents today here for follow-up diabetes.  Diabetes Feels that her blood sugars are measuring a little bit higher on her new glucose monitor Fasting is average 1:30 to 150 She's not really watching her diet Good medication compliance.  Hypertension No chest pain, dyspnea, palpitations, leg edema. Good medication compliance.  Hip pain has resolved, patient is active again. She's walking at least 3 times a week.  PMH: Smoking status noted ROS: Per HPI  Objective: BP 135/84 mmHg  Pulse 76  Temp(Src) 97 F (36.1 C) (Oral)  Ht '5\' 1"'$  (1.549 m)  Wt 170 lb 6.4 oz (77.293 kg)  BMI 32.21 kg/m2 Gen: NAD, alert, cooperative with exam HEENT: NCAT CV: RRR, good S1/S2, no murmur Resp: CTABL, no wheezes, non-labored Ext: No edema, warm Neuro: Alert and oriented, No gross deficits  Assessment and plan:  # Type 2 diabetes Controlled, A1c is 6.8 today Continue metformin and checking BMPs every 3 months, she's borderline renal function for metformin Needs ophthalmology  # Hypertension Well controlled Continue fosinopril and Dyazide   # Chronic kidney disease stage III Repeating BMP today Monitoring closely because she is on metformin  # Greater trochanteric bursitis Resolved    Orders Placed This Encounter  Procedures  . Bayer DCA Hb A1c Waived  . BMP8+EGFR    No orders of the defined types were placed in this encounter.    Laroy Apple, MD [facility] 08/26/2015, 9:41 AM

## 2015-10-24 ENCOUNTER — Other Ambulatory Visit: Payer: Self-pay | Admitting: Nurse Practitioner

## 2015-10-24 DIAGNOSIS — E785 Hyperlipidemia, unspecified: Secondary | ICD-10-CM

## 2015-10-29 ENCOUNTER — Other Ambulatory Visit: Payer: Self-pay | Admitting: Family Medicine

## 2015-11-07 ENCOUNTER — Other Ambulatory Visit: Payer: Self-pay | Admitting: Family Medicine

## 2015-11-17 ENCOUNTER — Other Ambulatory Visit: Payer: Self-pay | Admitting: Nurse Practitioner

## 2015-11-17 DIAGNOSIS — E1142 Type 2 diabetes mellitus with diabetic polyneuropathy: Secondary | ICD-10-CM

## 2015-11-17 DIAGNOSIS — M8580 Other specified disorders of bone density and structure, unspecified site: Secondary | ICD-10-CM

## 2015-11-19 ENCOUNTER — Other Ambulatory Visit: Payer: Self-pay | Admitting: Family Medicine

## 2015-11-19 ENCOUNTER — Other Ambulatory Visit: Payer: Self-pay | Admitting: Nurse Practitioner

## 2015-11-19 DIAGNOSIS — I10 Essential (primary) hypertension: Secondary | ICD-10-CM

## 2015-11-28 ENCOUNTER — Ambulatory Visit (INDEPENDENT_AMBULATORY_CARE_PROVIDER_SITE_OTHER): Payer: PPO | Admitting: Family Medicine

## 2015-11-28 ENCOUNTER — Encounter: Payer: Self-pay | Admitting: Family Medicine

## 2015-11-28 ENCOUNTER — Other Ambulatory Visit: Payer: Self-pay | Admitting: Nurse Practitioner

## 2015-11-28 VITALS — BP 153/82 | HR 80 | Temp 98.2°F | Ht 61.0 in | Wt 171.4 lb

## 2015-11-28 DIAGNOSIS — I10 Essential (primary) hypertension: Secondary | ICD-10-CM | POA: Diagnosis not present

## 2015-11-28 DIAGNOSIS — N183 Chronic kidney disease, stage 3 unspecified: Secondary | ICD-10-CM

## 2015-11-28 DIAGNOSIS — E1142 Type 2 diabetes mellitus with diabetic polyneuropathy: Secondary | ICD-10-CM | POA: Diagnosis not present

## 2015-11-28 DIAGNOSIS — Z23 Encounter for immunization: Secondary | ICD-10-CM

## 2015-11-28 LAB — BAYER DCA HB A1C WAIVED: HB A1C (BAYER DCA - WAIVED): 7.2 % — ABNORMAL HIGH (ref <7.0)

## 2015-11-28 NOTE — Patient Instructions (Signed)
Great to see you!  Lets plan to see you again in 3 months

## 2015-11-28 NOTE — Progress Notes (Signed)
   HPI  Patient presents today for follow-up chronic medical conditions.  Type 2 diabetes Average fasting blood sugar is 1:30 to 150 Good medication compliance Wondering as HCTZ is contributing to elevated blood glucose.  Blood pressure at home is 542H to 062 systolic No chest pain, dyspnea, palpitations, leg edema.  She would like an influenza vaccine today.    PMH: Smoking status noted ROS: Per HPI  Objective: BP (!) 153/82 (BP Location: Right Arm)   Pulse 80   Temp 98.2 F (36.8 C) (Oral)   Ht '5\' 1"'$  (1.549 m)   Wt 171 lb 6.4 oz (77.7 kg)   BMI 32.39 kg/m  Gen: NAD, alert, cooperative with exam HEENT: NCAT CV: RRR, good S1/S2, no murmur Resp: CTABL, no wheezes, non-labored Ext: No edema, warm Neuro: Alert and oriented, No gross deficits  Diabetic Foot Exam - Simple   Simple Foot Form Visual Inspection No deformities, no ulcerations, no other skin breakdown bilaterally:  Yes Sensation Testing Intact to touch and monofilament testing bilaterally:  Yes Pulse Check Posterior Tibialis and Dorsalis pulse intact bilaterally:  Yes Comments      Assessment and plan:  # Type 2 diabetes Reasonably well-controlled considering her age, however A1c has slipped from 6.8-7.2 Continue metformin, continue close monitoring and GFR  # Chronic kidney disease stage III Repeating labs, continue to monitor closely with metformin use  # Hypertension Given age, systolic of 376 is not very unreasonable She reports good measurements at home Continue current medications Consider changing from HCTZ to alternative medication, consider amlodipine   Orders Placed This Encounter  Procedures  . Flu Vaccine QUAD 36+ mos IM  . Bayer DCA Hb A1c Waived  . Lipid panel  . CMP14+EGFR  . CBC with Differential     Laroy Apple, MD [facility] 11/28/2015, 10:34 AM

## 2015-11-29 LAB — LIPID PANEL
Chol/HDL Ratio: 2.1 ratio (ref 0.0–4.4)
Cholesterol, Total: 130 mg/dL (ref 100–199)
HDL: 62 mg/dL (ref >39)
LDL Calculated: 47 mg/dL (ref 0–99)
Triglycerides: 107 mg/dL (ref 0–149)
VLDL Cholesterol Cal: 21 mg/dL (ref 5–40)

## 2015-11-29 LAB — CBC WITH DIFFERENTIAL/PLATELET
Basophils Absolute: 0 x10E3/uL (ref 0.0–0.2)
Basos: 1 %
EOS (ABSOLUTE): 0.1 x10E3/uL (ref 0.0–0.4)
Eos: 2 %
Hematocrit: 36.5 % (ref 34.0–46.6)
Hemoglobin: 12 g/dL (ref 11.1–15.9)
Immature Grans (Abs): 0 x10E3/uL (ref 0.0–0.1)
Immature Granulocytes: 0 %
Lymphocytes Absolute: 1.8 x10E3/uL (ref 0.7–3.1)
Lymphs: 28 %
MCH: 30.2 pg (ref 26.6–33.0)
MCHC: 32.9 g/dL (ref 31.5–35.7)
MCV: 92 fL (ref 79–97)
Monocytes Absolute: 0.5 x10E3/uL (ref 0.1–0.9)
Monocytes: 8 %
Neutrophils Absolute: 3.9 x10E3/uL (ref 1.4–7.0)
Neutrophils: 61 %
Platelets: 204 x10E3/uL (ref 150–379)
RBC: 3.97 x10E6/uL (ref 3.77–5.28)
RDW: 14.1 % (ref 12.3–15.4)
WBC: 6.4 x10E3/uL (ref 3.4–10.8)

## 2015-11-29 LAB — CMP14+EGFR
ALT: 10 IU/L (ref 0–32)
AST: 19 IU/L (ref 0–40)
Albumin/Globulin Ratio: 2 (ref 1.2–2.2)
Albumin: 4.1 g/dL (ref 3.5–4.7)
Alkaline Phosphatase: 51 IU/L (ref 39–117)
BUN/Creatinine Ratio: 19 (ref 12–28)
BUN: 17 mg/dL (ref 8–27)
Bilirubin Total: 0.6 mg/dL (ref 0.0–1.2)
CO2: 26 mmol/L (ref 18–29)
Calcium: 9.4 mg/dL (ref 8.7–10.3)
Chloride: 99 mmol/L (ref 96–106)
Creatinine, Ser: 0.91 mg/dL (ref 0.57–1.00)
GFR calc Af Amer: 67 mL/min/1.73 (ref >59)
GFR calc non Af Amer: 58 mL/min/1.73 — ABNORMAL LOW (ref >59)
Globulin, Total: 2.1 g/dL (ref 1.5–4.5)
Glucose: 130 mg/dL — ABNORMAL HIGH (ref 65–99)
Potassium: 3.9 mmol/L (ref 3.5–5.2)
Sodium: 142 mmol/L (ref 134–144)
Total Protein: 6.2 g/dL (ref 6.0–8.5)

## 2015-12-02 ENCOUNTER — Other Ambulatory Visit: Payer: Self-pay | Admitting: Family Medicine

## 2015-12-02 DIAGNOSIS — Z1231 Encounter for screening mammogram for malignant neoplasm of breast: Secondary | ICD-10-CM

## 2015-12-15 ENCOUNTER — Ambulatory Visit (INDEPENDENT_AMBULATORY_CARE_PROVIDER_SITE_OTHER): Payer: PPO | Admitting: Family Medicine

## 2015-12-15 ENCOUNTER — Encounter: Payer: Self-pay | Admitting: Family Medicine

## 2015-12-15 VITALS — BP 134/80 | HR 85 | Temp 98.2°F | Ht 61.0 in | Wt 172.4 lb

## 2015-12-15 DIAGNOSIS — J988 Other specified respiratory disorders: Secondary | ICD-10-CM | POA: Diagnosis not present

## 2015-12-15 DIAGNOSIS — B9789 Other viral agents as the cause of diseases classified elsewhere: Secondary | ICD-10-CM

## 2015-12-15 NOTE — Patient Instructions (Signed)
Great to see you!  For now try Mucinex DM 12 hour tablets twice daily, flonase twice daily for 3 days then once daily, and tylenol as needed  Get plenty of fluids and rest if you can.

## 2015-12-15 NOTE — Progress Notes (Signed)
   HPI  Patient presents today here with cough.  Patient explains she's had nasal congestion, sore throat, chest tightness, and cough for one day. No dyspnea, no fever, no chills or malaise.  She's tolerating foods and fluids normally.  She has not had antibiotics since the spring with her last illness.  She states that she went to the family reunion over the weekend, felt fine the following day, and then started having slow onset symptoms the following day. starting with only feeling unwell about 2 days ago.  PMH: Smoking status noted ROS: Per HPI  Objective: BP 134/80   Pulse 85   Temp 98.2 F (36.8 C) (Oral)   Ht 5\' 1"  (1.549 m)   Wt 172 lb 6.4 oz (78.2 kg)   BMI 32.57 kg/m  Gen: NAD, alert, cooperative with exam HEENT: NCAT, nares with swollen turbinates, oropharynx moist and clear, TMs normal bilaterally CV: RRR, good S1/S2, no murmur Resp: CTABL, no wheezes, non-labored Ext: No edema, warm Neuro: Alert and oriented, No gross deficits  Assessment and plan:  #Viral respiratory illness Reassurance provided, reasons to return, low threshold for return Supportive care discussed including Mucinex DM, Flonase, Tylenol, rest, fluids Return to clinic with any concerns, worsening symptoms, or failure to improve. No signs of discrete bacterial infection at this point  Laroy Apple, MD [facility] 12/15/2015, 9:27 AM

## 2015-12-17 ENCOUNTER — Other Ambulatory Visit: Payer: Self-pay | Admitting: Family Medicine

## 2015-12-17 DIAGNOSIS — E1142 Type 2 diabetes mellitus with diabetic polyneuropathy: Secondary | ICD-10-CM

## 2015-12-24 ENCOUNTER — Ambulatory Visit (INDEPENDENT_AMBULATORY_CARE_PROVIDER_SITE_OTHER): Payer: PPO | Admitting: Family Medicine

## 2015-12-24 ENCOUNTER — Encounter: Payer: Self-pay | Admitting: Family Medicine

## 2015-12-24 VITALS — BP 168/89 | HR 75 | Temp 97.5°F | Ht 61.0 in | Wt 170.0 lb

## 2015-12-24 DIAGNOSIS — R05 Cough: Secondary | ICD-10-CM

## 2015-12-24 DIAGNOSIS — M62838 Other muscle spasm: Secondary | ICD-10-CM | POA: Diagnosis not present

## 2015-12-24 DIAGNOSIS — R059 Cough, unspecified: Secondary | ICD-10-CM

## 2015-12-24 MED ORDER — IBUPROFEN 800 MG PO TABS: 800.0000 mg | ORAL_TABLET | Freq: Three times a day (TID) | ORAL | 0 refills | Status: DC | PRN

## 2015-12-24 MED ORDER — AZITHROMYCIN 250 MG PO TABS: ORAL_TABLET | ORAL | 0 refills | Status: DC

## 2015-12-24 MED ORDER — KETOROLAC TROMETHAMINE 60 MG/2ML IM SOLN
60.0000 mg | Freq: Once | INTRAMUSCULAR | Status: AC
  Administered 2015-12-24: 60 mg via INTRAMUSCULAR

## 2015-12-24 MED ORDER — BACLOFEN 10 MG PO TABS: 10.0000 mg | ORAL_TABLET | Freq: Three times a day (TID) | ORAL | 0 refills | Status: DC

## 2015-12-24 NOTE — Patient Instructions (Signed)
Great to see you!  Take ibuprofen 2-3 times daily for no more than 5 days, take with food  Baclofen is a muscle relaxer that will not cause you to be dizzy.   Please get help if you are getting sick, develop fevers or develop a rash   Acute Bronchitis Bronchitis is when the airways that extend from the windpipe into the lungs get red, puffy, and painful (inflamed). Bronchitis often causes thick spit (mucus) to develop. This leads to a cough. A cough is the most common symptom of bronchitis. In acute bronchitis, the condition usually begins suddenly and goes away over time (usually in 2 weeks). Smoking, allergies, and asthma can make bronchitis worse. Repeated episodes of bronchitis may cause more lung problems. HOME CARE  Rest.  Drink enough fluids to keep your pee (urine) clear or pale yellow (unless you need to limit fluids as told by your doctor).  Only take over-the-counter or prescription medicines as told by your doctor.  Avoid smoking and secondhand smoke. These can make bronchitis worse. If you are a smoker, think about using nicotine gum or skin patches. Quitting smoking will help your lungs heal faster.  Reduce the chance of getting bronchitis again by:  Washing your hands often.  Avoiding people with cold symptoms.  Trying not to touch your hands to your mouth, nose, or eyes.  Follow up with your doctor as told. GET HELP IF: Your symptoms do not improve after 1 week of treatment. Symptoms include:  Cough.  Fever.  Coughing up thick spit.  Body aches.  Chest congestion.  Chills.  Shortness of breath.  Sore throat. GET HELP RIGHT AWAY IF:   You have an increased fever.  You have chills.  You have severe shortness of breath.  You have bloody thick spit (sputum).  You throw up (vomit) often.  You lose too much body fluid (dehydration).  You have a severe headache.  You faint. MAKE SURE YOU:   Understand these instructions.  Will watch your  condition.  Will get help right away if you are not doing well or get worse.   This information is not intended to replace advice given to you by your health care provider. Make sure you discuss any questions you have with your health care provider.   Document Released: 08/15/2007 Document Revised: 10/29/2012 Document Reviewed: 08/19/2012 Elsevier Interactive Patient Education [employer].

## 2015-12-24 NOTE — Progress Notes (Signed)
   HPI  Patient presents today neck pain and stiffness.  Patient reports 2 days symptoms of left-sided neck pain and stiffness. She has difficulty turning her head to the right, to the left, or looking upward. She can look downward slightly better.  She denies any fevers, chills, sweats, or malaise.  She was seen about 2 weeks ago for cough, she's been taking Mucinex since that time. She has persistent cough until now. She denies any dyspnea.  Again no fevers.  Tolerating foods and fluids normally.  Previously had very uncontrolled CBGs with prednisone.   PMH: Smoking status noted ROS: Per HPI  Objective: BP (!) 171/83   Pulse 81   Temp 97.5 F (36.4 C) (Oral)   Ht 5\' 1"  (1.549 m)   Wt 170 lb (77.1 kg)   BMI 32.12 kg/m  Gen: NAD, alert, cooperative with exam HEENT: NCAT CV: RRR, good S1/S2, no murmur Resp: non labored, good air movement, exp rhonchorus sounds in BL Lower lobes Ext: No edema, warm Neuro: Alert and oriented, No gross deficits  MSK:  Tenderness to palpation of paraspinal muscles on the left side of the cervical spine Limited range of motion of the neck, almost full range of motion with touching chin to her chest, however can only look to about 15 to the right, to the left, or upper  Assessment and plan:  # muscle spasm of the neck No signs of meningismus or severe acute illness See below for cough considerations Given her age I have picked baclofen a muscle relaxer, I have recommended a very short course of NSAIDs. Prednisone would be my normal preference in her case, however she has had very uncontrolled CBGs with prednisone in the past. Given IM Toradol in clinic 800 mg of ibuprofen 2-3 times daily times no more than 5 days. Baclofen scheduled 5 days  Her niece is an Therapist, sports in our clinic  Very low threshold for return  # cough Persistent cough, now she has added sounds Cover with azithromycino clear pneumonia, however given persistent cough  and now added sounds I think this is the most prudent course of action     Meds ordered this encounter  Medications  . baclofen (LIORESAL) 10 MG tablet    Sig: Take 1 tablet (10 mg total) by mouth 3 (three) times daily.    Dispense:  30 each    Refill:  0  . ibuprofen (ADVIL,MOTRIN) 800 MG tablet    Sig: Take 1 tablet (800 mg total) by mouth every 8 (eight) hours as needed.    Dispense:  15 tablet    Refill:  0  . azithromycin (ZITHROMAX) 250 MG tablet    Sig: Take 2 tablets on day 1 and 1 tablet daily after that    Dispense:  6 tablet    Refill:  0    Laroy Apple, MD [facility] 12/24/2015, 8:57 AM

## 2015-12-27 ENCOUNTER — Other Ambulatory Visit: Payer: Self-pay | Admitting: Family Medicine

## 2015-12-27 DIAGNOSIS — E1142 Type 2 diabetes mellitus with diabetic polyneuropathy: Secondary | ICD-10-CM

## 2015-12-29 ENCOUNTER — Ambulatory Visit (HOSPITAL_COMMUNITY): Payer: PPO

## 2015-12-31 ENCOUNTER — Other Ambulatory Visit: Payer: Self-pay | Admitting: Family Medicine

## 2016-01-06 ENCOUNTER — Ambulatory Visit (HOSPITAL_COMMUNITY): Payer: PPO

## 2016-01-07 ENCOUNTER — Other Ambulatory Visit: Payer: Self-pay | Admitting: Nurse Practitioner

## 2016-02-01 ENCOUNTER — Ambulatory Visit (HOSPITAL_COMMUNITY)
Admission: RE | Admit: 2016-02-01 | Discharge: 2016-02-01 | Disposition: A | Discharge status: Home / Self Care | Payer: PPO | Source: Ambulatory Visit | Attending: Family Medicine | Admitting: Family Medicine

## 2016-02-01 ENCOUNTER — Other Ambulatory Visit: Payer: Self-pay | Admitting: Urology

## 2016-02-01 DIAGNOSIS — Z1231 Encounter for screening mammogram for malignant neoplasm of breast: Secondary | ICD-10-CM | POA: Diagnosis not present

## 2016-02-18 ENCOUNTER — Other Ambulatory Visit: Payer: Self-pay | Admitting: Family Medicine

## 2016-02-18 DIAGNOSIS — M8580 Other specified disorders of bone density and structure, unspecified site: Secondary | ICD-10-CM

## 2016-02-23 DIAGNOSIS — Z961 Presence of intraocular lens: Secondary | ICD-10-CM | POA: Diagnosis not present

## 2016-02-23 DIAGNOSIS — E119 Type 2 diabetes mellitus without complications: Secondary | ICD-10-CM | POA: Diagnosis not present

## 2016-02-23 DIAGNOSIS — H04123 Dry eye syndrome of bilateral lacrimal glands: Secondary | ICD-10-CM | POA: Diagnosis not present

## 2016-02-23 DIAGNOSIS — H40013 Open angle with borderline findings, low risk, bilateral: Secondary | ICD-10-CM | POA: Diagnosis not present

## 2016-02-23 DIAGNOSIS — H2589 Other age-related cataract: Secondary | ICD-10-CM | POA: Diagnosis not present

## 2016-02-23 LAB — HM DIABETES EYE EXAM

## 2016-02-27 ENCOUNTER — Other Ambulatory Visit: Payer: Self-pay | Admitting: Family Medicine

## 2016-02-27 DIAGNOSIS — I10 Essential (primary) hypertension: Secondary | ICD-10-CM

## 2016-03-02 ENCOUNTER — Ambulatory Visit (INDEPENDENT_AMBULATORY_CARE_PROVIDER_SITE_OTHER): Payer: PPO | Admitting: Family Medicine

## 2016-03-02 ENCOUNTER — Encounter: Payer: Self-pay | Admitting: Family Medicine

## 2016-03-02 ENCOUNTER — Encounter (INDEPENDENT_AMBULATORY_CARE_PROVIDER_SITE_OTHER): Payer: Self-pay

## 2016-03-02 VITALS — BP 130/73 | HR 85 | Temp 97.2°F | Ht 61.0 in | Wt 172.4 lb

## 2016-03-02 DIAGNOSIS — I10 Essential (primary) hypertension: Secondary | ICD-10-CM

## 2016-03-02 DIAGNOSIS — N183 Chronic kidney disease, stage 3 unspecified: Secondary | ICD-10-CM

## 2016-03-02 DIAGNOSIS — J0100 Acute maxillary sinusitis, unspecified: Secondary | ICD-10-CM | POA: Diagnosis not present

## 2016-03-02 DIAGNOSIS — E1142 Type 2 diabetes mellitus with diabetic polyneuropathy: Secondary | ICD-10-CM | POA: Diagnosis not present

## 2016-03-02 LAB — CMP14+EGFR
ALBUMIN: 4.1 g/dL (ref 3.5–4.7)
ALK PHOS: 68 IU/L (ref 39–117)
ALT: 13 IU/L (ref 0–32)
AST: 17 IU/L (ref 0–40)
Albumin/Globulin Ratio: 1.9 (ref 1.2–2.2)
BILIRUBIN TOTAL: 0.7 mg/dL (ref 0.0–1.2)
BUN / CREAT RATIO: 23 (ref 12–28)
BUN: 25 mg/dL (ref 8–27)
CHLORIDE: 101 mmol/L (ref 96–106)
CO2: 24 mmol/L (ref 18–29)
Calcium: 9.4 mg/dL (ref 8.7–10.3)
Creatinine, Ser: 1.08 mg/dL — ABNORMAL HIGH (ref 0.57–1.00)
GFR calc Af Amer: 54 mL/min/{1.73_m2} — ABNORMAL LOW (ref >59)
GFR calc non Af Amer: 47 mL/min/{1.73_m2} — ABNORMAL LOW (ref >59)
GLUCOSE: 146 mg/dL — AB (ref 65–99)
Globulin, Total: 2.2 g/dL (ref 1.5–4.5)
Potassium: 4 mmol/L (ref 3.5–5.2)
SODIUM: 142 mmol/L (ref 134–144)
Total Protein: 6.3 g/dL (ref 6.0–8.5)

## 2016-03-02 LAB — BAYER DCA HB A1C WAIVED: HB A1C (BAYER DCA - WAIVED): 7.3 % — ABNORMAL HIGH (ref <7.0)

## 2016-03-02 MED ORDER — DOXYCYCLINE HYCLATE 100 MG PO TABS: 100.0000 mg | ORAL_TABLET | Freq: Two times a day (BID) | ORAL | 0 refills | Status: DC

## 2016-03-02 NOTE — Progress Notes (Signed)
   HPI  Patient presents today here for follow-up chronic medical conditions as well as ossicle sinus infection Patient reports 3 days of sinus congestion and right-sided facial pain. She denies any fevers, chills, sweats, or difficulty tolerating foods or fluids She also has nagging mild nonproductive cough. She denies ear pain.  Type 2 diabetes Average fasting blood sugar is 120-140 No hypoglycemia Good medication compliance  Hypertension Good medication compliance, no chest pain or headaches.   PMH: Smoking status noted ROS: Per HPI  Objective: BP 130/73   Pulse 85   Temp 97.2 F (36.2 C) (Oral)   Ht 5\' 1"  (1.549 m)   Wt 172 lb 6.4 oz (78.2 kg)   BMI 32.57 kg/m  Gen: NAD, alert, cooperative with exam HEENT: NCAT, right-sided Tenderness to palpation, nares clear, oropharynx moist CV: RRR, good S1/S2, no murmur Resp: CTABL, no wheezes, non-labored Ext: No edema, warm Neuro: Alert and oriented, No gross deficits  Diabetic Foot Exam - Simple   Simple Foot Form Visual Inspection No deformities, no ulcerations, no other skin breakdown bilaterally:  Yes Sensation Testing Intact to touch and monofilament testing bilaterally:  Yes Pulse Check Posterior Tibialis and Dorsalis pulse intact bilaterally:  Yes Comments      Assessment and plan:  # Type 2 diabetes Controlled considering goal of A1c less than 8 at the age of 51. Continue metformin, recheck kidney function with borderline CK D stage III  # Acute maxillary sinusitis Treatment doxycycline, penicillin allergy Recommended Flonase over-the-counter  # Hypertension Well-controlled on Dyazide Continue  # Chronic kidney disease stage III Repeat labs planned every 3-6 months due to mild CK D3 and concomitant metformin use Labs today    Orders Placed This Encounter  Procedures  . Bayer DCA Hb A1c Waived  . Microalbumin / creatinine urine ratio    Meds ordered this encounter  Medications  .  doxycycline (VIBRA-TABS) 100 MG tablet    Sig: Take 1 tablet (100 mg total) by mouth 2 (two) times daily. 1 po bid    Dispense:  20 tablet    Refill:  0    Laroy Apple, MD [facility] 03/02/2016, 9:55 AM

## 2016-03-02 NOTE — Patient Instructions (Signed)
Great to see you!  Take doxycyline with food but not yogurt or milk, consider taking a pro-biotic while you are on the medication.   Come back in 3 months unless you need [place] sooner.

## 2016-03-03 LAB — MICROALBUMIN / CREATININE URINE RATIO
Creatinine, Urine: 130 mg/dL
Microalb/Creat Ratio: 4.3 mg/g creat (ref 0.0–30.0)
Microalbumin, Urine: 5.6 ug/mL

## 2016-03-17 ENCOUNTER — Other Ambulatory Visit: Payer: Self-pay | Admitting: Family Medicine

## 2016-03-17 DIAGNOSIS — I10 Essential (primary) hypertension: Secondary | ICD-10-CM

## 2016-03-26 ENCOUNTER — Other Ambulatory Visit: Payer: Self-pay | Admitting: Family Medicine

## 2016-03-26 DIAGNOSIS — E1142 Type 2 diabetes mellitus with diabetic polyneuropathy: Secondary | ICD-10-CM

## 2016-04-02 ENCOUNTER — Other Ambulatory Visit: Payer: Self-pay | Admitting: Family Medicine

## 2016-04-23 ENCOUNTER — Other Ambulatory Visit: Payer: Self-pay | Admitting: Nurse Practitioner

## 2016-04-23 DIAGNOSIS — E785 Hyperlipidemia, unspecified: Secondary | ICD-10-CM

## 2016-04-27 ENCOUNTER — Other Ambulatory Visit: Payer: Self-pay | Admitting: Family Medicine

## 2016-05-17 ENCOUNTER — Other Ambulatory Visit: Payer: Self-pay | Admitting: Family Medicine

## 2016-05-18 ENCOUNTER — Other Ambulatory Visit: Payer: Self-pay | Admitting: Family Medicine

## 2016-05-18 DIAGNOSIS — M8580 Other specified disorders of bone density and structure, unspecified site: Secondary | ICD-10-CM

## 2016-05-18 DIAGNOSIS — E1142 Type 2 diabetes mellitus with diabetic polyneuropathy: Secondary | ICD-10-CM

## 2016-05-25 ENCOUNTER — Other Ambulatory Visit: Payer: Self-pay | Admitting: Family Medicine

## 2016-05-25 DIAGNOSIS — E1142 Type 2 diabetes mellitus with diabetic polyneuropathy: Secondary | ICD-10-CM

## 2016-06-05 ENCOUNTER — Ambulatory Visit (INDEPENDENT_AMBULATORY_CARE_PROVIDER_SITE_OTHER): Payer: PPO | Admitting: Family Medicine

## 2016-06-05 ENCOUNTER — Ambulatory Visit (INDEPENDENT_AMBULATORY_CARE_PROVIDER_SITE_OTHER): Payer: PPO

## 2016-06-05 ENCOUNTER — Encounter: Payer: Self-pay | Admitting: Family Medicine

## 2016-06-05 ENCOUNTER — Other Ambulatory Visit: Payer: Self-pay | Admitting: Family Medicine

## 2016-06-05 VITALS — BP 137/81 | HR 81 | Temp 97.7°F | Ht 61.0 in | Wt 172.4 lb

## 2016-06-05 DIAGNOSIS — M79644 Pain in right finger(s): Secondary | ICD-10-CM

## 2016-06-05 DIAGNOSIS — E1142 Type 2 diabetes mellitus with diabetic polyneuropathy: Secondary | ICD-10-CM | POA: Diagnosis not present

## 2016-06-05 DIAGNOSIS — I10 Essential (primary) hypertension: Secondary | ICD-10-CM

## 2016-06-05 LAB — BAYER DCA HB A1C WAIVED: HB A1C (BAYER DCA - WAIVED): 7.2 % — ABNORMAL HIGH (ref <7.0)

## 2016-06-05 LAB — CMP14+EGFR
A/G RATIO: 2.3 — AB (ref 1.2–2.2)
ALBUMIN: 4.2 g/dL (ref 3.5–4.7)
ALK PHOS: 56 IU/L (ref 39–117)
ALT: 11 IU/L (ref 0–32)
AST: 18 IU/L (ref 0–40)
BUN/Creatinine Ratio: 22 (ref 12–28)
BUN: 22 mg/dL (ref 8–27)
Bilirubin Total: 0.7 mg/dL (ref 0.0–1.2)
CO2: 26 mmol/L (ref 18–29)
Calcium: 9.3 mg/dL (ref 8.7–10.3)
Chloride: 100 mmol/L (ref 96–106)
Creatinine, Ser: 0.98 mg/dL (ref 0.57–1.00)
GFR calc Af Amer: 61 mL/min/{1.73_m2} (ref >59)
GFR calc non Af Amer: 53 mL/min/{1.73_m2} — ABNORMAL LOW (ref >59)
GLOBULIN, TOTAL: 1.8 g/dL (ref 1.5–4.5)
Glucose: 165 mg/dL — ABNORMAL HIGH (ref 65–99)
Potassium: 4 mmol/L (ref 3.5–5.2)
Sodium: 141 mmol/L (ref 134–144)
Total Protein: 6 g/dL (ref 6.0–8.5)

## 2016-06-05 NOTE — Progress Notes (Signed)
   HPI  Patient presents today for follow-up for chronic medical conditions and finger pain.  Patient states that she "got her finger home" while taking off the package of 2 cream bottles about one month ago. She's had pain at the base of her right middle finger and difficulty with full flexion since that time. She states that the pain is tolerable in several Days she is MAKING IT COMPLETELY CLOSED FIST.  DIABETES  Average fasting is 130-150 Good metformin compliance.  Hypertension Good medication compliance. Stable leg edema, states it's always been that way.  PMH: Smoking status noted ROS: Per HPI  Objective: BP 137/81   Pulse 81   Temp 97.7 F (36.5 C) (Oral)   Ht _0  (1.549 m)   Wt 172 lb 6.4 oz (78.2 kg)   BMI 32.57 kg/m  Gen: NAD, alert, cooperative with exam HEENT: NCAT CV: RRR, good S1/S2, no murmur Resp: CTABL, no wheezes, non-labored Ext: Trace to 1+ pitting edema bilateral lower extremities Neuro: Alert and oriented, No gross deficits  Assessment and plan:  # Type 2 diabetes, neuropathy Controlled given A1c goal of less than 8 with age 83 Continue metformin, repeat labs  # Hypertension Doing well on current medications, no changes  # Finger pain Plain film, likely just a sprain, encouraged her to continue working to make a closed fist.     Orders Placed This Encounter  Procedures  . DG Hand 2 View Right    Standing Status:   Future    Standing Expiration Date:   06/05/2017    Order Specific Question:   Reason for Exam (SYMPTOM  OR DIAGNOSIS REQUIRED)    Answer:   R 3rd finger pain at MCP and PIP    Order Specific Question:   Preferred imaging location?    Answer:   Internal  . Bayer DCA Hb A1c Waived  . [place], MD [facility] 06/05/2016, 9:38 AM

## 2016-06-05 NOTE — Patient Instructions (Signed)
Great to see you!  Your A1C was 7.2, so there are no need for any changes.   Come back in 3 months for follow up diabetes

## 2016-06-20 ENCOUNTER — Other Ambulatory Visit: Payer: Self-pay | Admitting: Nurse Practitioner

## 2016-06-20 DIAGNOSIS — E785 Hyperlipidemia, unspecified: Secondary | ICD-10-CM

## 2016-07-20 ENCOUNTER — Ambulatory Visit (INDEPENDENT_AMBULATORY_CARE_PROVIDER_SITE_OTHER): Payer: PPO | Admitting: Physician Assistant

## 2016-07-20 ENCOUNTER — Encounter: Payer: Self-pay | Admitting: Physician Assistant

## 2016-07-20 VITALS — BP 128/74 | HR 71 | Temp 97.0°F | Ht 61.0 in | Wt 174.0 lb

## 2016-07-20 DIAGNOSIS — M7071 Other bursitis of hip, right hip: Secondary | ICD-10-CM | POA: Diagnosis not present

## 2016-07-20 DIAGNOSIS — N183 Chronic kidney disease, stage 3 unspecified: Secondary | ICD-10-CM

## 2016-07-20 DIAGNOSIS — E1142 Type 2 diabetes mellitus with diabetic polyneuropathy: Secondary | ICD-10-CM | POA: Diagnosis not present

## 2016-07-20 DIAGNOSIS — M7070 Other bursitis of hip, unspecified hip: Secondary | ICD-10-CM | POA: Insufficient documentation

## 2016-07-20 MED ORDER — NAPROXEN 500 MG PO TABS: 500.0000 mg | ORAL_TABLET | Freq: Two times a day (BID) | ORAL | 1 refills | Status: DC

## 2016-07-20 NOTE — Progress Notes (Signed)
BP 128/74   Pulse 71   Temp 97 F (36.1 C) (Oral)   Ht 5\' 1"  (1.549 m)   Wt 174 lb (78.9 kg)   BMI 32.88 kg/m    Subjective:    Patient ID: Sara Holmes, female    DOB: Feb 01, 1929, 83 y.o.   MRN: [id number]  HPI: Sara Holmes is a 83 y.o. female presenting on 07/20/2016 for Hip Pain (pt here today c/o right hip pain) This patient comes in for flareup of right hip bursitis. Proximally one year ago she had a similar episode. She briefly took steroids last year and had increased her sugar. She would rather not take steroids again. She cannot take long-term NSAIDs due to her kidney disease. She has been using over-the-counter arthritis strength Tylenol with fairly good control. She has had increased activity with her church and other activities she is involved with. She has had a lot more standing. She tries to walk every day with her cane to the mailbox. She states that she has no issues at this time with severe imbalance.   All medications are reviewed today. There are no reports of any problems with the medications. All of the medical conditions are reviewed and updated.  Lab work is reviewed and will be ordered as medically necessary. There are other new problems reported with today's visit.    Relevant past medical, surgical, family and social history reviewed and updated as indicated. Allergies and medications reviewed and updated.  Past Medical History:  Diagnosis Date  . Diabetes mellitus without complication ([facility])   . Diverticulosis   . Glaucoma   . Gout   . Hyperlipidemia   . Hypertension   . Osteopenia   . Plantar fasciitis   . Sinus bradycardia     Past Surgical History:  Procedure Laterality Date  . ABDOMINAL HYSTERECTOMY    . CHOLECYSTECTOMY      Review of Systems  Constitutional: Negative.   HENT: Negative.   Eyes: Negative.   Respiratory: Negative.   Gastrointestinal: Negative.   Genitourinary: Negative.   Musculoskeletal: Positive for arthralgias,  gait problem and myalgias.    Allergies as of 07/20/2016      Reactions   Prednisone Other (See Comments)   Elevated blood sugar   Amoxicillin Itching, Rash   Tetanus Toxoids Rash      Medication List       Accurate as of 07/20/16  2:22 PM. Always use your most recent med list.          allopurinol 100 MG tablet Commonly known as:  ZYLOPRIM Take 1 tablet (100 mg total) by mouth daily.   aspirin 81 MG tablet Take 81 mg by mouth daily.   CALTRATE 600+D PO Take 1 tablet by mouth 2 (two) times daily.   felodipine 2.5 MG 24 hr tablet Commonly known as:  PLENDIL TAKE 1 TABLET (2.5 MG TOTAL) BY MOUTH DAILY.   fluticasone 0.05 % cream Commonly known as:  CUTIVATE Apply topically 2 (two) times daily.   fosinopril 40 MG tablet Commonly known as:  MONOPRIL TAKE 1 TABLET (40 MG TOTAL) BY MOUTH DAILY.   gabapentin 300 MG capsule Commonly known as:  NEURONTIN TAKE (1) CAPSULE DAILY   meclizine 25 MG tablet Commonly known as:  ANTIVERT TAKE  (1)  TABLET  THREE TIMES DAILY AS NEEDED.   metFORMIN 1000 MG tablet Commonly known as:  GLUCOPHAGE TAKE 1 TABLET in the am and 1/2 tablet by mouth every pm  naproxen 500 MG tablet Commonly known as:  NAPROSYN Take 1 tablet (500 mg total) by mouth 2 (two) times daily with a meal. Take for 1 week , may repeat in future   ONE TOUCH ULTRA TEST test strip Generic drug:  glucose blood CHECK BLOOD SUGAR ONCE A DAY   ONETOUCH DELICA LANCETS 30S Misc CHECK BLOOD SUGAR ONCE DAILY OR AS DIRECTED   raloxifene 60 MG tablet Commonly known as:  EVISTA Take 1 tablet (60 mg total) by mouth daily.   rosuvastatin 10 MG tablet Commonly known as:  CRESTOR TAKE 1 TABLET DAILY   triamterene-hydrochlorothiazide 37.5-25 MG capsule Commonly known as:  DYAZIDE TAKE 1 CAPSULE BY MOUTH EVERY DAY   Vitamin D3 1000 units Caps Take 1 tablet by mouth daily.          Objective:    BP 128/74   Pulse 71   Temp 97 F (36.1 C) (Oral)   Ht 5'  1" (1.549 m)   Wt 174 lb (78.9 kg)   BMI 32.88 kg/m   Allergies  Allergen Reactions  . Prednisone Other (See Comments)    Elevated blood sugar  . Amoxicillin Itching and Rash  . Tetanus Toxoids Rash    Physical Exam  Constitutional: She is oriented to person, place, and time. She appears well-developed and well-nourished.  HENT:  Head: Normocephalic and atraumatic.  Eyes: Conjunctivae and EOM are normal. Pupils are equal, round, and reactive to light.  Cardiovascular: Normal rate, regular rhythm, normal heart sounds and intact distal pulses.   Pulmonary/Chest: Effort normal and breath sounds normal.  Abdominal: Soft. Bowel sounds are normal.  Musculoskeletal:       Right hip: She exhibits decreased range of motion and decreased strength. She exhibits no deformity.  Neurological: She is alert and oriented to person, place, and time. She has normal reflexes.  Skin: Skin is warm and dry. No rash noted.  Psychiatric: She has a normal mood and affect. Her behavior is normal. Judgment and thought content normal.  Nursing note and vitals reviewed.       Assessment & Plan:   1. Bursitis of other bursa of right hip - naproxen (NAPROSYN) 500 MG tablet; Take 1 tablet (500 mg total) by mouth 2 (two) times daily with a meal. Take for 1 week , may repeat in future  Dispense: 30 tablet; Refill: 1 Patient instructed to use naproxen for 1 week, and hold the medication to see if it will calm down the current bout of inflammation. She can use this again at a future time if she flares up again. I've instructed her to use this rather than steroids which will run up her sugar and she is experienced in the past and she does not need to take chronic NSAIDs due to her kidney disease. Consider joint injection if no improvement.  2. CKD (chronic kidney disease), stage III  3. Type 2 diabetes mellitus with diabetic polyneuropathy, without long-term current use of insulin ([facility])   Current Outpatient  Prescriptions:  .  allopurinol (ZYLOPRIM) 100 MG tablet, Take 1 tablet (100 mg total) by mouth daily., Disp: 30 tablet, Rfl: 4 .  aspirin 81 MG tablet, Take 81 mg by mouth daily., Disp: , Rfl:  .  Calcium Carbonate-Vitamin D (CALTRATE 600+D PO), Take 1 tablet by mouth 2 (two) times daily. , Disp: , Rfl:  .  Cholecalciferol (VITAMIN D3) 1000 UNITS CAPS, Take 1 tablet by mouth daily.  , Disp: , Rfl:  .  felodipine (PLENDIL) 2.5 MG 24 hr tablet, TAKE 1 TABLET (2.5 MG TOTAL) BY MOUTH DAILY., Disp: 30 tablet, Rfl: 2 .  fluticasone (CUTIVATE) 0.05 % cream, Apply topically 2 (two) times daily., Disp: 30 g, Rfl: 2 .  fosinopril (MONOPRIL) 40 MG tablet, TAKE 1 TABLET (40 MG TOTAL) BY MOUTH DAILY., Disp: 30 tablet, Rfl: 4 .  gabapentin (NEURONTIN) 300 MG capsule, TAKE (1) CAPSULE DAILY, Disp: 30 capsule, Rfl: 2 .  meclizine (ANTIVERT) 25 MG tablet, TAKE  (1)  TABLET  THREE TIMES DAILY AS NEEDED., Disp: 30 tablet, Rfl: 0 .  metFORMIN (GLUCOPHAGE) 1000 MG tablet, TAKE 1 TABLET in the am and 1/2 tablet by mouth every pm, Disp: 45 tablet, Rfl: 3 .  ONE TOUCH ULTRA TEST test strip, CHECK BLOOD SUGAR ONCE A DAY, Disp: 100 each, Rfl: 1 .  ONETOUCH DELICA LANCETS 56E MISC, CHECK BLOOD SUGAR ONCE DAILY OR AS DIRECTED, Disp: 100 each, Rfl: 1 .  raloxifene (EVISTA) 60 MG tablet, Take 1 tablet (60 mg total) by mouth daily., Disp: 30 tablet, Rfl: 3 .  rosuvastatin (CRESTOR) 10 MG tablet, TAKE 1 TABLET DAILY, Disp: 30 tablet, Rfl: 0 .  triamterene-hydrochlorothiazide (DYAZIDE) 37.5-25 MG capsule, TAKE 1 CAPSULE BY MOUTH EVERY DAY, Disp: 30 capsule, Rfl: 5 .  naproxen (NAPROSYN) 500 MG tablet, Take 1 tablet (500 mg total) by mouth 2 (two) times daily with a meal. Take for 1 week , may repeat in future, Disp: 30 tablet, Rfl: 1  Continue all other maintenance medications as listed above.  Follow up plan: Return if symptoms worsen or fail to improve, for DR Wendi Snipes if worsens.  Educational handout given for hip  exercises  Terald Sleeper PA-C [facility] [address]  [place], [place] [address] [phone]   07/20/2016, 2:22 PM

## 2016-07-20 NOTE — Patient Instructions (Signed)
Hip Exercises Ask your health care provider which exercises are safe for you. Do exercises exactly as told by your health care provider and adjust them as directed. It is normal to feel mild stretching, pulling, tightness, or discomfort as you do these exercises, but you should stop right away if you feel sudden pain or your pain gets worse.Do not begin these exercises until told by your health care provider. STRETCHING AND RANGE OF MOTION EXERCISES  These exercises warm up your muscles and joints and improve the movement and flexibility of your hip. These exercises also help to relieve pain, numbness, and tingling. Exercise A: Hamstrings, Supine   1. Lie on your back. 2. Loop a belt or towel over the ball of your left / rightfoot. The ball of your foot is on the walking surface, right under your toes. 3. Straighten your left / rightknee and slowly pull on the belt to raise your leg.  Do not let your left / right knee bend while you do this.  Keep your other leg flat on the floor.  Raise the left / right leg until you feel a gentle stretch behind your left / right knee or thigh. 4. Hold this position for __________ seconds. 5. Slowly return your leg to the starting position. Repeat __________ times. Complete this stretch __________ times a day. Exercise B: Hip Rotators   1. Lie on your back on a firm surface. 2. Hold your left / right knee with your left / right hand. Hold your ankle with your other hand. 3. Gently pull your left / right knee and rotate your lower leg toward your other shoulder.  Pull until you feel a stretch in your buttocks.  Keep your hips and shoulders firmly planted while you do this stretch. 4. Hold this position for __________ seconds. Repeat __________ times. Complete this stretch __________ times a day. Exercise C: V-Sit (Hamstrings and Adductors)   1. Sit on the floor with your legs extended in a large "V" shape. Keep your knees straight during this  exercise. 2. Start with your head and chest upright, then bend at your waist to reach for your left foot (position A). You should feel a stretch in your right inner thigh. 3. Hold this position for __________ seconds. Then slowly return to the upright position. 4. Bend at your waist to reach forward (position B). You should feel a stretch behind both of your thighs and knees. 5. Hold this position for __________ seconds. Then slowly return to the upright position. 6. Bend at your waist to reach for your right foot (position C). You should feel a stretch in your left inner thigh. 7. Hold this position for __________ seconds. Then slowly return to the upright position. Repeat __________ times. Complete this stretch __________ times a day. Exercise D: Lunge (Hip Flexors)   1. Place your left / right knee on the floor and bend your other knee so that is directly over your ankle. You should be half-kneeling. 2. Keep good posture with your head over your shoulders. 3. Tighten your buttocks to point your tailbone downward. This helps your back to keep from arching too much. 4. You should feel a gentle stretch in the front of your left / right thigh and hip. If you do not feel any resistance, slightly slide your other foot forward and then slowly lunge forward so your knee once again lines up over your ankle. 5. Make sure your tailbone continues to point downward. 6. Hold this   position for __________ seconds. Repeat __________ times. Complete this stretch __________ times a day. STRENGTHENING EXERCISES  These exercises build strength and endurance in your hip. Endurance is the ability to use your muscles for a long time, even after they get tired. Exercise E: Bridge (Hip Extensors)   1. Lie on your back on a firm surface with your knees bent and your feet flat on the floor. 2. Tighten your buttocks muscles and lift your bottom off the floor until the trunk of your body is level with your thighs.  Do  not arch your back.  You should feel the muscles working in your buttocks and the back of your thighs. If you do not feel these muscles, slide your feet 1-2 inches (2.5-5 cm) farther away from your buttocks. 3. Hold this position for __________ seconds. 4. Slowly lower your hips to the starting position. 5. Let your muscles relax completely between repetitions. 6. If this exercise is too easy, try doing it with your arms crossed over your chest. Repeat __________ times. Complete this exercise __________ times a day. Exercise F: Straight Leg Raises - Hip Abductors   1. Lie on your side with your left / right leg in the top position. Lie so your head, shoulder, knee, and hip line up with each other. You may bend your bottom knee to help you balance. 2. Roll your hips slightly forward, so your hips are stacked directly over each other and your left / right knee is facing forward. 3. Leading with your heel, lift your top leg 4-6 inches (10-15 cm). You should feel the muscles in your outer hip lifting.  Do not let your foot drift forward.  Do not let your knee roll toward the ceiling. 4. Hold this position for __________ seconds. 5. Slowly return to the starting position. 6. Let your muscles relax completely between repetitions. Repeat __________ times. Complete this exercise __________ times a day. Exercise G: Straight Leg Raises - Hip Adductors   1. Lie on your side with your left / right leg in the bottom position. Lie so your head, shoulder, knee, and hip line up. You may place your upper foot in front to help you balance. 2. Roll your hips slightly forward, so your hips are stacked directly over each other and your left / right knee is facing forward. 3. Tense the muscles in your inner thigh and lift your bottom leg 4-6 inches (10-15 cm). 4. Hold this position for __________ seconds. 5. Slowly return to the starting position. 6. Let your muscles relax completely between  repetitions. Repeat __________ times. Complete this exercise __________ times a day. Exercise H: Straight Leg Raises - Quadriceps   1. Lie on your back with your left / right leg extended and your other knee bent. 2. Tense the muscles in the front of your left / right thigh. When you do this, you should see your kneecap slide up or see increased dimpling just above your knee. 3. Tighten these muscles even more and raise your leg 4-6 inches (10-15 cm) off the floor. 4. Hold this position for __________ seconds. 5. Keep these muscles tense as you lower your leg. 6. Relax the muscles slowly and completely between repetitions. Repeat __________ times. Complete this exercise __________ times a day. Exercise I: Hip Abductors, Standing  1. Tie one end of a rubber exercise band or tubing to a secure surface, such as a table or pole. 2. Loop the other end of the band or tubing   around your left / right ankle. 3. Keeping your ankle with the band or tubing directly opposite of the secured end, step away until there is tension in the tubing or band. Hold onto a chair as needed for balance. 4. Lift your left / right leg out to your side. While you do this:  Keep your back upright.  Keep your shoulders over your hips.  Keep your toes pointing forward.  Make sure to use your hip muscles to lift your leg. Do not "throw" your leg or tip your body to lift your leg. 5. Hold this position for __________ seconds. 6. Slowly return to the starting position. Repeat __________ times. Complete this exercise __________ times a day. Exercise J: Squats (Quadriceps)  1. Stand in a door frame so your feet and knees are in line with the frame. You may place your hands on the frame for balance. 2. Slowly bend your knees and lower your hips like you are going to sit in a chair.  Keep your lower legs in a straight-up-and-down position.  Do not let your hips go lower than your knees.  Do not bend your knees lower than  told by your health care provider.  If your hip pain increases, do not bend as low. 3. Hold this position for ___________ seconds. 4. Slowly push with your legs to return to standing. Do not use your hands to pull yourself to standing. Repeat __________ times. Complete this exercise __________ times a day. This information is not intended to replace advice given to you by your health care provider. Make sure you discuss any questions you have with your health care provider. Document Released: 03/16/2005 Document Revised: 11/21/2015 Document Reviewed: 02/21/2015 Elsevier Interactive Patient Education  2017 [employer]  

## 2016-08-18 ENCOUNTER — Other Ambulatory Visit: Payer: Self-pay | Admitting: Family Medicine

## 2016-08-18 DIAGNOSIS — I10 Essential (primary) hypertension: Secondary | ICD-10-CM

## 2016-08-18 DIAGNOSIS — E785 Hyperlipidemia, unspecified: Secondary | ICD-10-CM

## 2016-08-23 ENCOUNTER — Other Ambulatory Visit: Payer: Self-pay | Admitting: Family Medicine

## 2016-08-23 DIAGNOSIS — E1142 Type 2 diabetes mellitus with diabetic polyneuropathy: Secondary | ICD-10-CM

## 2016-08-25 ENCOUNTER — Other Ambulatory Visit: Payer: Self-pay | Admitting: Family Medicine

## 2016-09-04 ENCOUNTER — Other Ambulatory Visit: Payer: Self-pay | Admitting: Family Medicine

## 2016-09-06 ENCOUNTER — Ambulatory Visit (INDEPENDENT_AMBULATORY_CARE_PROVIDER_SITE_OTHER): Payer: PPO | Admitting: Family Medicine

## 2016-09-06 ENCOUNTER — Encounter: Payer: Self-pay | Admitting: Family Medicine

## 2016-09-06 VITALS — BP 165/88 | HR 71 | Temp 97.8°F | Ht 61.0 in | Wt 172.4 lb

## 2016-09-06 DIAGNOSIS — E1142 Type 2 diabetes mellitus with diabetic polyneuropathy: Secondary | ICD-10-CM

## 2016-09-06 DIAGNOSIS — M25551 Pain in right hip: Secondary | ICD-10-CM

## 2016-09-06 DIAGNOSIS — I10 Essential (primary) hypertension: Secondary | ICD-10-CM

## 2016-09-06 LAB — BAYER DCA HB A1C WAIVED: HB A1C (BAYER DCA - WAIVED): 6.7 % (ref <7.0)

## 2016-09-06 MED ORDER — METHYLPREDNISOLONE ACETATE 80 MG/ML IJ SUSP
80.0000 mg | Freq: Once | INTRAMUSCULAR | Status: AC
  Administered 2016-09-06: 80 mg via INTRAMUSCULAR

## 2016-09-06 MED ORDER — DICLOFENAC SODIUM 1 % TD GEL: 4.0000 g | Freq: Four times a day (QID) | TRANSDERMAL | 3 refills | Status: DC

## 2016-09-06 NOTE — Addendum Note (Signed)
Addended by: Karle Plumber on: 09/06/2016 09:57 AM   Modules accepted: Orders

## 2016-09-06 NOTE — Progress Notes (Signed)
   HPI  Patient presents today here for follow-up chronic medical conditions.  Diabetes Good medication compliance and metformin Watching her diet closely Average fasting blood sugars 130s and 140s  Hypertension Average systolic at home is 957M and 30s. Good medication compliance No chest pain or dyspnea.  Right hip pain Improved with naproxen on previous visit, however has re-flared up, and now is not responding naproxen well. Described as right hip pain as well as right buttock pain.  PMH: Smoking status noted ROS: Per HPI  Objective: BP (!) 165/88   Pulse 71   Temp 97.8 F (36.6 C) (Oral)   Ht 5\' 1"  (1.549 m)   Wt 172 lb 6.4 oz (78.2 kg)   BMI 32.57 kg/m  Gen: NAD, alert, cooperative with exam HEENT: NCAT CV: RRR, good S1/S2, no murmur Resp: CTABL, no wheezes, non-labored Ext: No edema, warm Neuro: Alert and oriented, No gross deficits Msk:  Interested palpation of her right greater trochanter, however patient also has tenderness over the right piriformis area and describes right buttock pain as well as right hip pain.  Assessment and plan:  # Type 2 diabetes, diabetic polyneuropathy Stable A1c well controlled No Changes metformin  # Hypertension Well-controlled in general at home, also well-controlled on previous visit Elevated today No changes  # Right hip pain Patient with right greater trochanter bursitis plus pain for the right buttock, possibly piriformis syndrome Patient has used short course NSAIDs well, ice, Biofreeze .improvement. IM  Depo-Medrol given, no bursal injection as this is not the only pain she has voltaren gel    Orders Placed This Encounter  Procedures  . Bayer DCA Hb A1c Waived    No orders of the defined types were placed in this encounter.   Laroy Apple, MD [facility] 09/06/2016, 9:45 AM

## 2016-09-06 NOTE — Addendum Note (Signed)
Addended by: Karle Plumber on: 09/06/2016 09:59 AM   Modules accepted: Orders

## 2016-09-06 NOTE — Patient Instructions (Addendum)
Great to see you!  Come back in 3 months unless you need us sooner.    

## 2016-09-07 LAB — BMP8+EGFR
BUN / CREAT RATIO: 19 (ref 12–28)
BUN: 19 mg/dL (ref 8–27)
CHLORIDE: 102 mmol/L (ref 96–106)
CO2: 26 mmol/L (ref 20–29)
Calcium: 9.5 mg/dL (ref 8.7–10.3)
Creatinine, Ser: 1.02 mg/dL — ABNORMAL HIGH (ref 0.57–1.00)
GFR calc non Af Amer: 50 mL/min/{1.73_m2} — ABNORMAL LOW (ref >59)
GFR, EST AFRICAN AMERICAN: 58 mL/min/{1.73_m2} — AB (ref >59)
GLUCOSE: 135 mg/dL — AB (ref 65–99)
POTASSIUM: 4.3 mmol/L (ref 3.5–5.2)
Sodium: 143 mmol/L (ref 134–144)

## 2016-09-10 ENCOUNTER — Other Ambulatory Visit: Payer: Self-pay | Admitting: Family Medicine

## 2016-09-17 ENCOUNTER — Other Ambulatory Visit: Payer: Self-pay | Admitting: Family Medicine

## 2016-09-17 ENCOUNTER — Ambulatory Visit (INDEPENDENT_AMBULATORY_CARE_PROVIDER_SITE_OTHER): Payer: PPO | Admitting: *Deleted

## 2016-09-17 VITALS — BP 139/80 | HR 69 | Ht 60.5 in | Wt 170.4 lb

## 2016-09-17 DIAGNOSIS — M8580 Other specified disorders of bone density and structure, unspecified site: Secondary | ICD-10-CM

## 2016-09-17 DIAGNOSIS — Z0000 Encounter for general adult medical examination without abnormal findings: Secondary | ICD-10-CM | POA: Diagnosis not present

## 2016-09-17 DIAGNOSIS — E1142 Type 2 diabetes mellitus with diabetic polyneuropathy: Secondary | ICD-10-CM

## 2016-09-17 NOTE — Progress Notes (Signed)
Subjective:   Sara Holmes is a 83 y.o. female who presents for an Initial Medicare Annual Wellness Visit.  Review of Systems    Sara Holmes is a 83 year old female here today for her initial Medicare Annual Wellness visit. 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 She also worked for [employer] for 15 years prior to the bank. She enjoys crocheting, flowering, reading, word search puzzles and she used to love to line dance. She states that she does not exercise right now due to her back and him pain. She states her diet is semi-healthy. She is involved in her church [employer]. 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 She is widowed and does not have any children. Fall hazards were discussed with patient as she has 3 small steps going into the home. Patient does not have any rugs or pets. Patient states that she feels her health is better than it was this time last year. She states that her back and hip do not bother her as bad as they did last year.  Patient has not been hospitalized or had any surgeries in the last year.       Objective:    Today's Vitals   09/17/16 1123  BP: 139/80  Pulse: 69  Weight: 170 lb 6.4 oz (77.3 kg)  Height: 5' 0.5" (1.537 m)   Body mass index is 32.73 kg/m.   Current Medications (verified) Outpatient Encounter Prescriptions as of 09/17/2016  Medication Sig  . allopurinol (ZYLOPRIM) 100 MG tablet Take 1 tablet (100 mg total) by mouth daily.  Marland Kitchen aspirin 81 MG tablet Take 81 mg by mouth daily.  . Calcium Carbonate-Vitamin D (CALTRATE 600+D PO) Take 1 tablet by mouth 2 (two) times daily.   . Cholecalciferol (VITAMIN D3) 1000 UNITS CAPS Take 1 tablet by mouth daily.    . diclofenac sodium (VOLTAREN) 1 % GEL Apply 4 g topically 4 (four) times daily.  . felodipine (PLENDIL) 2.5 MG 24 hr tablet TAKE 1 TABLET (2.5 MG TOTAL) BY MOUTH DAILY.  . fluticasone (CUTIVATE) 0.05 % cream Apply topically 2 (two)  times daily. (Patient taking differently: Apply topically 2 (two) times daily. As needed)  . fosinopril (MONOPRIL) 40 MG tablet TAKE 1 TABLET (40 MG TOTAL) BY MOUTH DAILY.  Marland Kitchen gabapentin (NEURONTIN) 300 MG capsule TAKE (1) CAPSULE DAILY  . meclizine (ANTIVERT) 25 MG tablet TAKE  (1)  TABLET  THREE TIMES DAILY AS NEEDED.  . metFORMIN (GLUCOPHAGE) 1000 MG tablet TAKE 1 TABLET in the am and 1/2 tablet by mouth every pm  . naproxen (NAPROSYN) 500 MG tablet Take 1 tablet (500 mg total) by mouth 2 (two) times daily with a meal. Take for 1 week , may repeat in future (Patient taking differently: Take 500 mg by mouth 2 (two) times daily with a meal. Take for 1 week , may repeat in future as needed)  . ONE TOUCH ULTRA TEST test strip CHECK BLOOD SUGAR ONCE A DAY  . ONETOUCH DELICA LANCETS 62I MISC CHECK BLOOD SUGAR ONCE DAILY OR AS DIRECTED  . raloxifene (EVISTA) 60 MG tablet Take 1 tablet (60 mg total) by mouth daily.  . rosuvastatin (CRESTOR) 10 MG tablet TAKE 1 TABLET DAILY  . triamterene-hydrochlorothiazide (DYAZIDE) 37.5-25 MG capsule TAKE 1 CAPSULE BY MOUTH EVERY DAY   No facility-administered encounter medications on file as of 09/17/2016.     Allergies (verified) Prednisone;  Amoxicillin; and Tetanus toxoids   History: Past Medical History:  Diagnosis Date  . Diabetes mellitus without complication ([place])   . Diverticulosis   . Glaucoma   . Gout   . Hyperlipidemia   . Hypertension   . Osteopenia   . Plantar fasciitis   . Sinus bradycardia   . Vertigo    Past Surgical History:  Procedure Laterality Date  . ABDOMINAL HYSTERECTOMY    . CHOLECYSTECTOMY    . EYE SURGERY     cataracts  . NASAL SEPTUM SURGERY     Family History  Problem Relation Age of Onset  . Heart disease Mother   . Hypertension Mother   . Cancer Father        lymp nodes  . Hypertension Brother    Social History   Occupational History  . Not on file.   Social History Main Topics  . Smoking status: Never  Smoker  . Smokeless tobacco: Never Used  . Alcohol use No  . Drug use: No  . Sexual activity: No    Tobacco Counseling Counseling given: Not Answered Patient has never smoked  Activities of Daily Living In your present state of health, do you have any difficulty performing the following activities: 09/17/2016  Hearing? Y  Vision? Y  Difficulty concentrating or making decisions? N  Walking or climbing stairs? N  Dressing or bathing? N  Doing errands, shopping? N  Some recent data might be hidden  Patient states that she feels her hearing has decrease slightly. She has been to [facility] and she has had a hearing test. She was advised if her hearing decreased any more hearing aids may help.  Patient only wears glasses for small print. She also has glaucoma but states that it is under control at this time.   Immunizations and Health Maintenance Immunization History  Administered Date(s) Administered  . Influenza Whole 12/28/2008  . Influenza,inj,Quad PF,36+ Mos 12/03/2012, 01/01/2014, 12/31/2014, 11/28/2015  . Pneumococcal Polysaccharide-23 03/31/2010  . Zoster 01/28/2007  Patient is allergic to TDap. There are no preventive care reminders to display for this patient.  Patient Care Team: Timmothy Euler, MD as PCP - General (Family Medicine)  Indicate any recent Medical Services you may have received from other than [facility] providers in the past year (date may be approximate).     Assessment:   This is a routine wellness examination for Sara Holmes.   Hearing/Vision screen No exam data present Patient states her hearing has declined some She wears reading glasses for small print. Patient does have Glaucoma.  Dietary issues and exercise activities discussed:    Goals    . Exercise 3x per week (30 min per time)    . Have 3 meals a day      Depression Screen PHQ 2/9 Scores 09/17/2016 09/06/2016 07/20/2016 06/05/2016 03/02/2016 12/24/2015 12/15/2015  PHQ - 2 Score 0 0 0 0 0 0  0    Fall Risk Fall Risk  09/17/2016 09/06/2016 07/20/2016 06/05/2016 03/02/2016  Falls in the past year? No No No No No    Cognitive Function: MMSE - Mini Mental State Exam 09/17/2016  Orientation to time 5  Orientation to Place 5  Registration 3  Attention/ Calculation 5  Recall 2  Language- name 2 objects 2  Language- repeat 1  Language- follow 3 step command 3  Language- read & follow direction 1  Write a sentence 1  Copy design 1  Total score 29   Patient scored a  29 out of 30 on MMSE.      Screening Tests Health Maintenance  Topic Date Due  . INFLUENZA VACCINE  10/10/2016  . MAMMOGRAM  01/31/2017  . OPHTHALMOLOGY EXAM  02/22/2017  . HEMOGLOBIN A1C  03/08/2017  . FOOT EXAM  09/06/2017  . DEXA SCAN  Completed  . PNA vac Low Risk Adult  Completed      Plan:  Patient will follow up with Dr. Wendi Snipes in October. Patient will have a Dexa Scan at her next appointment.  We tries to check on the cost of shingrix but were unable. Patient states she will call her insurance company to see if they will cover.  Patient will try to start exercising 3 times a week for 30 minutes each time.   I have personally reviewed and noted the following in the patient's chart:   . Medical and social history . Use of alcohol, tobacco or illicit drugs  . Current medications and supplements . Functional ability and status . Nutritional status . Physical activity . Advanced directives . List of other physicians . Hospitalizations, surgeries, and ER visits in previous 12 months . Vitals . Screenings to include cognitive, depression, and falls . Referrals and appointments  In addition, I have reviewed and discussed with patient certain preventive protocols, quality metrics, and best practice recommendations. A written personalized care plan for preventive services as well as general preventive health recommendations were provided to patient.     Gareth Morgan, LPN   4/0/3474     I have reviewed and agree with the above AWV documentation.   Laroy Apple, MD [facility] 09/17/2016, 12:04 PM

## 2016-09-17 NOTE — Patient Instructions (Signed)
  Ms. Dauria , Thank you for taking time to come for your Medicare Wellness Visit. I appreciate your ongoing commitment to your health goals. Please review the following plan we discussed and let me know if I can assist you in the future.   These are the goals we discussed: Goals    . Exercise 3x per week (30 min per time)    . Have 3 meals a day       This is a list of the screening recommended for you and due dates:  Health Maintenance  Topic Date Due  . Flu Shot  10/10/2016  . Mammogram  01/31/2017  . Eye exam for diabetics  02/22/2017  . Hemoglobin A1C  03/08/2017  . Complete foot exam   09/06/2017  . DEXA scan (bone density measurement)  Completed  . Pneumonia vaccines  Completed   Keep follow up appointment with Dr. Wendi Snipes  Have dexa scan at next appointment. Check with insurance to see if they will cover the shingrix

## 2016-10-05 ENCOUNTER — Other Ambulatory Visit: Payer: Self-pay | Admitting: Family Medicine

## 2016-10-20 ENCOUNTER — Other Ambulatory Visit: Payer: Self-pay | Admitting: Family Medicine

## 2016-10-20 DIAGNOSIS — E1142 Type 2 diabetes mellitus with diabetic polyneuropathy: Secondary | ICD-10-CM

## 2016-10-22 ENCOUNTER — Other Ambulatory Visit: Payer: Self-pay | Admitting: Nurse Practitioner

## 2016-10-22 ENCOUNTER — Ambulatory Visit: Payer: PPO

## 2016-10-22 DIAGNOSIS — Z780 Asymptomatic menopausal state: Secondary | ICD-10-CM

## 2016-11-06 ENCOUNTER — Other Ambulatory Visit: Payer: PPO

## 2016-11-07 ENCOUNTER — Ambulatory Visit (INDEPENDENT_AMBULATORY_CARE_PROVIDER_SITE_OTHER): Payer: PPO

## 2016-11-07 DIAGNOSIS — Z780 Asymptomatic menopausal state: Secondary | ICD-10-CM | POA: Diagnosis not present

## 2016-11-07 DIAGNOSIS — Z23 Encounter for immunization: Secondary | ICD-10-CM | POA: Diagnosis not present

## 2016-11-10 ENCOUNTER — Other Ambulatory Visit: Payer: Self-pay | Admitting: Family Medicine

## 2016-11-10 DIAGNOSIS — E1142 Type 2 diabetes mellitus with diabetic polyneuropathy: Secondary | ICD-10-CM

## 2016-11-16 ENCOUNTER — Other Ambulatory Visit: Payer: Self-pay | Admitting: Family Medicine

## 2016-11-17 ENCOUNTER — Other Ambulatory Visit: Payer: Self-pay | Admitting: Family Medicine

## 2016-11-17 DIAGNOSIS — M8580 Other specified disorders of bone density and structure, unspecified site: Secondary | ICD-10-CM

## 2016-11-24 ENCOUNTER — Other Ambulatory Visit: Payer: Self-pay | Admitting: Family Medicine

## 2016-12-03 ENCOUNTER — Other Ambulatory Visit: Payer: Self-pay | Admitting: Family Medicine

## 2016-12-08 ENCOUNTER — Other Ambulatory Visit: Payer: Self-pay | Admitting: Family Medicine

## 2016-12-11 ENCOUNTER — Encounter: Payer: Self-pay | Admitting: Family Medicine

## 2016-12-11 ENCOUNTER — Ambulatory Visit (INDEPENDENT_AMBULATORY_CARE_PROVIDER_SITE_OTHER): Payer: PPO | Admitting: Family Medicine

## 2016-12-11 VITALS — BP 135/83 | HR 85 | Temp 97.6°F | Ht 60.5 in | Wt 171.2 lb

## 2016-12-11 DIAGNOSIS — E785 Hyperlipidemia, unspecified: Secondary | ICD-10-CM

## 2016-12-11 DIAGNOSIS — Z23 Encounter for immunization: Secondary | ICD-10-CM

## 2016-12-11 DIAGNOSIS — E1142 Type 2 diabetes mellitus with diabetic polyneuropathy: Secondary | ICD-10-CM | POA: Diagnosis not present

## 2016-12-11 DIAGNOSIS — R252 Cramp and spasm: Secondary | ICD-10-CM | POA: Diagnosis not present

## 2016-12-11 LAB — CBC WITH DIFFERENTIAL/PLATELET
BASOS: 0 %
Basophils Absolute: 0 10*3/uL (ref 0.0–0.2)
EOS (ABSOLUTE): 0.2 10*3/uL (ref 0.0–0.4)
Eos: 2 %
Hematocrit: 36.7 % (ref 34.0–46.6)
Hemoglobin: 12.1 g/dL (ref 11.1–15.9)
Immature Grans (Abs): 0 10*3/uL (ref 0.0–0.1)
Immature Granulocytes: 0 %
LYMPHS ABS: 1.8 10*3/uL (ref 0.7–3.1)
Lymphs: 24 %
MCH: 31.2 pg (ref 26.6–33.0)
MCHC: 33 g/dL (ref 31.5–35.7)
MCV: 95 fL (ref 79–97)
MONOS ABS: 0.6 10*3/uL (ref 0.1–0.9)
Monocytes: 9 %
NEUTROS ABS: 4.8 10*3/uL (ref 1.4–7.0)
Neutrophils: 65 %
PLATELETS: 222 10*3/uL (ref 150–379)
RBC: 3.88 x10E6/uL (ref 3.77–5.28)
RDW: 13.7 % (ref 12.3–15.4)
WBC: 7.4 10*3/uL (ref 3.4–10.8)

## 2016-12-11 LAB — CMP14+EGFR
A/G RATIO: 2.2 (ref 1.2–2.2)
ALT: 12 IU/L (ref 0–32)
AST: 20 IU/L (ref 0–40)
Albumin: 4.1 g/dL (ref 3.5–4.7)
Alkaline Phosphatase: 54 IU/L (ref 39–117)
BUN/Creatinine Ratio: 17 (ref 12–28)
BUN: 16 mg/dL (ref 8–27)
Bilirubin Total: 0.8 mg/dL (ref 0.0–1.2)
CALCIUM: 9.3 mg/dL (ref 8.7–10.3)
CO2: 28 mmol/L (ref 20–29)
Chloride: 101 mmol/L (ref 96–106)
Creatinine, Ser: 0.96 mg/dL (ref 0.57–1.00)
GFR, EST AFRICAN AMERICAN: 62 mL/min/{1.73_m2} (ref >59)
GFR, EST NON AFRICAN AMERICAN: 54 mL/min/{1.73_m2} — AB (ref >59)
GLOBULIN, TOTAL: 1.9 g/dL (ref 1.5–4.5)
Glucose: 150 mg/dL — ABNORMAL HIGH (ref 65–99)
POTASSIUM: 4.1 mmol/L (ref 3.5–5.2)
SODIUM: 143 mmol/L (ref 134–144)
TOTAL PROTEIN: 6 g/dL (ref 6.0–8.5)

## 2016-12-11 LAB — LIPID PANEL
Chol/HDL Ratio: 2.3 ratio (ref 0.0–4.4)
Cholesterol, Total: 131 mg/dL (ref 100–199)
HDL: 57 mg/dL (ref >39)
LDL Calculated: 48 mg/dL (ref 0–99)
Triglycerides: 128 mg/dL (ref 0–149)
VLDL CHOLESTEROL CAL: 26 mg/dL (ref 5–40)

## 2016-12-11 LAB — BAYER DCA HB A1C WAIVED: HB A1C (BAYER DCA - WAIVED): 7.5 % — ABNORMAL HIGH (ref <7.0)

## 2016-12-11 NOTE — Progress Notes (Signed)
   HPI  Patient presents today here to follow-up for chronic medical conditions.  She is fasting today, she does not need any refills.  She reports good medication compliance and has no complaints except for leg cramps. These are rare, they improved very good with mustard.  She also states that she started taking 1000 g by 10 for hair thinning.  Fasting blood sugar 130-140 She's not watching her diet, she does have very good medication compliance. No side effects from medication.  Hyperlipidemia Good medication compliance with Crestor, tolerating well.   PMH: Smoking status noted ROS: Per HPI  Objective: BP 135/83   Pulse 85   Temp 97.6 F (36.4 C) (Oral)   Ht 5' 0.5" (1.537 m)   Wt 171 lb 3.2 oz (77.7 kg)   BMI 32.88 kg/m  Gen: NAD, alert, cooperative with exam HEENT: NCAT, EOMI, PERRL CV: RRR, good S1/S2, no murmur Resp: CTABL, no wheezes, non-labored Abd: SNTND, BS present, no guarding or organomegaly Ext: No edema, warm Neuro: Alert and oriented, No gross deficits  Diabetic Foot Exam - Simple   Simple Foot Form Diabetic Foot exam was performed with the following findings:  Yes 12/11/2016  9:55 AM  Visual Inspection No deformities, no ulcerations, no other skin breakdown bilaterally:  Yes Sensation Testing Intact to touch and monofilament testing bilaterally:  Yes Pulse Check Posterior Tibialis and Dorsalis pulse intact bilaterally:  Yes Comments      Assessment and plan:  # Type 2 diabetes, diabetic neuropathy Patient with controlled diabetic neuropathy with gabapentin Type 2 diabetes well controlled with current medications No changes, monitoring kidney function closely with reduced GFR.  # Leg cramps Improved quite a bit with mustard, controlled No changes discussed other ideas if needed  # Hyperlipidemia Repeat labs Doing well with Crestor   # Encounter for immunization of influenza Counseling  provided for all immunizations and  components    Orders Placed This Encounter  Procedures  . Flu vaccine HIGH DOSE PF  . Bayer DCA Hb A1c Waived  . CMP14+EGFR  . CBC with Differential/Platelet  . Lipid panel     Laroy Apple, MD [facility] 12/11/2016, 9:56 AM

## 2016-12-12 ENCOUNTER — Encounter: Payer: Self-pay | Admitting: Family Medicine

## 2016-12-17 ENCOUNTER — Other Ambulatory Visit: Payer: Self-pay | Admitting: Family Medicine

## 2016-12-17 DIAGNOSIS — E1142 Type 2 diabetes mellitus with diabetic polyneuropathy: Secondary | ICD-10-CM

## 2016-12-20 ENCOUNTER — Other Ambulatory Visit: Payer: Self-pay | Admitting: Family Medicine

## 2016-12-20 DIAGNOSIS — Z1231 Encounter for screening mammogram for malignant neoplasm of breast: Secondary | ICD-10-CM

## 2016-12-24 ENCOUNTER — Other Ambulatory Visit: Payer: Self-pay | Admitting: Family Medicine

## 2016-12-24 DIAGNOSIS — E1142 Type 2 diabetes mellitus with diabetic polyneuropathy: Secondary | ICD-10-CM

## 2017-01-03 DIAGNOSIS — B351 Tinea unguium: Secondary | ICD-10-CM | POA: Diagnosis not present

## 2017-01-03 DIAGNOSIS — M79676 Pain in unspecified toe(s): Secondary | ICD-10-CM | POA: Diagnosis not present

## 2017-01-03 DIAGNOSIS — E1142 Type 2 diabetes mellitus with diabetic polyneuropathy: Secondary | ICD-10-CM | POA: Diagnosis not present

## 2017-01-03 DIAGNOSIS — L84 Corns and callosities: Secondary | ICD-10-CM | POA: Diagnosis not present

## 2017-01-12 ENCOUNTER — Other Ambulatory Visit: Payer: Self-pay | Admitting: Family Medicine

## 2017-01-12 DIAGNOSIS — I10 Essential (primary) hypertension: Secondary | ICD-10-CM

## 2017-01-12 DIAGNOSIS — E1142 Type 2 diabetes mellitus with diabetic polyneuropathy: Secondary | ICD-10-CM

## 2017-02-08 ENCOUNTER — Other Ambulatory Visit: Payer: Self-pay

## 2017-02-08 DIAGNOSIS — E785 Hyperlipidemia, unspecified: Secondary | ICD-10-CM

## 2017-02-08 DIAGNOSIS — M8580 Other specified disorders of bone density and structure, unspecified site: Secondary | ICD-10-CM

## 2017-02-08 MED ORDER — ROSUVASTATIN CALCIUM 10 MG PO TABS: 10.0000 mg | ORAL_TABLET | Freq: Every day | ORAL | 4 refills | Status: DC

## 2017-02-08 MED ORDER — ROSUVASTATIN CALCIUM 10 MG PO TABS: ORAL_TABLET | ORAL | 4 refills | Status: DC

## 2017-02-11 ENCOUNTER — Ambulatory Visit (HOSPITAL_COMMUNITY)
Admission: RE | Admit: 2017-02-11 | Discharge: 2017-02-11 | Disposition: A | Discharge status: Home / Self Care | Payer: PPO | Source: Ambulatory Visit | Attending: Family Medicine | Admitting: Family Medicine

## 2017-02-11 DIAGNOSIS — Z1231 Encounter for screening mammogram for malignant neoplasm of breast: Secondary | ICD-10-CM | POA: Diagnosis not present

## 2017-02-25 ENCOUNTER — Other Ambulatory Visit: Payer: Self-pay | Admitting: Family Medicine

## 2017-03-02 ENCOUNTER — Other Ambulatory Visit: Payer: Self-pay | Admitting: Family Medicine

## 2017-03-15 ENCOUNTER — Ambulatory Visit (INDEPENDENT_AMBULATORY_CARE_PROVIDER_SITE_OTHER): Payer: PPO | Admitting: Family Medicine

## 2017-03-15 ENCOUNTER — Encounter: Payer: Self-pay | Admitting: Family Medicine

## 2017-03-15 VITALS — BP 140/79 | HR 65 | Temp 97.2°F | Ht 60.5 in | Wt 172.0 lb

## 2017-03-15 DIAGNOSIS — E1142 Type 2 diabetes mellitus with diabetic polyneuropathy: Secondary | ICD-10-CM

## 2017-03-15 DIAGNOSIS — H8110 Benign paroxysmal vertigo, unspecified ear: Secondary | ICD-10-CM

## 2017-03-15 DIAGNOSIS — I10 Essential (primary) hypertension: Secondary | ICD-10-CM | POA: Diagnosis not present

## 2017-03-15 LAB — CBC WITH DIFFERENTIAL/PLATELET
BASOS: 0 %
Basophils Absolute: 0 10*3/uL (ref 0.0–0.2)
EOS (ABSOLUTE): 0.1 10*3/uL (ref 0.0–0.4)
EOS: 2 %
HEMATOCRIT: 36.7 % (ref 34.0–46.6)
Hemoglobin: 11.9 g/dL (ref 11.1–15.9)
Immature Grans (Abs): 0 10*3/uL (ref 0.0–0.1)
Immature Granulocytes: 1 %
LYMPHS ABS: 1.8 10*3/uL (ref 0.7–3.1)
Lymphs: 29 %
MCH: 31.1 pg (ref 26.6–33.0)
MCHC: 32.4 g/dL (ref 31.5–35.7)
MCV: 96 fL (ref 79–97)
Monocytes Absolute: 0.5 10*3/uL (ref 0.1–0.9)
Monocytes: 7 %
NEUTROS PCT: 61 %
Neutrophils Absolute: 3.9 10*3/uL (ref 1.4–7.0)
PLATELETS: 211 10*3/uL (ref 150–379)
RBC: 3.83 x10E6/uL (ref 3.77–5.28)
RDW: 13.9 % (ref 12.3–15.4)
WBC: 6.3 10*3/uL (ref 3.4–10.8)

## 2017-03-15 LAB — CMP14+EGFR
ALK PHOS: 56 IU/L (ref 39–117)
ALT: 10 IU/L (ref 0–32)
AST: 17 IU/L (ref 0–40)
Albumin/Globulin Ratio: 2 (ref 1.2–2.2)
Albumin: 4.1 g/dL (ref 3.5–4.7)
BUN/Creatinine Ratio: 16 (ref 12–28)
BUN: 15 mg/dL (ref 8–27)
Bilirubin Total: 0.6 mg/dL (ref 0.0–1.2)
CALCIUM: 9.1 mg/dL (ref 8.7–10.3)
CO2: 23 mmol/L (ref 20–29)
Chloride: 103 mmol/L (ref 96–106)
Creatinine, Ser: 0.93 mg/dL (ref 0.57–1.00)
GFR calc Af Amer: 64 mL/min/{1.73_m2} (ref >59)
GFR, EST NON AFRICAN AMERICAN: 56 mL/min/{1.73_m2} — AB (ref >59)
GLOBULIN, TOTAL: 2.1 g/dL (ref 1.5–4.5)
Glucose: 140 mg/dL — ABNORMAL HIGH (ref 65–99)
Potassium: 4.3 mmol/L (ref 3.5–5.2)
SODIUM: 145 mmol/L — AB (ref 134–144)
Total Protein: 6.2 g/dL (ref 6.0–8.5)

## 2017-03-15 LAB — LIPID PANEL
CHOL/HDL RATIO: 2.3 ratio (ref 0.0–4.4)
Cholesterol, Total: 125 mg/dL (ref 100–199)
HDL: 55 mg/dL (ref >39)
LDL Calculated: 46 mg/dL (ref 0–99)
TRIGLYCERIDES: 118 mg/dL (ref 0–149)
VLDL Cholesterol Cal: 24 mg/dL (ref 5–40)

## 2017-03-15 LAB — BAYER DCA HB A1C WAIVED: HB A1C (BAYER DCA - WAIVED): 7.1 % — ABNORMAL HIGH (ref <7.0)

## 2017-03-15 NOTE — Patient Instructions (Signed)
Great to see you!  Come back in 3 months unless you need [place] sooner.   Ou can start taking fosinopril at night

## 2017-03-15 NOTE — Progress Notes (Signed)
   HPI  Patient presents today here to follow-up for chronic medical conditions.  Hypertension Doing well, good medication compliance, patient has noticed few hours of transient unsteadiness type dizziness after taking medications on an empty stomach.  She takes all 3 blood pressure medications in the morning.  Diabetes Good medication compliance, watching diet well, however some indiscretions over the holidays. Average fasting blood sugar 140-150.  vertigo Patient with recent episode, states that it is almost completely resolved. This is separate from her unsteadiness type dizziness that she was describing above   PMH: Smoking status noted ROS: Per HPI  Objective: BP 140/79   Pulse 65   Temp (!) 97.2 F (36.2 C) (Oral)   Ht 5' 0.5" (1.537 m)   Wt 172 lb (78 kg)   BMI 33.04 kg/m  Gen: NAD, alert, cooperative with exam HEENT: NCAT CV: RRR, good S1/S2, no murmur Resp: CTABL, no wheezes, non-labored Abd: SNTND, BS present, no guarding or organomegaly Ext: No edema, warm Neuro: Alert and oriented, No gross deficits  Assessment and plan:  #BPPV Resolving, patient with episode of typical BPPV after sinus congestion. Patient using meclizine and doing Epley's maneuvers with some improvement  #Hypertension Reasonably well controlled considering her age, no changes. Patient with mild dizziness after taking combination of felodipine plus diuretic plus lisinopril, changed lisinopril to nighttime dosing  #Type 2 diabetes Well-controlled with A1c of 7.1, no changes    Orders Placed This Encounter  Procedures  . Bayer DCA Hb A1c Waived  . CMP14+EGFR  . Lipid panel  . CBC with [place], MD [facility] 03/15/2017, 11:24 AM

## 2017-03-16 ENCOUNTER — Other Ambulatory Visit: Payer: Self-pay | Admitting: Family Medicine

## 2017-03-16 DIAGNOSIS — M8580 Other specified disorders of bone density and structure, unspecified site: Secondary | ICD-10-CM

## 2017-03-18 ENCOUNTER — Encounter: Payer: Self-pay | Admitting: Family Medicine

## 2017-03-21 ENCOUNTER — Other Ambulatory Visit: Payer: Self-pay | Admitting: Family Medicine

## 2017-03-21 DIAGNOSIS — E1142 Type 2 diabetes mellitus with diabetic polyneuropathy: Secondary | ICD-10-CM

## 2017-04-01 DIAGNOSIS — I872 Venous insufficiency (chronic) (peripheral): Secondary | ICD-10-CM | POA: Diagnosis not present

## 2017-04-09 DIAGNOSIS — M79676 Pain in unspecified toe(s): Secondary | ICD-10-CM | POA: Diagnosis not present

## 2017-04-09 DIAGNOSIS — B351 Tinea unguium: Secondary | ICD-10-CM | POA: Diagnosis not present

## 2017-04-09 DIAGNOSIS — E1142 Type 2 diabetes mellitus with diabetic polyneuropathy: Secondary | ICD-10-CM | POA: Diagnosis not present

## 2017-04-09 DIAGNOSIS — L84 Corns and callosities: Secondary | ICD-10-CM | POA: Diagnosis not present

## 2017-04-12 ENCOUNTER — Ambulatory Visit (INDEPENDENT_AMBULATORY_CARE_PROVIDER_SITE_OTHER): Payer: PPO | Admitting: Family Medicine

## 2017-04-12 ENCOUNTER — Encounter: Payer: Self-pay | Admitting: Family Medicine

## 2017-04-12 VITALS — BP 161/70 | HR 80 | Temp 96.8°F | Ht 60.5 in | Wt 170.0 lb

## 2017-04-12 DIAGNOSIS — J189 Pneumonia, unspecified organism: Secondary | ICD-10-CM

## 2017-04-12 MED ORDER — AZITHROMYCIN 250 MG PO TABS: ORAL_TABLET | ORAL | 0 refills | Status: DC

## 2017-04-12 NOTE — Progress Notes (Signed)
   HPI  Patient presents today here with acute illness.  Patient states she has had wheezing, cough, congestion, and mild shortness of breath for about 4 days. She denies fever, chills, sweats, body aches.  She is tolerating food and fluids like usual.   PMH: Smoking status noted ROS: Per HPI  Objective: BP (!) 161/70   Pulse 80   Temp (!) 96.8 F (36 C) (Oral)   Ht 5' 0.5" (1.537 m)   Wt 170 lb (77.1 kg)   SpO2 94%   BMI 32.65 kg/m  Gen: NAD, alert, cooperative with exam HEENT: NCAT, oropharynx moist and clear, TMs normal bilaterally partially obscured on the right, nares clear CV: RRR, good S1/S2, no murmur Resp: CTABL, no wheezes, non-labored Abd: Expiratory wheezing and coarse breath sounds scattered Ext: No edema, warm Neuro: Alert and oriented, No gross deficits  Assessment and plan:  #Atypical pneumonia Treat with azithromycin Avoiding cortisone or prednisone for now, however this may be necessary later if she is not improving.   Meds ordered this encounter  Medications  . azithromycin (ZITHROMAX) 250 MG tablet    Sig: Take 2 tablets on day 1 and 1 tablet daily after that    Dispense:  6 tablet    Refill:  0    Laroy Apple, MD [place] 04/12/2017, 10:02 AM

## 2017-04-15 ENCOUNTER — Other Ambulatory Visit: Payer: Self-pay | Admitting: Family Medicine

## 2017-04-15 DIAGNOSIS — I10 Essential (primary) hypertension: Secondary | ICD-10-CM

## 2017-04-15 DIAGNOSIS — E1142 Type 2 diabetes mellitus with diabetic polyneuropathy: Secondary | ICD-10-CM

## 2017-04-15 DIAGNOSIS — M8580 Other specified disorders of bone density and structure, unspecified site: Secondary | ICD-10-CM

## 2017-04-17 ENCOUNTER — Telehealth: Payer: Self-pay | Admitting: Family Medicine

## 2017-04-17 NOTE — Telephone Encounter (Signed)
What symptoms do you have? Weak, congestion  How long have you been sick? Since last Friday, has finished taking antibiotic, feels a little better  Have you been seen for this problem? yes  If your provider decides to give you a prescription, which pharmacy would you like for it to be sent to? [facility]    Patient informed that this information will be sent to the clinical staff for review and that they should receive a follow up call.

## 2017-04-17 NOTE — Telephone Encounter (Signed)
Spoke with pt regarding sxs Pt feels better but still some fatigue Discussed resolving sxs Pt will call back if wants appt for rck

## 2017-04-24 DIAGNOSIS — H40013 Open angle with borderline findings, low risk, bilateral: Secondary | ICD-10-CM | POA: Diagnosis not present

## 2017-04-24 DIAGNOSIS — E119 Type 2 diabetes mellitus without complications: Secondary | ICD-10-CM | POA: Diagnosis not present

## 2017-04-24 DIAGNOSIS — H2589 Other age-related cataract: Secondary | ICD-10-CM | POA: Diagnosis not present

## 2017-04-24 DIAGNOSIS — Z961 Presence of intraocular lens: Secondary | ICD-10-CM | POA: Diagnosis not present

## 2017-04-24 LAB — HM DIABETES EYE EXAM

## 2017-05-02 ENCOUNTER — Other Ambulatory Visit: Payer: Self-pay | Admitting: Family Medicine

## 2017-06-14 ENCOUNTER — Encounter: Payer: Self-pay | Admitting: Family Medicine

## 2017-06-14 ENCOUNTER — Ambulatory Visit (INDEPENDENT_AMBULATORY_CARE_PROVIDER_SITE_OTHER): Payer: PPO | Admitting: Family Medicine

## 2017-06-14 VITALS — BP 171/83 | HR 85 | Temp 97.4°F | Ht 60.5 in | Wt 171.0 lb

## 2017-06-14 DIAGNOSIS — I10 Essential (primary) hypertension: Secondary | ICD-10-CM

## 2017-06-14 DIAGNOSIS — E1142 Type 2 diabetes mellitus with diabetic polyneuropathy: Secondary | ICD-10-CM | POA: Diagnosis not present

## 2017-06-14 LAB — BAYER DCA HB A1C WAIVED: HB A1C (BAYER DCA - WAIVED): 7 % — ABNORMAL HIGH (ref <7.0)

## 2017-06-14 NOTE — Progress Notes (Signed)
   HPI  Patient presents today here for follow-up chronic medical conditions.  Diabetes Good medication compliance and tolerance. Watching diet. Average fasting blood sugar is 130-150 No hypoglycemia.  Hypertension Occasion compliance is good Blood pressure at home typically is 161 systolic   PMH: Smoking status noted ROS: Per HPI  Objective: BP (!) 171/83   Pulse 85   Temp (!) 97.4 F (36.3 C) (Oral)   Ht 5' 0.5" (1.537 m)   Wt 171 lb (77.6 kg)   BMI 32.85 kg/m  Gen: NAD, alert, cooperative with exam HEENT: NCAT CV: RRR, good S1/S2, no murmur Resp: CTABL, no wheezes, non-labored Ext: No edema, warm Neuro: Alert and oriented, No gross deficits  Diabetic Foot Exam - Simple   Simple Foot Form Diabetic Foot exam was performed with the following findings:  Yes 06/14/2017 10:04 AM  Visual Inspection No deformities, no ulcerations, no other skin breakdown bilaterally:  Yes Sensation Testing Intact to touch and monofilament testing bilaterally:  Yes Pulse Check Posterior Tibialis and Dorsalis pulse intact bilaterally:  Yes Comments      Assessment and plan:  #Type 2 diabetes Well controlled with A1c of 7.0. No changes Labs  #Hypertension Elevated today, however controlled at home. No changes from current regimen    Orders Placed This Encounter  Procedures  . Bayer DCA Hb A1c Waived  . Microalbumin / creatinine urine ratio  . [place], MD [facility] 06/14/2017, 10:04 AM

## 2017-06-14 NOTE — Patient Instructions (Signed)
Great to see you!  Come back in 3 months unless you need us sooner.    

## 2017-06-15 LAB — CMP14+EGFR
A/G RATIO: 1.9 (ref 1.2–2.2)
ALT: 14 IU/L (ref 0–32)
AST: 19 IU/L (ref 0–40)
Albumin: 4 g/dL (ref 3.5–4.7)
Alkaline Phosphatase: 55 IU/L (ref 39–117)
BILIRUBIN TOTAL: 0.6 mg/dL (ref 0.0–1.2)
BUN/Creatinine Ratio: 17 (ref 12–28)
BUN: 18 mg/dL (ref 8–27)
CALCIUM: 9 mg/dL (ref 8.7–10.3)
CHLORIDE: 103 mmol/L (ref 96–106)
CO2: 23 mmol/L (ref 20–29)
Creatinine, Ser: 1.04 mg/dL — ABNORMAL HIGH (ref 0.57–1.00)
GFR calc Af Amer: 56 mL/min/{1.73_m2} — ABNORMAL LOW (ref >59)
GFR, EST NON AFRICAN AMERICAN: 49 mL/min/{1.73_m2} — AB (ref >59)
GLOBULIN, TOTAL: 2.1 g/dL (ref 1.5–4.5)
Glucose: 137 mg/dL — ABNORMAL HIGH (ref 65–99)
POTASSIUM: 4.9 mmol/L (ref 3.5–5.2)
SODIUM: 144 mmol/L (ref 134–144)
Total Protein: 6.1 g/dL (ref 6.0–8.5)

## 2017-06-15 LAB — MICROALBUMIN / CREATININE URINE RATIO
CREATININE, UR: 34.7 mg/dL
MICROALB/CREAT RATIO: 11.8 mg/g{creat} (ref 0.0–30.0)
MICROALBUM., U, RANDOM: 4.1 ug/mL

## 2017-06-20 ENCOUNTER — Other Ambulatory Visit: Payer: Self-pay | Admitting: Family Medicine

## 2017-06-20 DIAGNOSIS — E1142 Type 2 diabetes mellitus with diabetic polyneuropathy: Secondary | ICD-10-CM

## 2017-06-22 ENCOUNTER — Other Ambulatory Visit: Payer: Self-pay | Admitting: Family Medicine

## 2017-07-03 ENCOUNTER — Other Ambulatory Visit: Payer: Self-pay | Admitting: Family Medicine

## 2017-07-09 DIAGNOSIS — E1142 Type 2 diabetes mellitus with diabetic polyneuropathy: Secondary | ICD-10-CM | POA: Diagnosis not present

## 2017-07-09 DIAGNOSIS — B351 Tinea unguium: Secondary | ICD-10-CM | POA: Diagnosis not present

## 2017-07-09 DIAGNOSIS — M79676 Pain in unspecified toe(s): Secondary | ICD-10-CM | POA: Diagnosis not present

## 2017-07-09 DIAGNOSIS — L84 Corns and callosities: Secondary | ICD-10-CM | POA: Diagnosis not present

## 2017-07-13 ENCOUNTER — Other Ambulatory Visit: Payer: Self-pay | Admitting: Family Medicine

## 2017-07-13 DIAGNOSIS — E1142 Type 2 diabetes mellitus with diabetic polyneuropathy: Secondary | ICD-10-CM

## 2017-07-13 DIAGNOSIS — I10 Essential (primary) hypertension: Secondary | ICD-10-CM

## 2017-07-22 ENCOUNTER — Other Ambulatory Visit: Payer: Self-pay | Admitting: Family Medicine

## 2017-09-03 ENCOUNTER — Encounter: Payer: Self-pay | Admitting: *Deleted

## 2017-09-09 ENCOUNTER — Other Ambulatory Visit: Payer: Self-pay | Admitting: Family Medicine

## 2017-09-09 DIAGNOSIS — E1142 Type 2 diabetes mellitus with diabetic polyneuropathy: Secondary | ICD-10-CM

## 2017-09-16 ENCOUNTER — Ambulatory Visit: Payer: PPO | Admitting: Family Medicine

## 2017-09-17 ENCOUNTER — Ambulatory Visit (INDEPENDENT_AMBULATORY_CARE_PROVIDER_SITE_OTHER): Payer: PPO | Admitting: Family Medicine

## 2017-09-17 ENCOUNTER — Encounter: Payer: Self-pay | Admitting: Family Medicine

## 2017-09-17 VITALS — BP 137/81 | HR 72 | Temp 97.3°F | Ht 60.5 in | Wt 166.4 lb

## 2017-09-17 DIAGNOSIS — E1142 Type 2 diabetes mellitus with diabetic polyneuropathy: Secondary | ICD-10-CM

## 2017-09-17 LAB — BMP8+EGFR
BUN / CREAT RATIO: 17 (ref 12–28)
BUN: 17 mg/dL (ref 8–27)
CALCIUM: 9.1 mg/dL (ref 8.7–10.3)
CO2: 26 mmol/L (ref 20–29)
Chloride: 102 mmol/L (ref 96–106)
Creatinine, Ser: 1.02 mg/dL — ABNORMAL HIGH (ref 0.57–1.00)
GFR calc Af Amer: 58 mL/min/{1.73_m2} — ABNORMAL LOW (ref >59)
GFR calc non Af Amer: 50 mL/min/{1.73_m2} — ABNORMAL LOW (ref >59)
GLUCOSE: 152 mg/dL — AB (ref 65–99)
POTASSIUM: 4.2 mmol/L (ref 3.5–5.2)
SODIUM: 144 mmol/L (ref 134–144)

## 2017-09-17 LAB — BAYER DCA HB A1C WAIVED: HB A1C: 7 % — AB (ref <7.0)

## 2017-09-17 NOTE — Progress Notes (Signed)
   HPI  Patient presents today here for follow-up diabetes.  Patient reports very good medication compliance and tolerance. She is watching her diet. Average fasting blood sugars 140-160. No hypoglycemia.  PMH: Smoking status noted ROS: Per HPI  Objective: BP 137/81   Pulse 72   Temp (!) 97.3 F (36.3 C) (Oral)   Ht 5' 0.5" (1.537 m)   Wt 166 lb 6.4 oz (75.5 kg)   BMI 31.96 kg/m  Gen: NAD, alert, cooperative with exam HEENT: NCAT CV: RRR, good S1/S2, Resp: CTABL, no wheezes, non-labored Ext: No edema, warm Neuro: Alert and oriented, No gross deficits  Assessment and plan:  #Type 2 diabetes Well controlled with A1c of 7.0, no changes Metformin at max dose, she does have mild CKD BMP    Orders Placed This Encounter  Procedures  . Bayer DCA Hb A1c Waived  . [place], MD [facility] 09/17/2017, 9:52 AM

## 2017-09-17 NOTE — Patient Instructions (Signed)
Great to see you!  See Dr. Lajuana Ripple in 3 months

## 2017-09-18 ENCOUNTER — Other Ambulatory Visit: Payer: Self-pay | Admitting: Family Medicine

## 2017-09-18 DIAGNOSIS — E1142 Type 2 diabetes mellitus with diabetic polyneuropathy: Secondary | ICD-10-CM

## 2017-09-23 ENCOUNTER — Other Ambulatory Visit: Payer: Self-pay | Admitting: Family Medicine

## 2017-10-08 DIAGNOSIS — L84 Corns and callosities: Secondary | ICD-10-CM | POA: Diagnosis not present

## 2017-10-08 DIAGNOSIS — B351 Tinea unguium: Secondary | ICD-10-CM | POA: Diagnosis not present

## 2017-10-08 DIAGNOSIS — M79676 Pain in unspecified toe(s): Secondary | ICD-10-CM | POA: Diagnosis not present

## 2017-10-08 DIAGNOSIS — E1142 Type 2 diabetes mellitus with diabetic polyneuropathy: Secondary | ICD-10-CM | POA: Diagnosis not present

## 2017-10-12 ENCOUNTER — Other Ambulatory Visit: Payer: Self-pay | Admitting: Family Medicine

## 2017-10-12 DIAGNOSIS — E1142 Type 2 diabetes mellitus with diabetic polyneuropathy: Secondary | ICD-10-CM

## 2017-10-16 ENCOUNTER — Encounter: Payer: PPO | Admitting: *Deleted

## 2017-10-23 ENCOUNTER — Other Ambulatory Visit: Payer: Self-pay

## 2017-10-23 MED ORDER — ALLOPURINOL 100 MG PO TABS: ORAL_TABLET | ORAL | 2 refills | Status: DC

## 2017-11-04 ENCOUNTER — Other Ambulatory Visit: Payer: Self-pay

## 2017-11-04 MED ORDER — FOSINOPRIL SODIUM 40 MG PO TABS: ORAL_TABLET | ORAL | 4 refills | Status: DC

## 2017-11-05 ENCOUNTER — Ambulatory Visit (INDEPENDENT_AMBULATORY_CARE_PROVIDER_SITE_OTHER): Payer: PPO | Admitting: *Deleted

## 2017-11-05 ENCOUNTER — Encounter: Payer: Self-pay | Admitting: *Deleted

## 2017-11-05 VITALS — BP 136/69 | HR 69 | Ht 60.5 in | Wt 165.0 lb

## 2017-11-05 DIAGNOSIS — Z0000 Encounter for general adult medical examination without abnormal findings: Secondary | ICD-10-CM

## 2017-11-05 NOTE — Patient Instructions (Signed)
Please work on your goal of having 3 balance meals per day.  If you do not feel like having a meal with protein you could have a Boost or Ensure shake.  Please review the information given on [place], and if you complete this please bring a copy to our office to be filed in your chart.  Thank you for coming in for your Annual Wellness Visit today!    Preventive Care 81 Years and Older, Female Preventive care refers to lifestyle choices and visits with your health care provider that can promote health and wellness. What does preventive care include?  A yearly physical exam. This is also called an annual well check.  Dental exams once or twice a year.  Routine eye exams. Ask your health care provider how often you should have your eyes checked.  Personal lifestyle choices, including: ? Daily care of your teeth and gums. ? Regular physical activity. ? Eating a healthy diet. ? Avoiding tobacco and drug use. ? Limiting alcohol use. ? Practicing safe sex. ? Taking low-dose aspirin every day. ? Taking vitamin and mineral supplements as recommended by your health care provider. What happens during an annual well check? The services and screenings done by your health care provider during your annual well check will depend on your age, overall health, lifestyle risk factors, and family history of disease. Counseling Your health care provider may ask you questions about your:  Alcohol use.  Tobacco use.  Drug use.  Emotional well-being.  Home and relationship well-being.  Sexual activity.  Eating habits.  History of falls.  Memory and ability to understand (cognition).  Work and work Statistician.  Reproductive health.  Screening You may have the following tests or measurements:  Height, weight, and BMI.  Blood pressure.  Lipid and cholesterol levels. These may be checked every 5 years, or more frequently if you are over 59 years old.  Skin  check.  Lung cancer screening. You may have this screening every year starting at age 89 if you have a 30-pack-year history of smoking and currently smoke or have quit within the past 15 years.  Fecal occult blood test (FOBT) of the stool. You may have this test every year starting at age 4.  Flexible sigmoidoscopy or colonoscopy. You may have a sigmoidoscopy every 5 years or a colonoscopy every 10 years starting at age 95.  Hepatitis C blood test.  Hepatitis B blood test.  Sexually transmitted disease (STD) testing.  Diabetes screening. This is done by checking your blood sugar (glucose) after you have not eaten for a while (fasting). You may have this done every 1-3 years.  Bone density scan. This is done to screen for osteoporosis. You may have this done starting at age 35.  Mammogram. This may be done every 1-2 years. Talk to your health care provider about how often you should have regular mammograms.  Talk with your health care provider about your test results, treatment options, and if necessary, the need for more tests. Vaccines Your health care provider may recommend certain vaccines, such as:  Influenza vaccine. This is recommended every year.  Tetanus, diphtheria, and acellular pertussis (Tdap, Td) vaccine. You may need a Td booster every 10 years.  Varicella vaccine. You may need this if you have not been vaccinated.  Zoster vaccine. You may need this after age 28.  Measles, mumps, and rubella (MMR) vaccine. You may need at least one dose of MMR if you were born  in 1957 or later. You may also need a second dose.  Pneumococcal 13-valent conjugate (PCV13) vaccine. One dose is recommended after age 27.  Pneumococcal polysaccharide (PPSV23) vaccine. One dose is recommended after age 59.  Meningococcal vaccine. You may need this if you have certain conditions.  Hepatitis A vaccine. You may need this if you have certain conditions or if you travel or work in places  where you may be exposed to hepatitis A.  Hepatitis B vaccine. You may need this if you have certain conditions or if you travel or work in places where you may be exposed to hepatitis B.  Haemophilus influenzae type b (Hib) vaccine. You may need this if you have certain conditions.  Talk to your health care provider about which screenings and vaccines you need and how often you need them. This information is not intended to replace advice given to you by your health care provider. Make sure you discuss any questions you have with your health care provider. Document Released: 03/25/2015 Document Revised: 11/16/2015 Document Reviewed: 12/28/2014 Elsevier Interactive Patient Education  [employer].

## 2017-11-07 NOTE — Progress Notes (Signed)
Subjective:   Sara Holmes is a 84 y.o. female who presents for Medicare Annual (Subsequent) preventive examination.  Sara Holmes retired from [employer] in 1995.  She enjoys reading, doing adult coloring books, saduko, and crocheting.  She enjoys attending church weekly, and has lunch out with a group of her cousins and a group of retiree's monthly.  She is widowed and lives alone.  She does not have children or pets.  Sara Holmes feels her health is about the same this year as it was last year.  She reports no hospitalizations or ER visits in the past year.  Her only surgeries this year were bilateral cataract removal.     Review of Systems:  All negative today       Objective:     Vitals: BP 136/69   Pulse 69   Ht 5' 0.5" (1.537 m)   Wt 165 lb (74.8 kg)   BMI 31.69 kg/m   Body mass index is 31.69 kg/m.  Advanced Directives 11/07/2017 09/17/2016 01/01/2014  Does Patient Have a Medical Advance Directive? No Yes No  Type of Advance Directive - Living will -  Would patient like information on creating a medical advance directive? Yes (MAU/Ambulatory/Procedural Areas - Information given) - Yes - Educational materials given   Patient states she has a living will, no health care power of attorney.  Advance Directives paper work given regarding naming health care power of attorney  Tobacco Social History   Tobacco Use  Smoking Status Never Smoker  Smokeless Tobacco Never Used     Counseling given: No   Clinical Intake:     Pain Score: 0-No pain                 Past Medical History:  Diagnosis Date  . Diabetes mellitus without complication ([place])   . Diverticulosis   . Glaucoma   . Gout   . Hyperlipidemia   . Hypertension   . Osteopenia   . Plantar fasciitis   . Sinus bradycardia   . Vertigo    Past Surgical History:  Procedure Laterality Date  . ABDOMINAL HYSTERECTOMY    . CHOLECYSTECTOMY    . EYE SURGERY     cataracts  . NASAL SEPTUM SURGERY      Family History  Problem Relation Age of Onset  . Heart disease Mother   . Hypertension Mother   . Cancer Father        lymp nodes  . Hypertension Brother    Social History   Socioeconomic History  . Marital status: Widowed    Spouse name: Not on file  . Number of children: Not on file  . Years of education: Not on file  . Highest education level: Not on file  Occupational History  . Not on file  Social Needs  . Financial resource strain: Not on file  . Food insecurity:    Worry: Not on file    Inability: Not on file  . Transportation needs:    Medical: Not on file    Non-medical: Not on file  Tobacco Use  . Smoking status: Never Smoker  . Smokeless tobacco: Never Used  Substance and Sexual Activity  . Alcohol use: No  . Drug use: No  . Sexual activity: Never  Lifestyle  . Physical activity:    Days per week: Not on file    Minutes per session: Not on file  . Stress: Not on file  Relationships  . Social connections:  Talks on phone: Not on file    Gets together: Not on file    Attends religious service: Not on file    Active member of club or organization: Not on file    Attends meetings of clubs or organizations: Not on file    Relationship status: Not on file  Other Topics Concern  . Not on file  Social History Narrative  . Not on file    Outpatient Encounter Medications as of 11/05/2017  Medication Sig  . allopurinol (ZYLOPRIM) 100 MG tablet Take 1 tablet (100 mg total) by mouth daily. (Patient taking differently: Take 1 tablet (100 mg total) by mouth daily except does not take on Sundays)  . aspirin 81 MG tablet Take 81 mg by mouth daily.  . Calcium Carbonate-Vitamin D (CALTRATE 600+D PO) Take 1 tablet by mouth 2 (two) times daily.   . Cholecalciferol (VITAMIN D3) 1000 UNITS CAPS Take 1 tablet by mouth daily.    . diclofenac sodium (VOLTAREN) 1 % GEL Apply 4 g topically 4 (four) times daily. (Patient taking differently: Apply 4 g topically 4 (four)  times daily as needed. )  . felodipine (PLENDIL) 2.5 MG 24 hr tablet TAKE 1 TABLET (2.5 MG TOTAL) BY MOUTH DAILY.  . fosinopril (MONOPRIL) 40 MG tablet TAKE 1 TABLET (40 MG TOTAL) BY MOUTH DAILY.  Marland Kitchen gabapentin (NEURONTIN) 300 MG capsule TAKE (1) CAPSULE DAILY  . meclizine (ANTIVERT) 25 MG tablet TAKE (1) TABLET THREE TIMES DAILY AS NEEDED.  . metFORMIN (GLUCOPHAGE) 1000 MG tablet TAKE 1 TABLET in the am and 1/2 tablet by mouth every pm  . ONE TOUCH ULTRA TEST test strip CHECK BLOOD SUGAR ONCE A DAY  . ONETOUCH DELICA LANCETS 62E MISC CHECK BLOOD SUGAR ONCE DAILY OR AS DIRECTED  . raloxifene (EVISTA) 60 MG tablet TAKE 1 TABLET EVERY DAY  . rosuvastatin (CRESTOR) 10 MG tablet 1/2 tablet daily  . triamterene-hydrochlorothiazide (DYAZIDE) 37.5-25 MG capsule TAKE 1 CAPSULE BY MOUTH EVERY DAY  . fluticasone (CUTIVATE) 0.05 % cream Apply topically 2 (two) times daily. (Patient not taking: Reported on 11/05/2017)   No facility-administered encounter medications on file as of 11/05/2017.     Activities of Daily Living In your present state of health, do you have any difficulty performing the following activities: 11/05/2017  Hearing? Y  Comment slight decrease in hearing, not too bothersome  Vision? N  Difficulty concentrating or making decisions? N  Walking or climbing stairs? N  Dressing or bathing? N  Doing errands, shopping? N  Preparing Food and eating ? N  Using the Toilet? N  In the past six months, have you accidently leaked urine? N  Do you have problems with loss of bowel control? N  Managing your Medications? N  Managing your Finances? N  Housekeeping or managing your Housekeeping? N  Some recent data might be hidden    Patient Care Team: Timmothy Euler, MD as PCP - General (Family Medicine) Clent Jacks, MD as Consulting Physician (Ophthalmology)    Assessment:   This is a routine wellness examination for [name].  Exercise Activities and Dietary recommendations  Mrs.  Paulding states she usually eats 2 meals per day.  Usually eats cereal or bagel for breakfast, and a sandwich and vegetables later in the day.  She does not enjoy eating very much meat.  Recommended a diet of 3 balanced meals per day- mostly lean proteins, vegetables, and fruits and whole grains in moderation.  Discussed including a Boost or  Ensure shake daily, especially if she is not having much protein that day in her meals.  Current Exercise Habits: The patient does not participate in regular exercise at present, Exercise limited by: neurologic condition(s)(Vertigo and neuropathy)  Goals    . Exercise 3x per week (30 min per time)    . Have 3 meals a day    . Have 3 meals a day       Fall Risk Fall Risk  11/07/2017 09/17/2017 06/14/2017 04/12/2017 03/15/2017  Falls in the past year? No No No No No   Is the patient's home free of loose throw rugs in walkways, pet beds, electrical cords, etc?   yes      Grab bars in the bathroom? yes      Handrails on the stairs?   yes      Adequate lighting?   Yes     Depression Screen PHQ 2/9 Scores 11/07/2017 09/17/2017 06/14/2017 04/12/2017  PHQ - 2 Score 0 0 0 0     Cognitive Function MMSE - Mini Mental State Exam 11/07/2017 09/17/2016  Orientation to time 5 5  Orientation to Place 5 5  Registration 3 3  Attention/ Calculation 5 5  Recall 2 2  Language- name 2 objects 2 2  Language- repeat 1 1  Language- follow 3 step command 3 3  Language- read & follow direction 1 1  Write a sentence 1 1  Copy design 1 1  Total score 29 29        Immunization History  Administered Date(s) Administered  . Influenza Whole 12/28/2008  . Influenza, High Dose Seasonal PF 12/11/2016  . Influenza,inj,Quad PF,6+ Mos 12/03/2012, 01/01/2014, 12/31/2014, 11/28/2015  . Pneumococcal Polysaccharide-23 03/31/2010  . Zoster 01/28/2007  . Zoster Recombinat (Shingrix) 11/07/2016, 04/02/2017    Qualifies for Shingles Vaccine? Completed   Screening Tests Health Maintenance   Topic Date Due  . INFLUENZA VACCINE  10/10/2017  . MAMMOGRAM  02/11/2018  . HEMOGLOBIN A1C  03/20/2018  . OPHTHALMOLOGY EXAM  04/24/2018  . FOOT EXAM  06/15/2018  . DEXA SCAN  Completed  . PNA vac Low Risk Adult  Completed    Cancer Screenings: Lung: Low Dose CT Chest recommended if Age 65-80 years, 30 pack-year currently smoking OR have quit w/in 15years. Patient does not qualify. Breast:  Up to date on Mammogram? Yes   Up to date of Bone Density/Dexa? Yes Colorectal: Not indicated  Additional Screenings: Hepatitis C Screening:  Not indicated     Plan:     Work on your goal of having 3 balanced meals per day.  If you do not feel like having a meal with protein include a Boost or Ensure shake. Review the information given on [place], if completed bring a copy to our office to be filed in medical record.  I have personally reviewed and noted the following in the patient's chart:   . Medical and social history . Use of alcohol, tobacco or illicit drugs  . Current medications and supplements . Functional ability and status . Nutritional status . Physical activity . Advanced directives . List of other physicians . Hospitalizations, surgeries, and ER visits in previous 12 months . Vitals . Screenings to include cognitive, depression, and falls . Referrals and appointments  In addition, I have reviewed and discussed with patient certain preventive protocols, quality metrics, and best practice recommendations. A written personalized care plan for preventive services as well as general preventive health recommendations were provided  to patient.     Denyce Robert, RN  11/07/2017

## 2017-11-15 ENCOUNTER — Other Ambulatory Visit: Payer: Self-pay

## 2017-11-15 DIAGNOSIS — M8580 Other specified disorders of bone density and structure, unspecified site: Secondary | ICD-10-CM

## 2017-11-15 MED ORDER — RALOXIFENE HCL 60 MG PO TABS: 60.0000 mg | ORAL_TABLET | Freq: Every day | ORAL | 4 refills | Status: DC

## 2017-11-27 DIAGNOSIS — L308 Other specified dermatitis: Secondary | ICD-10-CM | POA: Diagnosis not present

## 2017-11-27 DIAGNOSIS — L658 Other specified nonscarring hair loss: Secondary | ICD-10-CM | POA: Diagnosis not present

## 2017-11-27 DIAGNOSIS — L439 Lichen planus, unspecified: Secondary | ICD-10-CM | POA: Diagnosis not present

## 2017-11-27 DIAGNOSIS — I872 Venous insufficiency (chronic) (peripheral): Secondary | ICD-10-CM | POA: Diagnosis not present

## 2017-11-27 DIAGNOSIS — D485 Neoplasm of uncertain behavior of skin: Secondary | ICD-10-CM | POA: Diagnosis not present

## 2017-12-19 ENCOUNTER — Other Ambulatory Visit: Payer: Self-pay | Admitting: *Deleted

## 2017-12-19 DIAGNOSIS — E1142 Type 2 diabetes mellitus with diabetic polyneuropathy: Secondary | ICD-10-CM

## 2017-12-19 DIAGNOSIS — I10 Essential (primary) hypertension: Secondary | ICD-10-CM

## 2017-12-19 MED ORDER — TRIAMTERENE-HCTZ 37.5-25 MG PO CAPS: ORAL_CAPSULE | ORAL | 0 refills | Status: DC

## 2017-12-19 MED ORDER — GABAPENTIN 300 MG PO CAPS: ORAL_CAPSULE | ORAL | 0 refills | Status: DC

## 2017-12-20 ENCOUNTER — Ambulatory Visit (INDEPENDENT_AMBULATORY_CARE_PROVIDER_SITE_OTHER): Payer: PPO | Admitting: Family Medicine

## 2017-12-20 ENCOUNTER — Encounter: Payer: Self-pay | Admitting: Family Medicine

## 2017-12-20 VITALS — BP 144/81 | HR 73 | Temp 97.1°F | Ht 60.0 in | Wt 167.0 lb

## 2017-12-20 DIAGNOSIS — E785 Hyperlipidemia, unspecified: Secondary | ICD-10-CM

## 2017-12-20 DIAGNOSIS — I10 Essential (primary) hypertension: Secondary | ICD-10-CM | POA: Diagnosis not present

## 2017-12-20 DIAGNOSIS — E1122 Type 2 diabetes mellitus with diabetic chronic kidney disease: Secondary | ICD-10-CM

## 2017-12-20 DIAGNOSIS — E1159 Type 2 diabetes mellitus with other circulatory complications: Secondary | ICD-10-CM | POA: Diagnosis not present

## 2017-12-20 DIAGNOSIS — N183 Chronic kidney disease, stage 3 unspecified: Secondary | ICD-10-CM

## 2017-12-20 DIAGNOSIS — E1169 Type 2 diabetes mellitus with other specified complication: Secondary | ICD-10-CM

## 2017-12-20 DIAGNOSIS — Z23 Encounter for immunization: Secondary | ICD-10-CM

## 2017-12-20 DIAGNOSIS — E1142 Type 2 diabetes mellitus with diabetic polyneuropathy: Secondary | ICD-10-CM | POA: Diagnosis not present

## 2017-12-20 LAB — BAYER DCA HB A1C WAIVED: HB A1C: 7.3 % — AB (ref <7.0)

## 2017-12-20 MED ORDER — FOSINOPRIL SODIUM 40 MG PO TABS: ORAL_TABLET | ORAL | 3 refills | Status: DC

## 2017-12-20 MED ORDER — METFORMIN HCL 1000 MG PO TABS: ORAL_TABLET | ORAL | 3 refills | Status: DC

## 2017-12-20 MED ORDER — ALLOPURINOL 100 MG PO TABS: ORAL_TABLET | ORAL | 3 refills | Status: DC

## 2017-12-20 MED ORDER — ROSUVASTATIN CALCIUM 10 MG PO TABS: ORAL_TABLET | ORAL | 3 refills | Status: DC

## 2017-12-20 NOTE — Patient Instructions (Signed)
Your A1c was 7.3 today.  I have sent your medications to the pharmacy.  See me in 3 months or sooner if needed.

## 2017-12-20 NOTE — Progress Notes (Signed)
Subjective: CC: DM2 PCP: Janora Norlander, DO Sara Holmes is a 84 y.o. female presenting to clinic today for:  1. Type 2 Diabetes with hyperlipidemia and hypertension Patient reports compliance with metformin 1000 mg every morning and 500 mg every afternoon.  She is on Maxzide and fosinopril for blood pressure.  She takes Crestor for cholesterol.  She is tolerating medication without difficulty.  She reports that blood pressures typically run around 139-140/60s.  She watches salt intake and tries to stay as active as possible.  She sees Dr. Irving Shows for podiatry.   Last eye exam: Sees Dr. Katy Fitch regularly. Last foot exam: UTD Last A1c: 7.0 (09/2017) Nephropathy screen indicated?: On ACE-I Last flu, zoster and/or pneumovax: UTD  ROS: denies dizziness, visual disturbance, shortness of breath polyuria, polydipsia, unintended weight loss/gain, foot ulcerations, or chest pain.  She has gabapentin for neuropathy.  No history of foot ulcerations.  2. Gout Patient reports rare flare.  Symptoms are well controlled with allopurinol.    ROS: Per HPI  Allergies  Allergen Reactions  . Prednisone Other (See Comments)    Elevated blood sugar  . Amoxicillin Itching and Rash  . Tetanus Toxoids Rash   Past Medical History:  Diagnosis Date  . Diabetes mellitus without complication ([place])   . Diverticulosis   . Glaucoma   . Gout   . Hyperlipidemia   . Hypertension   . Osteopenia   . Plantar fasciitis   . Sinus bradycardia   . Vertigo     Current Outpatient Medications:  .  allopurinol (ZYLOPRIM) 100 MG tablet, Take 1 tablet (100 mg total) by mouth daily. (Patient taking differently: Take 1 tablet (100 mg total) by mouth daily except does not take on Sundays), Disp: 30 tablet, Rfl: 2 .  aspirin 81 MG tablet, Take 81 mg by mouth daily., Disp: , Rfl:  .  Calcium Carbonate-Vitamin D (CALTRATE 600+D PO), Take 1 tablet by mouth 2 (two) times daily. , Disp: , Rfl:  .  Cholecalciferol  (VITAMIN D3) 1000 UNITS CAPS, Take 1 tablet by mouth daily.  , Disp: , Rfl:  .  diclofenac sodium (VOLTAREN) 1 % GEL, Apply 4 g topically 4 (four) times daily. (Patient taking differently: Apply 4 g topically 4 (four) times daily as needed. ), Disp: 100 g, Rfl: 3 .  felodipine (PLENDIL) 2.5 MG 24 hr tablet, TAKE 1 TABLET (2.5 MG TOTAL) BY MOUTH DAILY., Disp: 30 tablet, Rfl: 5 .  fluticasone (CUTIVATE) 0.05 % cream, Apply topically 2 (two) times daily., Disp: 30 g, Rfl: 2 .  fosinopril (MONOPRIL) 40 MG tablet, TAKE 1 TABLET (40 MG TOTAL) BY MOUTH DAILY., Disp: 30 tablet, Rfl: 4 .  gabapentin (NEURONTIN) 300 MG capsule, TAKE (1) CAPSULE DAILY, Disp: 90 capsule, Rfl: 0 .  meclizine (ANTIVERT) 25 MG tablet, TAKE (1) TABLET THREE TIMES DAILY AS NEEDED., Disp: 30 tablet, Rfl: 0 .  metFORMIN (GLUCOPHAGE) 1000 MG tablet, TAKE 1 TABLET in the am and 1/2 tablet by mouth every pm, Disp: 45 tablet, Rfl: 2 .  ONE TOUCH ULTRA TEST test strip, CHECK BLOOD SUGAR ONCE A DAY, Disp: 100 each, Rfl: 3 .  ONETOUCH DELICA LANCETS 41L MISC, CHECK BLOOD SUGAR ONCE DAILY OR AS DIRECTED, Disp: 100 each, Rfl: 3 .  raloxifene (EVISTA) 60 MG tablet, Take 1 tablet (60 mg total) by mouth daily., Disp: 30 tablet, Rfl: 4 .  rosuvastatin (CRESTOR) 10 MG tablet, 1/2 tablet daily, Disp: 15 tablet, Rfl: 4 .  triamterene-hydrochlorothiazide (  DYAZIDE) 37.5-25 MG capsule, TAKE 1 CAPSULE BY MOUTH EVERY DAY, Disp: 90 capsule, Rfl: 0 Social History   Socioeconomic History  . Marital status: Widowed    Spouse name: Not on file  . Number of children: Not on file  . Years of education: Not on file  . Highest education level: Not on file  Occupational History  . Not on file  Social Needs  . Financial resource strain: Not on file  . Food insecurity:    Worry: Not on file    Inability: Not on file  . Transportation needs:    Medical: Not on file    Non-medical: Not on file  Tobacco Use  . Smoking status: Never Smoker  . Smokeless  tobacco: Never Used  Substance and Sexual Activity  . Alcohol use: No  . Drug use: No  . Sexual activity: Never  Lifestyle  . Physical activity:    Days per week: Not on file    Minutes per session: Not on file  . Stress: Not on file  Relationships  . Social connections:    Talks on phone: Not on file    Gets together: Not on file    Attends religious service: Not on file    Active member of club or organization: Not on file    Attends meetings of clubs or organizations: Not on file    Relationship status: Not on file  . Intimate partner violence:    Fear of current or ex partner: Not on file    Emotionally abused: Not on file    Physically abused: Not on file    Forced sexual activity: Not on file  Other Topics Concern  . Not on file  Social History Narrative  . Not on file   Family History  Problem Relation Age of Onset  . Heart disease Mother   . Hypertension Mother   . Cancer Father        lymp nodes  . Hypertension Brother     Objective: Office vital signs reviewed. BP (!) 144/81   Pulse 73   Temp (!) 97.1 F (36.2 C) (Oral)   Ht 5' (1.524 m)   Wt 167 lb (75.8 kg)   BMI 32.61 kg/m   Physical Examination:  General: Awake, alert, well nourished, No acute distress HEENT: Normal, sclera white, MMM Cardio: regular rate and rhythm, S1S2 heard, no murmurs appreciated Pulm: clear to auscultation bilaterally, no wheezes, rhonchi or rales; normal work of breathing on room air Extremities: warm, well perfused, trace pedal edema. No cyanosis or clubbing; +2 pulses bilaterally  Assessment/ Plan: 84 y.o. female   1. Type 2 diabetes mellitus with diabetic polyneuropathy, without long-term current use of insulin (HCC) Well controlled for age.  A1c 7.3.  Continue current regimen.  Refills sent - Bayer DCA Hb A1c Waived - metFORMIN (GLUCOPHAGE) 1000 MG tablet; TAKE 1 TABLET in the am and 1/2 tablet by mouth every pm  Dispense: 135 tablet; Refill: 3  2. Hypertension  associated with diabetes ([place]) Blood pressure under control for age.  No history of MI or CVA.  Continue monitoring blood pressure at home.  She is on both Monopril and Maxzide.  However, she has been on this regimen for years.  No history of hyperkalemia or arrhythmia.  We will continue to monitor intervally and low threshold to discontinue the Maxide if she ever has problems with hyperkalemia.  3. Hyperlipidemia associated with type 2 diabetes mellitus ([place]) Continue Crestor at current  dose. - rosuvastatin (CRESTOR) 10 MG tablet; 1/2 tablet daily  Dispense: 45 tablet; Refill: 3  4. CKD stage 3 due to type 2 diabetes mellitus (HCC) - fosinopril (MONOPRIL) 40 MG tablet; TAKE 1 TABLET (40 MG TOTAL) BY MOUTH DAILY.  Dispense: 90 tablet; Refill: 3  5. Encounter for immunization Flu vaccine administered. - Flu vaccine HIGH DOSE PF   Orders Placed This Encounter  Procedures  . Flu vaccine HIGH DOSE PF  . Bayer DCA Hb A1c Waived   Meds ordered this encounter  Medications  . allopurinol (ZYLOPRIM) 100 MG tablet    Sig: Take 1 tablet (100 mg total) by mouth daily.    Dispense:  90 tablet    Refill:  3  . fosinopril (MONOPRIL) 40 MG tablet    Sig: TAKE 1 TABLET (40 MG TOTAL) BY MOUTH DAILY.    Dispense:  90 tablet    Refill:  3  . metFORMIN (GLUCOPHAGE) 1000 MG tablet    Sig: TAKE 1 TABLET in the am and 1/2 tablet by mouth every pm    Dispense:  135 tablet    Refill:  3  . rosuvastatin (CRESTOR) 10 MG tablet    Sig: 1/2 tablet daily    Dispense:  45 tablet    Refill:  [place], [place] [phone]

## 2018-01-13 ENCOUNTER — Other Ambulatory Visit: Payer: Self-pay | Admitting: Family Medicine

## 2018-01-13 DIAGNOSIS — Z1231 Encounter for screening mammogram for malignant neoplasm of breast: Secondary | ICD-10-CM

## 2018-01-14 DIAGNOSIS — L84 Corns and callosities: Secondary | ICD-10-CM | POA: Diagnosis not present

## 2018-01-14 DIAGNOSIS — E1142 Type 2 diabetes mellitus with diabetic polyneuropathy: Secondary | ICD-10-CM | POA: Diagnosis not present

## 2018-01-14 DIAGNOSIS — B351 Tinea unguium: Secondary | ICD-10-CM | POA: Diagnosis not present

## 2018-01-14 DIAGNOSIS — M79676 Pain in unspecified toe(s): Secondary | ICD-10-CM | POA: Diagnosis not present

## 2018-02-14 ENCOUNTER — Ambulatory Visit (HOSPITAL_COMMUNITY): Payer: PPO

## 2018-02-18 ENCOUNTER — Other Ambulatory Visit: Payer: Self-pay | Admitting: *Deleted

## 2018-02-18 MED ORDER — MECLIZINE HCL 25 MG PO TABS: ORAL_TABLET | ORAL | 0 refills | Status: DC

## 2018-02-26 ENCOUNTER — Ambulatory Visit (HOSPITAL_COMMUNITY)
Admission: RE | Admit: 2018-02-26 | Discharge: 2018-02-26 | Disposition: A | Discharge status: Home / Self Care | Payer: PPO | Source: Ambulatory Visit | Attending: Family Medicine | Admitting: Family Medicine

## 2018-02-26 DIAGNOSIS — Z1231 Encounter for screening mammogram for malignant neoplasm of breast: Secondary | ICD-10-CM | POA: Diagnosis not present

## 2018-03-15 ENCOUNTER — Other Ambulatory Visit: Payer: Self-pay | Admitting: Family Medicine

## 2018-03-15 DIAGNOSIS — I10 Essential (primary) hypertension: Secondary | ICD-10-CM

## 2018-03-15 DIAGNOSIS — E1142 Type 2 diabetes mellitus with diabetic polyneuropathy: Secondary | ICD-10-CM

## 2018-03-17 NOTE — Telephone Encounter (Signed)
Ov 03/24/17

## 2018-03-24 ENCOUNTER — Encounter: Payer: Self-pay | Admitting: Family Medicine

## 2018-03-24 ENCOUNTER — Ambulatory Visit (INDEPENDENT_AMBULATORY_CARE_PROVIDER_SITE_OTHER): Payer: PPO | Admitting: Family Medicine

## 2018-03-24 VITALS — BP 132/76 | HR 78 | Temp 97.2°F | Ht 60.0 in | Wt 166.0 lb

## 2018-03-24 DIAGNOSIS — E785 Hyperlipidemia, unspecified: Secondary | ICD-10-CM

## 2018-03-24 DIAGNOSIS — E1142 Type 2 diabetes mellitus with diabetic polyneuropathy: Secondary | ICD-10-CM

## 2018-03-24 DIAGNOSIS — E1159 Type 2 diabetes mellitus with other circulatory complications: Secondary | ICD-10-CM | POA: Diagnosis not present

## 2018-03-24 DIAGNOSIS — E1122 Type 2 diabetes mellitus with diabetic chronic kidney disease: Secondary | ICD-10-CM

## 2018-03-24 DIAGNOSIS — E1169 Type 2 diabetes mellitus with other specified complication: Secondary | ICD-10-CM

## 2018-03-24 DIAGNOSIS — I152 Hypertension secondary to endocrine disorders: Secondary | ICD-10-CM

## 2018-03-24 DIAGNOSIS — H00011 Hordeolum externum right upper eyelid: Secondary | ICD-10-CM | POA: Diagnosis not present

## 2018-03-24 DIAGNOSIS — N183 Chronic kidney disease, stage 3 unspecified: Secondary | ICD-10-CM

## 2018-03-24 DIAGNOSIS — I10 Essential (primary) hypertension: Secondary | ICD-10-CM

## 2018-03-24 LAB — BAYER DCA HB A1C WAIVED: HB A1C: 7.3 % — AB (ref <7.0)

## 2018-03-24 NOTE — Progress Notes (Signed)
Subjective: CC: DM2 PCP: Janora Norlander, DO OAC:ZYSAY Sara Holmes is a 85 y.o. female presenting to clinic today for:  1. Type 2 Diabetes with polyneuropathy, hyperlipidemia, hypertension and CKD Patient reports compliance with metformin 1000 mg every morning and 500 mg every afternoon.  She is on Maxzide and fosinopril for blood pressure.  She takes Crestor for cholesterol.     Last eye exam: Sees Dr. Katy Fitch regularly.  She has follow-up with him in the beginning of February. Last foot exam: UTD Last A1c: 7.3 (12/2017) Nephropathy screen indicated?: On ACE-I Last flu, zoster and/or pneumovax: UTD  ROS: denies dizziness, shortness of breath polyuria, polydipsia, unintended weight loss/gain, foot ulcerations, or chest pain.  She has gabapentin for neuropathy.  No history of foot ulcerations.  She denies any visual disturbance but notes a stye on her right upper eyelid that has been present for a few weeks.  It does seem to be getting smaller.  She just wanted to have it checked out today.  2.  Stye Patient reports a stye in the right upper eyelid that is been present for a couple weeks.  Again, it is becoming smaller.  She is been using warm compresses several times daily but stopped using them about 4 days ago because it was getting smaller.  She denies any visual disturbance, drainage from the eyelid.  The swelling and redness has gotten much better but there is still slight redness present and she wanted to have this checked out today.  ROS: Per HPI  Allergies  Allergen Reactions  . Prednisone Other (See Comments)    Elevated blood sugar  . Amoxicillin Itching and Rash  . Tetanus Toxoids Rash   Past Medical History:  Diagnosis Date  . Diabetes mellitus without complication ([place])   . Diverticulosis   . Glaucoma   . Gout   . Hyperlipidemia   . Hypertension   . Osteopenia   . Plantar fasciitis   . Sinus bradycardia   . Vertigo     Current Outpatient Medications:  .   allopurinol (ZYLOPRIM) 100 MG tablet, Take 1 tablet (100 mg total) by mouth daily., Disp: 90 tablet, Rfl: 3 .  aspirin 81 MG tablet, Take 81 mg by mouth daily., Disp: , Rfl:  .  Calcium Carbonate-Vitamin D (CALTRATE 600+D PO), Take 1 tablet by mouth 2 (two) times daily. , Disp: , Rfl:  .  Cholecalciferol (VITAMIN D3) 1000 UNITS CAPS, Take 1 tablet by mouth daily.  , Disp: , Rfl:  .  diclofenac sodium (VOLTAREN) 1 % GEL, Apply 4 g topically 4 (four) times daily. (Patient taking differently: Apply 4 g topically 4 (four) times daily as needed. ), Disp: 100 g, Rfl: 3 .  felodipine (PLENDIL) 2.5 MG 24 hr tablet, TAKE 1 TABLET (2.5 MG TOTAL) BY MOUTH DAILY., Disp: 30 tablet, Rfl: 5 .  fluticasone (CUTIVATE) 0.05 % cream, Apply topically 2 (two) times daily., Disp: 30 g, Rfl: 2 .  fosinopril (MONOPRIL) 40 MG tablet, TAKE 1 TABLET (40 MG TOTAL) BY MOUTH DAILY., Disp: 90 tablet, Rfl: 3 .  gabapentin (NEURONTIN) 300 MG capsule, TAKE (1) CAPSULE DAILY, Disp: 90 capsule, Rfl: 0 .  meclizine (ANTIVERT) 25 MG tablet, TAKE (1) TABLET THREE TIMES DAILY AS NEEDED for dizziness, Disp: 30 tablet, Rfl: 0 .  metFORMIN (GLUCOPHAGE) 1000 MG tablet, TAKE 1 TABLET in the am and 1/2 tablet by mouth every pm, Disp: 135 tablet, Rfl: 3 .  ONE TOUCH ULTRA TEST test strip,  CHECK BLOOD SUGAR ONCE A DAY, Disp: 100 each, Rfl: 3 .  ONETOUCH DELICA LANCETS 63K MISC, CHECK BLOOD SUGAR ONCE DAILY OR AS DIRECTED, Disp: 100 each, Rfl: 3 .  raloxifene (EVISTA) 60 MG tablet, Take 1 tablet (60 mg total) by mouth daily., Disp: 30 tablet, Rfl: 4 .  rosuvastatin (CRESTOR) 10 MG tablet, 1/2 tablet daily, Disp: 45 tablet, Rfl: 3 .  triamterene-hydrochlorothiazide (DYAZIDE) 37.5-25 MG capsule, TAKE 1 CAPSULE BY MOUTH EVERY DAY, Disp: 90 capsule, Rfl: 0 Social History   Socioeconomic History  . Marital status: Widowed    Spouse name: Not on file  . Number of children: Not on file  . Years of education: Not on file  . Highest education level:  Not on file  Occupational History  . Not on file  Social Needs  . Financial resource strain: Not on file  . Food insecurity:    Worry: Not on file    Inability: Not on file  . Transportation needs:    Medical: Not on file    Non-medical: Not on file  Tobacco Use  . Smoking status: Never Smoker  . Smokeless tobacco: Never Used  Substance and Sexual Activity  . Alcohol use: No  . Drug use: No  . Sexual activity: Never  Lifestyle  . Physical activity:    Days per week: Not on file    Minutes per session: Not on file  . Stress: Not on file  Relationships  . Social connections:    Talks on phone: Not on file    Gets together: Not on file    Attends religious service: Not on file    Active member of club or organization: Not on file    Attends meetings of clubs or organizations: Not on file    Relationship status: Not on file  . Intimate partner violence:    Fear of current or ex partner: Not on file    Emotionally abused: Not on file    Physically abused: Not on file    Forced sexual activity: Not on file  Other Topics Concern  . Not on file  Social History Narrative  . Not on file   Family History  Problem Relation Age of Onset  . Heart disease Mother   . Hypertension Mother   . Cancer Father        lymp nodes  . Hypertension Brother     Objective: Office vital signs reviewed. BP 132/76   Pulse 78   Temp (!) 97.2 F (36.2 C) (Oral)   Ht 5' (1.524 m)   Wt 166 lb (75.3 kg)   BMI 32.42 kg/m   Physical Examination:  General: Awake, alert, well nourished, No acute distress HEENT: small hordeolum noted on the outer aspect of the right upper eyelid.  No active drainage or substantial upper eyelid swelling. sclera white, MMM Cardio: regular rate and rhythm, S1S2 heard, no murmurs appreciated Pulm: clear to auscultation bilaterally, no wheezes, rhonchi or rales; normal work of breathing on room air Extremities: warm, well perfused, No edema. No cyanosis or  clubbing; +2 pulses bilaterally Psych: Pleasant, interactive, talkative.  Assessment/ Plan: 85 y.o. female   1. Type 2 diabetes mellitus with diabetic polyneuropathy, without long-term current use of insulin (HCC) Controlled.  A1c was 7.3 today, which is appropriate for her age.  Continue current regimen with metformin ACE inhibitor and statin.   - Bayer DCA Hb A1c Waived  2. CKD stage 3 due to type 2 diabetes  mellitus ([place]) Continue ACE inhibitor  3. Diabetic polyneuropathy associated with type 2 diabetes mellitus (HCC) Continue gabapentin  4. Hyperlipidemia associated with type 2 diabetes mellitus (HCC) Continue Crestor  5. Hypertension associated with diabetes ([place]) Controlled.  Continue ACE and Dyazide    Orders Placed This Encounter  Procedures  . Bayer DCA Hb A1c Waived   No orders of the defined types were placed in this encounter.    Janora Norlander, DO [place] [phone]

## 2018-03-26 ENCOUNTER — Other Ambulatory Visit: Payer: Self-pay | Admitting: *Deleted

## 2018-03-26 MED ORDER — FELODIPINE ER 2.5 MG PO TB24: ORAL_TABLET | ORAL | 5 refills | Status: DC

## 2018-03-28 ENCOUNTER — Other Ambulatory Visit: Payer: Self-pay | Admitting: *Deleted

## 2018-03-28 MED ORDER — GLUCOSE BLOOD VI STRP: ORAL_STRIP | 3 refills | Status: DC

## 2018-04-09 ENCOUNTER — Other Ambulatory Visit: Payer: Self-pay | Admitting: Family Medicine

## 2018-04-09 DIAGNOSIS — M8580 Other specified disorders of bone density and structure, unspecified site: Secondary | ICD-10-CM

## 2018-04-15 DIAGNOSIS — L84 Corns and callosities: Secondary | ICD-10-CM | POA: Diagnosis not present

## 2018-04-15 DIAGNOSIS — E1142 Type 2 diabetes mellitus with diabetic polyneuropathy: Secondary | ICD-10-CM | POA: Diagnosis not present

## 2018-04-15 DIAGNOSIS — B351 Tinea unguium: Secondary | ICD-10-CM | POA: Diagnosis not present

## 2018-04-15 DIAGNOSIS — M79676 Pain in unspecified toe(s): Secondary | ICD-10-CM | POA: Diagnosis not present

## 2018-05-19 DIAGNOSIS — E119 Type 2 diabetes mellitus without complications: Secondary | ICD-10-CM | POA: Diagnosis not present

## 2018-05-19 DIAGNOSIS — Z961 Presence of intraocular lens: Secondary | ICD-10-CM | POA: Diagnosis not present

## 2018-05-19 DIAGNOSIS — H40013 Open angle with borderline findings, low risk, bilateral: Secondary | ICD-10-CM | POA: Diagnosis not present

## 2018-06-11 ENCOUNTER — Other Ambulatory Visit: Payer: Self-pay | Admitting: Family Medicine

## 2018-06-11 DIAGNOSIS — I10 Essential (primary) hypertension: Secondary | ICD-10-CM

## 2018-06-11 DIAGNOSIS — E1142 Type 2 diabetes mellitus with diabetic polyneuropathy: Secondary | ICD-10-CM

## 2018-08-07 DIAGNOSIS — M79676 Pain in unspecified toe(s): Secondary | ICD-10-CM | POA: Diagnosis not present

## 2018-08-07 DIAGNOSIS — E1142 Type 2 diabetes mellitus with diabetic polyneuropathy: Secondary | ICD-10-CM | POA: Diagnosis not present

## 2018-08-07 DIAGNOSIS — B351 Tinea unguium: Secondary | ICD-10-CM | POA: Diagnosis not present

## 2018-08-07 DIAGNOSIS — L84 Corns and callosities: Secondary | ICD-10-CM | POA: Diagnosis not present

## 2018-08-12 ENCOUNTER — Other Ambulatory Visit: Payer: Self-pay | Admitting: *Deleted

## 2018-08-12 MED ORDER — ONETOUCH DELICA LANCETS 33G MISC: 3 refills | Status: DC

## 2018-09-09 ENCOUNTER — Other Ambulatory Visit: Payer: Self-pay | Admitting: Family Medicine

## 2018-09-09 DIAGNOSIS — E1142 Type 2 diabetes mellitus with diabetic polyneuropathy: Secondary | ICD-10-CM

## 2018-09-09 DIAGNOSIS — I10 Essential (primary) hypertension: Secondary | ICD-10-CM

## 2018-09-15 NOTE — Progress Notes (Signed)
Subjective: CC: DM2/ HTN, HLD PCP: Janora Norlander, DO Sara Holmes is a 85 y.o. female presenting to clinic today for:  1. Type 2 Diabetes with polyneuropathy, hyperlipidemia, hypertension and CKD Patient reports compliance with metformin 1000 mg every morning and 500 mg every afternoon.  She is on Maxzide and fosinopril for blood pressure.  She takes Crestor for cholesterol.     Last eye exam: Sees Dr. Katy Fitch regularly.  She has follow-up with him in the beginning of February. Last foot exam: needs  Last A1c:  Lab Results  Component Value Date   HGBA1C 7.3 (H) 03/24/2018  Nephropathy screen indicated?: On ACE-I Last flu, zoster and/or pneumovax: UTD  ROS: Denies dizziness, chest pain, shortness of breath.  She has occasional lower extremity edema and elevates her lower extremities to resolve.  She does note an episode recently where she almost touched a garden snake in her yard that was 10 feet long and this subsequently caused chest tightness all day long.  She felt tremulous and jittery after this care has been very vigilant to look before she reaches going forward.  No ongoing chest tightness or tremulousness.   ROS: Per HPI  Allergies  Allergen Reactions  . Prednisone Other (See Comments)    Elevated blood sugar  . Amoxicillin Itching and Rash  . Tetanus Toxoids Rash   Past Medical History:  Diagnosis Date  . Diabetes mellitus without complication ([place])   . Diverticulosis   . Glaucoma   . Gout   . Hyperlipidemia   . Hypertension   . Osteopenia   . Plantar fasciitis   . Sinus bradycardia   . Vertigo     Current Outpatient Medications:  .  allopurinol (ZYLOPRIM) 100 MG tablet, Take 1 tablet (100 mg total) by mouth daily., Disp: 90 tablet, Rfl: 3 .  aspirin 81 MG tablet, Take 81 mg by mouth daily., Disp: , Rfl:  .  Calcium Carbonate-Vitamin D (CALTRATE 600+D PO), Take 1 tablet by mouth 2 (two) times daily. , Disp: , Rfl:  .  Cholecalciferol (VITAMIN D3)  1000 UNITS CAPS, Take 1 tablet by mouth daily.  , Disp: , Rfl:  .  diclofenac sodium (VOLTAREN) 1 % GEL, Apply 4 g topically 4 (four) times daily. (Patient taking differently: Apply 4 g topically 4 (four) times daily as needed. ), Disp: 100 g, Rfl: 3 .  felodipine (PLENDIL) 2.5 MG 24 hr tablet, TAKE 1 TABLET (2.5 MG TOTAL) BY MOUTH DAILY., Disp: 30 tablet, Rfl: 5 .  fluticasone (CUTIVATE) 0.05 % cream, Apply topically 2 (two) times daily., Disp: 30 g, Rfl: 2 .  fosinopril (MONOPRIL) 40 MG tablet, TAKE 1 TABLET (40 MG TOTAL) BY MOUTH DAILY., Disp: 90 tablet, Rfl: 3 .  gabapentin (NEURONTIN) 300 MG capsule, TAKE (1) CAPSULE DAILY, Disp: 90 capsule, Rfl: 1 .  glucose blood (ONE TOUCH ULTRA TEST) test strip, CHECK BLOOD SUGAR ONCE A DAY, Disp: 100 each, Rfl: 3 .  meclizine (ANTIVERT) 25 MG tablet, TAKE (1) TABLET THREE TIMES DAILY AS NEEDED for dizziness, Disp: 30 tablet, Rfl: 0 .  metFORMIN (GLUCOPHAGE) 1000 MG tablet, TAKE 1 TABLET in the am and 1/2 tablet by mouth every pm, Disp: 135 tablet, Rfl: 3 .  OneTouch Delica Lancets 07M MISC, CHECK BLOOD SUGAR ONCE DAILY OR AS DIRECTED Dx E11.42, Disp: 100 each, Rfl: 3 .  raloxifene (EVISTA) 60 MG tablet, TAKE 1 TABLET DAILY, Disp: 30 tablet, Rfl: 5 .  rosuvastatin (CRESTOR) 10 MG tablet,  1/2 tablet daily, Disp: 45 tablet, Rfl: 3 .  triamterene-hydrochlorothiazide (DYAZIDE) 37.5-25 MG capsule, TAKE 1 CAPSULE BY MOUTH EVERY DAY, Disp: 90 capsule, Rfl: 1 Social History   Socioeconomic History  . Marital status: Widowed    Spouse name: Not on file  . Number of children: Not on file  . Years of education: Not on file  . Highest education level: Not on file  Occupational History  . Not on file  Social Needs  . Financial resource strain: Not on file  . Food insecurity    Worry: Not on file    Inability: Not on file  . Transportation needs    Medical: Not on file    Non-medical: Not on file  Tobacco Use  . Smoking status: Never Smoker  . Smokeless  tobacco: Never Used  Substance and Sexual Activity  . Alcohol use: No  . Drug use: No  . Sexual activity: Never  Lifestyle  . Physical activity    Days per week: Not on file    Minutes per session: Not on file  . Stress: Not on file  Relationships  . Social Herbalist on phone: Not on file    Gets together: Not on file    Attends religious service: Not on file    Active member of club or organization: Not on file    Attends meetings of clubs or organizations: Not on file    Relationship status: Not on file  . Intimate partner violence    Fear of current or ex partner: Not on file    Emotionally abused: Not on file    Physically abused: Not on file    Forced sexual activity: Not on file  Other Topics Concern  . Not on file  Social History Narrative  . Not on file   Family History  Problem Relation Age of Onset  . Heart disease Mother   . Hypertension Mother   . Cancer Father        lymp nodes  . Hypertension Brother     Objective: Office vital signs reviewed. BP 130/86   Pulse 87   Temp 97.6 F (36.4 C) (Oral)   Ht 5' (1.524 m)   Wt 160 lb (72.6 kg)   BMI 31.25 kg/m   Physical Examination:  General: Awake, alert, well nourished, No acute distress HEENT: sclera white, MMM Cardio: regular rate and rhythm, S1S2 heard, no murmurs appreciated Pulm: clear to auscultation bilaterally, no wheezes, rhonchi or rales; normal work of breathing on room air Extremities: warm, well perfused w/ trace pedal edema. No cyanosis or clubbing; +2 pulses bilaterally Psych: Pleasant, interactive, talkative. Neuro: see DM foot below  Diabetic Foot Exam - Simple   Simple Foot Form Diabetic Foot exam was performed with the following findings: Yes 09/22/2018  9:59 AM  Visual Inspection No deformities, no ulcerations, no other skin breakdown bilaterally: Yes Sensation Testing Intact to touch and monofilament testing bilaterally: Yes Pulse Check See comments: Yes Comments  +1 pedal pulses bilaterally.  Mild edema that is nonpitting.  Vibratory sensation intact bilaterally      Assessment/ Plan: 85 y.o. female   1. Type 2 diabetes mellitus with diabetic polyneuropathy, without long-term current use of insulin (HCC) Controlled for age at 7.1.  Continue current regimen - Bayer DCA Hb A1c Waived - metFORMIN (GLUCOPHAGE) 1000 MG tablet; TAKE 1 TABLET in the am and 1/2 tablet by mouth every pm  Dispense: 135 tablet; Refill: 3  2. CKD stage 3 due to type 2 diabetes mellitus (HCC) Check BMP.  She has been on both ACE inhibitor and triamterene but has not had any problems with hyperkalemia in the past. - CMP14+EGFR - fosinopril (MONOPRIL) 40 MG tablet; TAKE 1 TABLET (40 MG TOTAL) BY MOUTH DAILY.  Dispense: 90 tablet; Refill: 3  3. Hyperlipidemia associated with type 2 diabetes mellitus (HCC) Lipid panel obtained - Lipid Panel - CMP14+EGFR - rosuvastatin (CRESTOR) 10 MG tablet; 1/2 tablet daily  Dispense: 45 tablet; Refill: 3  4. Hypertension associated with diabetes ([place]) Controlled. - CMP14+EGFR  5. Diabetic polyneuropathy associated with type 2 diabetes mellitus (HCC) Stable.  Foot exam performed today - gabapentin (NEURONTIN) 300 MG capsule; Take 1 capsule (300 mg total) by mouth at bedtime.  Dispense: 90 capsule; Refill: 1  6. Osteopenia Evista renewed. - raloxifene (EVISTA) 60 MG tablet; Take 1 tablet (60 mg total) by mouth daily.  Dispense: 90 tablet; Refill: 3    No orders of the defined types were placed in this encounter.  No orders of the defined types were placed in this encounter.    Janora Norlander, DO [place] [phone]

## 2018-09-17 ENCOUNTER — Other Ambulatory Visit: Payer: Self-pay | Admitting: Family Medicine

## 2018-09-19 ENCOUNTER — Other Ambulatory Visit: Payer: Self-pay

## 2018-09-22 ENCOUNTER — Encounter: Payer: Self-pay | Admitting: Family Medicine

## 2018-09-22 ENCOUNTER — Ambulatory Visit (INDEPENDENT_AMBULATORY_CARE_PROVIDER_SITE_OTHER): Payer: PPO | Admitting: Family Medicine

## 2018-09-22 ENCOUNTER — Other Ambulatory Visit: Payer: Self-pay

## 2018-09-22 VITALS — BP 130/86 | HR 87 | Temp 97.6°F | Ht 60.0 in | Wt 160.0 lb

## 2018-09-22 DIAGNOSIS — N183 Chronic kidney disease, stage 3 (moderate): Secondary | ICD-10-CM

## 2018-09-22 DIAGNOSIS — E1169 Type 2 diabetes mellitus with other specified complication: Secondary | ICD-10-CM

## 2018-09-22 DIAGNOSIS — E1122 Type 2 diabetes mellitus with diabetic chronic kidney disease: Secondary | ICD-10-CM

## 2018-09-22 DIAGNOSIS — I10 Essential (primary) hypertension: Secondary | ICD-10-CM

## 2018-09-22 DIAGNOSIS — E1142 Type 2 diabetes mellitus with diabetic polyneuropathy: Secondary | ICD-10-CM | POA: Diagnosis not present

## 2018-09-22 DIAGNOSIS — E785 Hyperlipidemia, unspecified: Secondary | ICD-10-CM | POA: Diagnosis not present

## 2018-09-22 DIAGNOSIS — E1159 Type 2 diabetes mellitus with other circulatory complications: Secondary | ICD-10-CM | POA: Diagnosis not present

## 2018-09-22 DIAGNOSIS — M8580 Other specified disorders of bone density and structure, unspecified site: Secondary | ICD-10-CM

## 2018-09-22 LAB — BAYER DCA HB A1C WAIVED: HB A1C (BAYER DCA - WAIVED): 7.1 % — ABNORMAL HIGH (ref <7.0)

## 2018-09-22 MED ORDER — GABAPENTIN 300 MG PO CAPS: 300.0000 mg | ORAL_CAPSULE | Freq: Every day | ORAL | 1 refills | Status: DC

## 2018-09-22 MED ORDER — RALOXIFENE HCL 60 MG PO TABS: 60.0000 mg | ORAL_TABLET | Freq: Every day | ORAL | 3 refills | Status: DC

## 2018-09-22 MED ORDER — METFORMIN HCL 1000 MG PO TABS: ORAL_TABLET | ORAL | 3 refills | Status: DC

## 2018-09-22 MED ORDER — ROSUVASTATIN CALCIUM 10 MG PO TABS: ORAL_TABLET | ORAL | 3 refills | Status: DC

## 2018-09-22 MED ORDER — FELODIPINE ER 2.5 MG PO TB24: ORAL_TABLET | ORAL | 3 refills | Status: DC

## 2018-09-22 MED ORDER — FOSINOPRIL SODIUM 40 MG PO TABS: ORAL_TABLET | ORAL | 3 refills | Status: DC

## 2018-09-22 NOTE — Patient Instructions (Addendum)
You had labs performed today.  You will be contacted with the results of the labs once they are available, usually in the next 3 business days for routine lab work.  If you have an active my chart account, they will be released to your MyChart.  If you prefer to have these labs released to you via telephone, please let us know.  If you had a pap smear or biopsy performed, expect to be contacted in about 7-10 days.   Get your diabetic eye exam done.   See me back in 6 months

## 2018-09-23 LAB — CMP14+EGFR
ALT: 10 IU/L (ref 0–32)
AST: 23 IU/L (ref 0–40)
Albumin/Globulin Ratio: 2.8 — ABNORMAL HIGH (ref 1.2–2.2)
Albumin: 4.2 g/dL (ref 3.6–4.6)
Alkaline Phosphatase: 53 IU/L (ref 39–117)
BUN/Creatinine Ratio: 14 (ref 12–28)
BUN: 13 mg/dL (ref 8–27)
Bilirubin Total: 0.8 mg/dL (ref 0.0–1.2)
CO2: 26 mmol/L (ref 20–29)
Calcium: 9.6 mg/dL (ref 8.7–10.3)
Chloride: 103 mmol/L (ref 96–106)
Creatinine, Ser: 0.91 mg/dL (ref 0.57–1.00)
GFR calc Af Amer: 66 mL/min/{1.73_m2} (ref >59)
GFR calc non Af Amer: 57 mL/min/{1.73_m2} — ABNORMAL LOW (ref >59)
Globulin, Total: 1.5 g/dL (ref 1.5–4.5)
Glucose: 148 mg/dL — ABNORMAL HIGH (ref 65–99)
Potassium: 4.1 mmol/L (ref 3.5–5.2)
Sodium: 144 mmol/L (ref 134–144)
Total Protein: 5.7 g/dL — ABNORMAL LOW (ref 6.0–8.5)

## 2018-09-23 LAB — LIPID PANEL
Chol/HDL Ratio: 2.2 ratio (ref 0.0–4.4)
Cholesterol, Total: 128 mg/dL (ref 100–199)
HDL: 59 mg/dL (ref >39)
LDL Calculated: 41 mg/dL (ref 0–99)
Triglycerides: 142 mg/dL (ref 0–149)
VLDL Cholesterol Cal: 28 mg/dL (ref 5–40)

## 2018-11-04 DIAGNOSIS — L84 Corns and callosities: Secondary | ICD-10-CM | POA: Diagnosis not present

## 2018-11-04 DIAGNOSIS — E1142 Type 2 diabetes mellitus with diabetic polyneuropathy: Secondary | ICD-10-CM | POA: Diagnosis not present

## 2018-11-04 DIAGNOSIS — B351 Tinea unguium: Secondary | ICD-10-CM | POA: Diagnosis not present

## 2018-11-04 DIAGNOSIS — M79676 Pain in unspecified toe(s): Secondary | ICD-10-CM | POA: Diagnosis not present

## 2019-01-03 ENCOUNTER — Other Ambulatory Visit: Payer: Self-pay | Admitting: Family Medicine

## 2019-01-20 ENCOUNTER — Other Ambulatory Visit (HOSPITAL_COMMUNITY): Payer: Self-pay | Admitting: Family Medicine

## 2019-01-20 DIAGNOSIS — Z1231 Encounter for screening mammogram for malignant neoplasm of breast: Secondary | ICD-10-CM

## 2019-01-26 DIAGNOSIS — I872 Venous insufficiency (chronic) (peripheral): Secondary | ICD-10-CM | POA: Diagnosis not present

## 2019-01-26 DIAGNOSIS — L821 Other seborrheic keratosis: Secondary | ICD-10-CM | POA: Diagnosis not present

## 2019-01-26 DIAGNOSIS — D225 Melanocytic nevi of trunk: Secondary | ICD-10-CM | POA: Diagnosis not present

## 2019-02-03 DIAGNOSIS — E1142 Type 2 diabetes mellitus with diabetic polyneuropathy: Secondary | ICD-10-CM | POA: Diagnosis not present

## 2019-02-03 DIAGNOSIS — B351 Tinea unguium: Secondary | ICD-10-CM | POA: Diagnosis not present

## 2019-02-03 DIAGNOSIS — M79676 Pain in unspecified toe(s): Secondary | ICD-10-CM | POA: Diagnosis not present

## 2019-02-03 DIAGNOSIS — L84 Corns and callosities: Secondary | ICD-10-CM | POA: Diagnosis not present

## 2019-03-02 ENCOUNTER — Ambulatory Visit (HOSPITAL_COMMUNITY): Payer: PPO

## 2019-03-09 ENCOUNTER — Other Ambulatory Visit: Payer: Self-pay | Admitting: Family Medicine

## 2019-03-09 DIAGNOSIS — I10 Essential (primary) hypertension: Secondary | ICD-10-CM

## 2019-03-23 ENCOUNTER — Other Ambulatory Visit: Payer: Self-pay

## 2019-03-24 ENCOUNTER — Encounter: Payer: Self-pay | Admitting: Family Medicine

## 2019-03-24 ENCOUNTER — Ambulatory Visit (INDEPENDENT_AMBULATORY_CARE_PROVIDER_SITE_OTHER): Payer: PPO | Admitting: Family Medicine

## 2019-03-24 ENCOUNTER — Other Ambulatory Visit: Payer: Self-pay

## 2019-03-24 VITALS — BP 135/78 | HR 86 | Temp 98.6°F | Ht 60.0 in | Wt 160.0 lb

## 2019-03-24 DIAGNOSIS — E1142 Type 2 diabetes mellitus with diabetic polyneuropathy: Secondary | ICD-10-CM | POA: Diagnosis not present

## 2019-03-24 DIAGNOSIS — N183 Chronic kidney disease, stage 3 unspecified: Secondary | ICD-10-CM

## 2019-03-24 DIAGNOSIS — I10 Essential (primary) hypertension: Secondary | ICD-10-CM | POA: Diagnosis not present

## 2019-03-24 DIAGNOSIS — E1122 Type 2 diabetes mellitus with diabetic chronic kidney disease: Secondary | ICD-10-CM

## 2019-03-24 LAB — BAYER DCA HB A1C WAIVED: HB A1C (BAYER DCA - WAIVED): 6.8 % (ref <7.0)

## 2019-03-24 MED ORDER — ALLOPURINOL 100 MG PO TABS: ORAL_TABLET | ORAL | 0 refills | Status: DC

## 2019-03-24 MED ORDER — GABAPENTIN 300 MG PO CAPS: 300.0000 mg | ORAL_CAPSULE | Freq: Every day | ORAL | 1 refills | Status: DC

## 2019-03-24 MED ORDER — TRIAMTERENE-HCTZ 37.5-25 MG PO CAPS: 1.0000 | ORAL_CAPSULE | Freq: Every day | ORAL | 0 refills | Status: DC

## 2019-03-24 NOTE — Patient Instructions (Signed)
Sugar looks better than last time!  A1c down to 6.8.  Keep up the good work!  You had labs performed today.  You will be contacted with the results of the labs once they are available, usually in the next 3 business days for routine lab work.  If you have an active my chart account, they will be released to your MyChart.  If you prefer to have these labs released to you via telephone, please let us know.  If you had a pap smear or biopsy performed, expect to be contacted in about 7-10 days.  Don't forget to have your eye doctor send me your results.

## 2019-03-24 NOTE — Progress Notes (Signed)
Subjective: CC: follow up DM2/ HTN, HLD PCP: Janora Norlander, DO [id number] Sara Holmes is a 86 y.o. female presenting to clinic today for:  1. Type 2 Diabetes with polyneuropathy, hyperlipidemia, hypertension and CKD Patient reports compliance with metformin 1000 mg every morning and 500 mg every afternoon.  She is on Maxzide and fosinopril for blood pressure.  She takes Crestor for cholesterol and gabapentin for polyneuropathy.   She admits to snacking more but does not think that she is overeating or doing anything to cause weight gain.  Last eye exam: Sees Dr. Katy Fitch regularly.  She has follow-up with him in the beginning of March. Last foot exam: UTD Last A1c:  Lab Results  Component Value Date   HGBA1C 7.1 (H) 09/22/2018  Nephropathy screen indicated?: On ACE-I Last flu, zoster and/or pneumovax: UTD  ROS: Denies Polyuria, polydipsia, falls, chest pain, shortness of breath.  Chronic numbness and tingling in LE's is well controlled with gabapentin.  No excessive daytime sedation.   ROS: Per HPI  Allergies  Allergen Reactions  . Prednisone Other (See Comments)    Elevated blood sugar  . Amoxicillin Itching and Rash  . Tetanus Toxoids Rash   Past Medical History:  Diagnosis Date  . Diabetes mellitus without complication ([place])   . Diverticulosis   . Glaucoma   . Gout   . Hyperlipidemia   . Hypertension   . Osteopenia   . Plantar fasciitis   . Sinus bradycardia   . Vertigo     Current Outpatient Medications:  .  allopurinol (ZYLOPRIM) 100 MG tablet, TAKE 1 TABLET DAILY, Disp: 90 tablet, Rfl: 0 .  aspirin 81 MG tablet, Take 81 mg by mouth daily., Disp: , Rfl:  .  Calcium Carbonate-Vitamin D (CALTRATE 600+D PO), Take 1 tablet by mouth 2 (two) times daily. , Disp: , Rfl:  .  Cholecalciferol (VITAMIN D3) 1000 UNITS CAPS, Take 1 tablet by mouth daily.  , Disp: , Rfl:  .  diclofenac sodium (VOLTAREN) 1 % GEL, Apply 4 g topically 4 (four) times daily. (Patient taking  differently: Apply 4 g topically 4 (four) times daily as needed. ), Disp: 100 g, Rfl: 3 .  felodipine (PLENDIL) 2.5 MG 24 hr tablet, TAKE 1 TABLET BY MOUTH DAILY., Disp: 90 tablet, Rfl: 3 .  fluticasone (CUTIVATE) 0.05 % cream, Apply topically 2 (two) times daily., Disp: 30 g, Rfl: 2 .  fosinopril (MONOPRIL) 40 MG tablet, TAKE 1 TABLET (40 MG TOTAL) BY MOUTH DAILY., Disp: 90 tablet, Rfl: 3 .  gabapentin (NEURONTIN) 300 MG capsule, Take 1 capsule (300 mg total) by mouth at bedtime., Disp: 90 capsule, Rfl: 1 .  glucose blood (ONE TOUCH ULTRA TEST) test strip, CHECK BLOOD SUGAR ONCE A DAY, Disp: 100 each, Rfl: 3 .  meclizine (ANTIVERT) 25 MG tablet, TAKE (1) TABLET THREE TIMES DAILY AS NEEDED for dizziness, Disp: 30 tablet, Rfl: 0 .  metFORMIN (GLUCOPHAGE) 1000 MG tablet, TAKE 1 TABLET in the am and 1/2 tablet by mouth every pm, Disp: 135 tablet, Rfl: 3 .  OneTouch Delica Lancets 99991111 MISC, CHECK BLOOD SUGAR ONCE DAILY OR AS DIRECTED Dx E11.42, Disp: 100 each, Rfl: 3 .  raloxifene (EVISTA) 60 MG tablet, Take 1 tablet (60 mg total) by mouth daily., Disp: 90 tablet, Rfl: 3 .  rosuvastatin (CRESTOR) 10 MG tablet, 1/2 tablet daily, Disp: 45 tablet, Rfl: 3 .  triamterene-hydrochlorothiazide (DYAZIDE) 37.5-25 MG capsule, TAKE 1 CAPSULE BY MOUTH EVERY DAY, Disp: 90 capsule, Rfl:  0 Social History   Socioeconomic History  . Marital status: Widowed    Spouse name: Not on file  . Number of children: Not on file  . Years of education: Not on file  . Highest education level: Not on file  Occupational History  . Not on file  Tobacco Use  . Smoking status: Never Smoker  . Smokeless tobacco: Never Used  Substance and Sexual Activity  . Alcohol use: No  . Drug use: No  . Sexual activity: Never  Other Topics Concern  . Not on file  Social History Narrative  . Not on file   Social Determinants of Health   Financial Resource Strain:   . Difficulty of Paying Living Expenses: Not on file  Food  Insecurity:   . Worried About Charity fundraiser in the Last Year: Not on file  . Ran Out of Food in the Last Year: Not on file  Transportation Needs:   . Lack of Transportation (Medical): Not on file  . Lack of Transportation (Non-Medical): Not on file  Physical Activity:   . Days of Exercise per Week: Not on file  . Minutes of Exercise per Session: Not on file  Stress:   . Feeling of Stress : Not on file  Social Connections:   . Frequency of Communication with Friends and Family: Not on file  . Frequency of Social Gatherings with Friends and Family: Not on file  . Attends Religious Services: Not on file  . Active Member of Clubs or Organizations: Not on file  . Attends Archivist Meetings: Not on file  . Marital Status: Not on file  Intimate Partner Violence:   . Fear of Current or Ex-Partner: Not on file  . Emotionally Abused: Not on file  . Physically Abused: Not on file  . Sexually Abused: Not on file   Family History  Problem Relation Age of Onset  . Heart disease Mother   . Hypertension Mother   . Cancer Father        lymp nodes  . Hypertension Brother     Objective: Office vital signs reviewed. BP 135/78   Pulse 86   Temp 98.6 F (37 C) (Temporal)   Ht 5' (1.524 m)   Wt 160 lb (72.6 kg)   SpO2 99%   BMI 31.25 kg/m   Physical Examination:  General: Awake, alert, well nourished, No acute distress HEENT: sclera white, MMM Cardio: regular rate and rhythm, S1S2 heard, no murmurs appreciated Pulm: clear to auscultation bilaterally, no wheezes, rhonchi or rales; normal work of breathing on room air Extremities: warm, well perfused w/ trace pedal edema. No cyanosis or clubbing; +2 pulses bilaterally Psych: Pleasant, interactive, talkative.  Assessment/ Plan: 86 y.o. female   1. Type 2 diabetes mellitus with diabetic polyneuropathy, without long-term current use of insulin (HCC) Under excellent control with A1c of 6.8 today.  Continue current regimen  - hgba1c  2. CKD stage 3 due to type 2 diabetes mellitus (HCC) Check BMP and uric acid level - Basic Metabolic Panel - Uric Acid  3. Diabetic polyneuropathy associated with type 2 diabetes mellitus (HCC) Stable - gabapentin (NEURONTIN) 300 MG capsule; Take 1 capsule (300 mg total) by mouth at bedtime.  Dispense: 90 capsule; Refill: 1  4. Essential hypertension Controlled.  No history of hyperkalemia in the past with concomitant use of triamterene and ACE inhibitor.  For now, will continue but low threshold to discontinue triamterene if develops hyperkalemia - triamterene-hydrochlorothiazide (DYAZIDE)  37.5-25 MG capsule; Take 1 each (1 capsule total) by mouth daily.  Dispense: 90 capsule; Refill: 0   Orders Placed This Encounter  Procedures  . hgba1c  . Basic Metabolic Panel  . Uric Acid   No orders of the defined types were placed in this encounter.    Janora Norlander, DO [place] [phone]

## 2019-03-25 ENCOUNTER — Ambulatory Visit: Payer: PPO | Admitting: Family Medicine

## 2019-03-25 LAB — BASIC METABOLIC PANEL
BUN/Creatinine Ratio: 14 (ref 12–28)
BUN: 14 mg/dL (ref 8–27)
CO2: 24 mmol/L (ref 20–29)
Calcium: 9.5 mg/dL (ref 8.7–10.3)
Chloride: 100 mmol/L (ref 96–106)
Creatinine, Ser: 0.97 mg/dL (ref 0.57–1.00)
GFR calc Af Amer: 60 mL/min/{1.73_m2} (ref >59)
GFR calc non Af Amer: 52 mL/min/{1.73_m2} — ABNORMAL LOW (ref >59)
Glucose: 142 mg/dL — ABNORMAL HIGH (ref 65–99)
Potassium: 4.4 mmol/L (ref 3.5–5.2)
Sodium: 141 mmol/L (ref 134–144)

## 2019-03-25 LAB — URIC ACID: Uric Acid: 5.7 mg/dL (ref 3.1–7.9)

## 2019-05-05 DIAGNOSIS — M79676 Pain in unspecified toe(s): Secondary | ICD-10-CM | POA: Diagnosis not present

## 2019-05-05 DIAGNOSIS — B351 Tinea unguium: Secondary | ICD-10-CM | POA: Diagnosis not present

## 2019-05-05 DIAGNOSIS — E1142 Type 2 diabetes mellitus with diabetic polyneuropathy: Secondary | ICD-10-CM | POA: Diagnosis not present

## 2019-05-05 DIAGNOSIS — L84 Corns and callosities: Secondary | ICD-10-CM | POA: Diagnosis not present

## 2019-05-19 DIAGNOSIS — E119 Type 2 diabetes mellitus without complications: Secondary | ICD-10-CM | POA: Diagnosis not present

## 2019-05-19 DIAGNOSIS — H40013 Open angle with borderline findings, low risk, bilateral: Secondary | ICD-10-CM | POA: Diagnosis not present

## 2019-05-19 DIAGNOSIS — Z961 Presence of intraocular lens: Secondary | ICD-10-CM | POA: Diagnosis not present

## 2019-05-19 LAB — HM DIABETES EYE EXAM

## 2019-05-27 ENCOUNTER — Telehealth: Payer: Self-pay | Admitting: Family Medicine

## 2019-05-27 NOTE — Chronic Care Management (AMB) (Signed)
  Chronic Care Management   Outreach Note  05/27/2019 Name: LILIYA DAVISSON MRN: [id number] DOB: 11/13/1928  PANSY BOSLER is a 86 y.o. year old female who is a primary care patient of Janora Norlander, DO. I reached out to [name] by phone today in response to a referral sent by Ms. Georgetta Haber [employer]ette's health plan.     An unsuccessful telephone outreach was attempted today. The patient was referred to the case management team for assistance with care management and care coordination.   Follow Up Plan: The care management team will reach out to the patient again over the next 7 days.  If patient returns call to provider office, please advise to call [facility] at [place], [place], [place], [place] [address] Direct Dial: [phone] [email or web address]: [email or web address]

## 2019-06-13 ENCOUNTER — Other Ambulatory Visit: Payer: Self-pay | Admitting: Family Medicine

## 2019-06-25 ENCOUNTER — Ambulatory Visit (HOSPITAL_COMMUNITY): Payer: PPO

## 2019-06-30 DIAGNOSIS — L03032 Cellulitis of left toe: Secondary | ICD-10-CM | POA: Diagnosis not present

## 2019-06-30 DIAGNOSIS — M79675 Pain in left toe(s): Secondary | ICD-10-CM | POA: Diagnosis not present

## 2019-07-04 ENCOUNTER — Other Ambulatory Visit: Payer: Self-pay | Admitting: Family Medicine

## 2019-07-13 ENCOUNTER — Ambulatory Visit: Payer: PPO | Admitting: Family Medicine

## 2019-07-20 ENCOUNTER — Ambulatory Visit (HOSPITAL_COMMUNITY): Payer: PPO

## 2019-08-04 DIAGNOSIS — B351 Tinea unguium: Secondary | ICD-10-CM | POA: Diagnosis not present

## 2019-08-04 DIAGNOSIS — E1142 Type 2 diabetes mellitus with diabetic polyneuropathy: Secondary | ICD-10-CM | POA: Diagnosis not present

## 2019-08-04 DIAGNOSIS — L84 Corns and callosities: Secondary | ICD-10-CM | POA: Diagnosis not present

## 2019-08-04 DIAGNOSIS — M79676 Pain in unspecified toe(s): Secondary | ICD-10-CM | POA: Diagnosis not present

## 2019-08-06 ENCOUNTER — Ambulatory Visit (HOSPITAL_COMMUNITY): Payer: PPO

## 2019-08-13 ENCOUNTER — Other Ambulatory Visit: Payer: Self-pay

## 2019-08-13 ENCOUNTER — Ambulatory Visit (HOSPITAL_COMMUNITY)
Admission: RE | Admit: 2019-08-13 | Discharge: 2019-08-13 | Disposition: A | Discharge status: Home / Self Care | Payer: PPO | Source: Ambulatory Visit | Attending: Family Medicine | Admitting: Family Medicine

## 2019-08-13 DIAGNOSIS — Z1231 Encounter for screening mammogram for malignant neoplasm of breast: Secondary | ICD-10-CM | POA: Diagnosis not present

## 2019-08-14 ENCOUNTER — Other Ambulatory Visit (HOSPITAL_COMMUNITY): Payer: Self-pay | Admitting: Family Medicine

## 2019-08-14 DIAGNOSIS — R928 Other abnormal and inconclusive findings on diagnostic imaging of breast: Secondary | ICD-10-CM

## 2019-09-01 ENCOUNTER — Ambulatory Visit (HOSPITAL_COMMUNITY)
Admission: RE | Admit: 2019-09-01 | Discharge: 2019-09-01 | Disposition: A | Discharge status: Home / Self Care | Payer: PPO | Source: Ambulatory Visit | Attending: Family Medicine | Admitting: Family Medicine

## 2019-09-01 ENCOUNTER — Other Ambulatory Visit: Payer: Self-pay

## 2019-09-01 DIAGNOSIS — R928 Other abnormal and inconclusive findings on diagnostic imaging of breast: Secondary | ICD-10-CM

## 2019-09-01 DIAGNOSIS — R922 Inconclusive mammogram: Secondary | ICD-10-CM | POA: Diagnosis not present

## 2019-09-05 ENCOUNTER — Other Ambulatory Visit: Payer: Self-pay | Admitting: Family Medicine

## 2019-09-05 DIAGNOSIS — I10 Essential (primary) hypertension: Secondary | ICD-10-CM

## 2019-09-07 NOTE — Telephone Encounter (Signed)
30 day supply sent in Has appt with Dr Darnell Level 09/18/19

## 2019-09-18 ENCOUNTER — Ambulatory Visit (INDEPENDENT_AMBULATORY_CARE_PROVIDER_SITE_OTHER): Payer: PPO | Admitting: Family Medicine

## 2019-09-18 ENCOUNTER — Other Ambulatory Visit: Payer: Self-pay

## 2019-09-18 ENCOUNTER — Encounter: Payer: Self-pay | Admitting: Family Medicine

## 2019-09-18 VITALS — BP 148/80 | HR 83 | Temp 97.4°F | Ht 60.0 in | Wt 157.0 lb

## 2019-09-18 DIAGNOSIS — M8580 Other specified disorders of bone density and structure, unspecified site: Secondary | ICD-10-CM

## 2019-09-18 DIAGNOSIS — E1142 Type 2 diabetes mellitus with diabetic polyneuropathy: Secondary | ICD-10-CM | POA: Diagnosis not present

## 2019-09-18 DIAGNOSIS — I10 Essential (primary) hypertension: Secondary | ICD-10-CM | POA: Diagnosis not present

## 2019-09-18 DIAGNOSIS — E1169 Type 2 diabetes mellitus with other specified complication: Secondary | ICD-10-CM | POA: Diagnosis not present

## 2019-09-18 DIAGNOSIS — E785 Hyperlipidemia, unspecified: Secondary | ICD-10-CM

## 2019-09-18 DIAGNOSIS — E1159 Type 2 diabetes mellitus with other circulatory complications: Secondary | ICD-10-CM

## 2019-09-18 DIAGNOSIS — E1122 Type 2 diabetes mellitus with diabetic chronic kidney disease: Secondary | ICD-10-CM

## 2019-09-18 DIAGNOSIS — I152 Hypertension secondary to endocrine disorders: Secondary | ICD-10-CM

## 2019-09-18 DIAGNOSIS — N183 Chronic kidney disease, stage 3 unspecified: Secondary | ICD-10-CM | POA: Diagnosis not present

## 2019-09-18 LAB — BAYER DCA HB A1C WAIVED: HB A1C (BAYER DCA - WAIVED): 6.8 % (ref <7.0)

## 2019-09-18 MED ORDER — FOSINOPRIL SODIUM 40 MG PO TABS: ORAL_TABLET | ORAL | 3 refills | Status: DC

## 2019-09-18 MED ORDER — ALLOPURINOL 100 MG PO TABS: 100.0000 mg | ORAL_TABLET | Freq: Every day | ORAL | 3 refills | Status: DC

## 2019-09-18 MED ORDER — METFORMIN HCL 1000 MG PO TABS: ORAL_TABLET | ORAL | 3 refills | Status: DC

## 2019-09-18 MED ORDER — ROSUVASTATIN CALCIUM 10 MG PO TABS: ORAL_TABLET | ORAL | 3 refills | Status: DC

## 2019-09-18 MED ORDER — FELODIPINE ER 2.5 MG PO TB24: ORAL_TABLET | ORAL | 3 refills | Status: DC

## 2019-09-18 MED ORDER — RALOXIFENE HCL 60 MG PO TABS: 60.0000 mg | ORAL_TABLET | Freq: Every day | ORAL | 3 refills | Status: DC

## 2019-09-18 MED ORDER — GABAPENTIN 300 MG PO CAPS: 300.0000 mg | ORAL_CAPSULE | Freq: Every day | ORAL | 1 refills | Status: DC

## 2019-09-18 NOTE — Patient Instructions (Addendum)
You had labs performed today.  You will be contacted with the results of the labs once they are available, usually in the next 3 business days for routine lab work.  If you have an active my chart account, they will be released to your MyChart.  If you prefer to have these labs released to you via telephone, please let us know.  If you had a pap smear or biopsy performed, expect to be contacted in about 7-10 days.  Get your diabetic eye exam done and have records sent to me

## 2019-09-18 NOTE — Progress Notes (Signed)
Subjective: CC: follow up DM2/ HTN, HLD PCP: Janora Norlander, DO Sara Holmes is a 86 y.o. female presenting to clinic today for:  1. Type 2 Diabetes with polyneuropathy, hyperlipidemia, hypertension and CKD Patient reports compliance with metformin 1000 mg every morning and 500 mg every afternoon, Maxzide and fosinopril for blood pressure, Crestor for cholesterol, gabapentin for polyneuropathy.   No problems with medications.  No known refills needed.  She admits to not really enjoying protein in her diet and therefore typically focuses on other sources of nutrition.  She does admit to enjoying her sweets but tries to limit these.  Last eye exam: Sees Dr. Katy Fitch regularly.  She just saw him in March  last foot exam: UTD Last A1c:  Lab Results  Component Value Date   HGBA1C 6.8 03/24/2019  Nephropathy screen indicated?: On ACE-I Last flu, zoster and/or pneumovax: UTD  ROS: No chest pain, shortness of breath, falls.  She has chronic polyneuropathy in the lower extremities that is stable with gabapentin.  She does ask for handicap placard as her joint pain and neuropathy does cause difficulty with ambulating for long distances.   ROS: Per HPI  Allergies  Allergen Reactions  . Prednisone Other (See Comments)    Elevated blood sugar  . Amoxicillin Itching and Rash  . Tetanus Toxoids Rash   Past Medical History:  Diagnosis Date  . Diabetes mellitus without complication ([place])   . Diverticulosis   . Glaucoma   . Gout   . Hyperlipidemia   . Hypertension   . Osteopenia   . Plantar fasciitis   . Sinus bradycardia   . Vertigo     Current Outpatient Medications:  .  allopurinol (ZYLOPRIM) 100 MG tablet, TAKE 1 TABLET DAILY, Disp: 90 tablet, Rfl: 0 .  aspirin 81 MG tablet, Take 81 mg by mouth daily., Disp: , Rfl:  .  Calcium Carbonate-Vitamin D (CALTRATE 600+D PO), Take 1 tablet by mouth 2 (two) times daily. , Disp: , Rfl:  .  Cholecalciferol (VITAMIN D3) 1000 UNITS  CAPS, Take 1 tablet by mouth daily.  , Disp: , Rfl:  .  diclofenac sodium (VOLTAREN) 1 % GEL, Apply 4 g topically 4 (four) times daily. (Patient taking differently: Apply 4 g topically 4 (four) times daily as needed. ), Disp: 100 g, Rfl: 3 .  felodipine (PLENDIL) 2.5 MG 24 hr tablet, TAKE 1 TABLET BY MOUTH DAILY., Disp: 90 tablet, Rfl: 3 .  fluticasone (CUTIVATE) 0.05 % cream, Apply topically 2 (two) times daily., Disp: 30 g, Rfl: 2 .  fosinopril (MONOPRIL) 40 MG tablet, TAKE 1 TABLET (40 MG TOTAL) BY MOUTH DAILY., Disp: 90 tablet, Rfl: 3 .  gabapentin (NEURONTIN) 300 MG capsule, Take 1 capsule (300 mg total) by mouth at bedtime., Disp: 90 capsule, Rfl: 1 .  glucose blood (ONETOUCH ULTRA) test strip, Test BS daily Dx E11.42, Disp: 100 strip, Rfl: 3 .  meclizine (ANTIVERT) 25 MG tablet, TAKE (1) TABLET THREE TIMES DAILY AS NEEDED for dizziness, Disp: 30 tablet, Rfl: 0 .  metFORMIN (GLUCOPHAGE) 1000 MG tablet, TAKE 1 TABLET in the am and 1/2 tablet by mouth every pm, Disp: 135 tablet, Rfl: 3 .  OneTouch Delica Lancets 54O MISC, CHECK BLOOD SUGAR ONCE DAILY OR AS DIRECTED Dx E11.42, Disp: 100 each, Rfl: 3 .  raloxifene (EVISTA) 60 MG tablet, Take 1 tablet (60 mg total) by mouth daily., Disp: 90 tablet, Rfl: 3 .  rosuvastatin (CRESTOR) 10 MG tablet, 1/2 tablet daily,  Disp: 45 tablet, Rfl: 3 .  triamterene-hydrochlorothiazide (DYAZIDE) 37.5-25 MG capsule, TAKE (1) CAPSULE DAILY, Disp: 30 capsule, Rfl: 0 Social History   Socioeconomic History  . Marital status: Widowed    Spouse name: Not on file  . Number of children: Not on file  . Years of education: Not on file  . Highest education level: Not on file  Occupational History  . Not on file  Tobacco Use  . Smoking status: Never Smoker  . Smokeless tobacco: Never Used  Vaping Use  . Vaping Use: Never used  Substance and Sexual Activity  . Alcohol use: No  . Drug use: No  . Sexual activity: Never  Other Topics Concern  . Not on file  Social  History Narrative  . Not on file   Social Determinants of Health   Financial Resource Strain:   . Difficulty of Paying Living Expenses:   Food Insecurity:   . Worried About Charity fundraiser in the Last Year:   . Arboriculturist in the Last Year:   Transportation Needs:   . Film/video editor (Medical):   Marland Kitchen Lack of Transportation (Non-Medical):   Physical Activity:   . Days of Exercise per Week:   . Minutes of Exercise per Session:   Stress:   . Feeling of Stress :   Social Connections:   . Frequency of Communication with Friends and Family:   . Frequency of Social Gatherings with Friends and Family:   . Attends Religious Services:   . Active Member of Clubs or Organizations:   . Attends Archivist Meetings:   Marland Kitchen Marital Status:   Intimate Partner Violence:   . Fear of Current or Ex-Partner:   . Emotionally Abused:   Marland Kitchen Physically Abused:   . Sexually Abused:    Family History  Problem Relation Age of Onset  . Heart disease Mother   . Hypertension Mother   . Cancer Father        lymp nodes  . Hypertension Brother     Objective: Office vital signs reviewed. BP (!) 148/80   Pulse 83   Temp (!) 97.4 F (36.3 C)   Ht 5' (1.524 m)   Wt 157 lb (71.2 kg)   SpO2 99%   BMI 30.66 kg/m   Physical Examination:  General: Awake, alert, well nourished, No acute distress HEENT: sclera white, MMM Cardio: regular rate and rhythm, S1S2 heard, no murmurs appreciated Pulm: clear to auscultation bilaterally, no wheezes, rhonchi or rales; normal work of breathing on room air Extremities: warm, well perfused w/ trace nonpitting ankle edema. No cyanosis or clubbing; +2 pulses bilaterally MSK: Ambulating independently.  Normal tone. Psych: Pleasant, interactive, talkative. Depression screen [facility] 2/9 09/18/2019 03/24/2019 09/22/2018  Decreased Interest 0 0 0  Down, Depressed, Hopeless 0 0 0  PHQ - 2 Score 0 0 0  Altered sleeping - 0 0  Tired, decreased energy - 0 0    Change in appetite - 0 0  Feeling bad or failure about yourself  - 0 0  Trouble concentrating - 0 0  Moving slowly or fidgety/restless - 0 0  Suicidal thoughts - 0 0  PHQ-9 Score - 0 0  Difficult doing work/chores - - -  Some recent data might be hidden     Assessment/ Plan: 86 y.o. female   1. Type 2 diabetes mellitus with diabetic polyneuropathy, without long-term current use of insulin (HCC) Controlled.  Continue current regimen.  Refill  sent - Lipid Panel - Bayer DCA Hb A1c Waived - metFORMIN (GLUCOPHAGE) 1000 MG tablet; TAKE 1 TABLET in the am and 1/2 tablet by mouth every pm  Dispense: 135 tablet; Refill: 3  2. CKD stage 3 due to type 2 diabetes mellitus ([place]) Recheck renal function.  Has been on concomitant triamterene and Monopril for some time now.  Potassium has been stable. - CMP14+EGFR - fosinopril (MONOPRIL) 40 MG tablet; TAKE 1 TABLET (40 MG TOTAL) BY MOUTH DAILY.  Dispense: 90 tablet; Refill: 3  3. Hyperlipidemia associated with type 2 diabetes mellitus (HCC) Continue statin - Lipid Panel - CMP14+EGFR - rosuvastatin (CRESTOR) 10 MG tablet; 1/2 tablet daily  Dispense: 45 tablet; Refill: 3  4. Hypertension associated with diabetes ([place]) Controlled for age - CMP14+EGFR  32. Osteopenia, unspecified location Check vitamin D - VITAMIN D 25 Hydroxy (Vit-D Deficiency, Fractures) - raloxifene (EVISTA) 60 MG tablet; Take 1 tablet (60 mg total) by mouth daily.  Dispense: 90 tablet; Refill: 3  6. Diabetic polyneuropathy associated with type 2 diabetes mellitus (HCC) Stable.  Handicap placard form provided - gabapentin (NEURONTIN) 300 MG capsule; Take 1 capsule (300 mg total) by mouth at bedtime.  Dispense: 90 capsule; Refill: 1    No orders of the defined types were placed in this encounter.  No orders of the defined types were placed in this encounter.    Janora Norlander, DO [place] [phone]

## 2019-09-19 LAB — CMP14+EGFR
ALT: 13 IU/L (ref 0–32)
AST: 20 IU/L (ref 0–40)
Albumin/Globulin Ratio: 2.3 — ABNORMAL HIGH (ref 1.2–2.2)
Albumin: 4.1 g/dL (ref 3.6–4.6)
Alkaline Phosphatase: 49 IU/L (ref 48–121)
BUN/Creatinine Ratio: 12 (ref 12–28)
BUN: 13 mg/dL (ref 8–27)
Bilirubin Total: 0.7 mg/dL (ref 0.0–1.2)
CO2: 23 mmol/L (ref 20–29)
Calcium: 9.1 mg/dL (ref 8.7–10.3)
Chloride: 98 mmol/L (ref 96–106)
Creatinine, Ser: 1.12 mg/dL — ABNORMAL HIGH (ref 0.57–1.00)
GFR calc Af Amer: 51 mL/min/{1.73_m2} — ABNORMAL LOW (ref >59)
GFR calc non Af Amer: 44 mL/min/{1.73_m2} — ABNORMAL LOW (ref >59)
Globulin, Total: 1.8 g/dL (ref 1.5–4.5)
Glucose: 130 mg/dL — ABNORMAL HIGH (ref 65–99)
Potassium: 4 mmol/L (ref 3.5–5.2)
Sodium: 137 mmol/L (ref 134–144)
Total Protein: 5.9 g/dL — ABNORMAL LOW (ref 6.0–8.5)

## 2019-09-19 LAB — LIPID PANEL
Chol/HDL Ratio: 2.1 ratio (ref 0.0–4.4)
Cholesterol, Total: 124 mg/dL (ref 100–199)
HDL: 60 mg/dL (ref >39)
LDL Chol Calc (NIH): 46 mg/dL (ref 0–99)
Triglycerides: 100 mg/dL (ref 0–149)
VLDL Cholesterol Cal: 18 mg/dL (ref 5–40)

## 2019-09-19 LAB — VITAMIN D 25 HYDROXY (VIT D DEFICIENCY, FRACTURES): Vit D, 25-Hydroxy: 41.4 ng/mL (ref 30.0–100.0)

## 2019-11-03 DIAGNOSIS — L84 Corns and callosities: Secondary | ICD-10-CM | POA: Diagnosis not present

## 2019-11-03 DIAGNOSIS — B351 Tinea unguium: Secondary | ICD-10-CM | POA: Diagnosis not present

## 2019-11-03 DIAGNOSIS — M79676 Pain in unspecified toe(s): Secondary | ICD-10-CM | POA: Diagnosis not present

## 2019-11-03 DIAGNOSIS — E1142 Type 2 diabetes mellitus with diabetic polyneuropathy: Secondary | ICD-10-CM | POA: Diagnosis not present

## 2019-11-10 ENCOUNTER — Other Ambulatory Visit: Payer: Self-pay | Admitting: Family Medicine

## 2019-12-03 DIAGNOSIS — I872 Venous insufficiency (chronic) (peripheral): Secondary | ICD-10-CM | POA: Diagnosis not present

## 2019-12-05 ENCOUNTER — Other Ambulatory Visit: Payer: Self-pay | Admitting: Family Medicine

## 2019-12-05 DIAGNOSIS — I10 Essential (primary) hypertension: Secondary | ICD-10-CM

## 2020-01-04 ENCOUNTER — Other Ambulatory Visit: Payer: Self-pay | Admitting: Family Medicine

## 2020-01-04 DIAGNOSIS — E1169 Type 2 diabetes mellitus with other specified complication: Secondary | ICD-10-CM

## 2020-01-13 DIAGNOSIS — H00024 Hordeolum internum left upper eyelid: Secondary | ICD-10-CM | POA: Diagnosis not present

## 2020-01-28 IMAGING — MG DIGITAL SCREENING BILATERAL MAMMOGRAM WITH TOMO AND CAD
8 series · 8 of 24 positions shown · non-contrast
Comparison: Previous exam(s).

CLINICAL DATA: Screening.

EXAM:
DIGITAL SCREENING BILATERAL MAMMOGRAM WITH TOMO AND CAD

[L CC synth-2D]
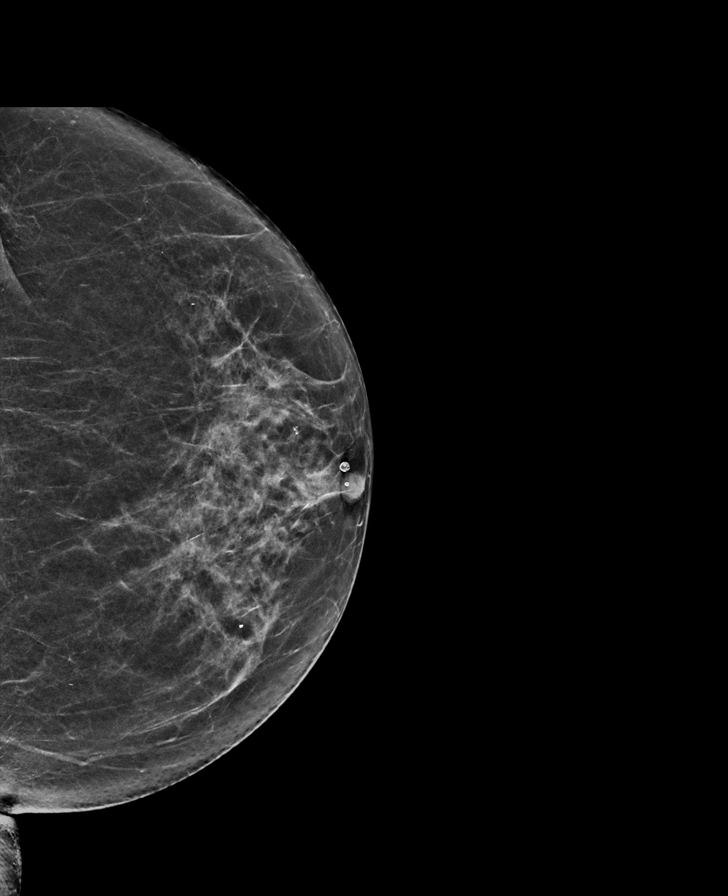

[L MLO synth-2D]
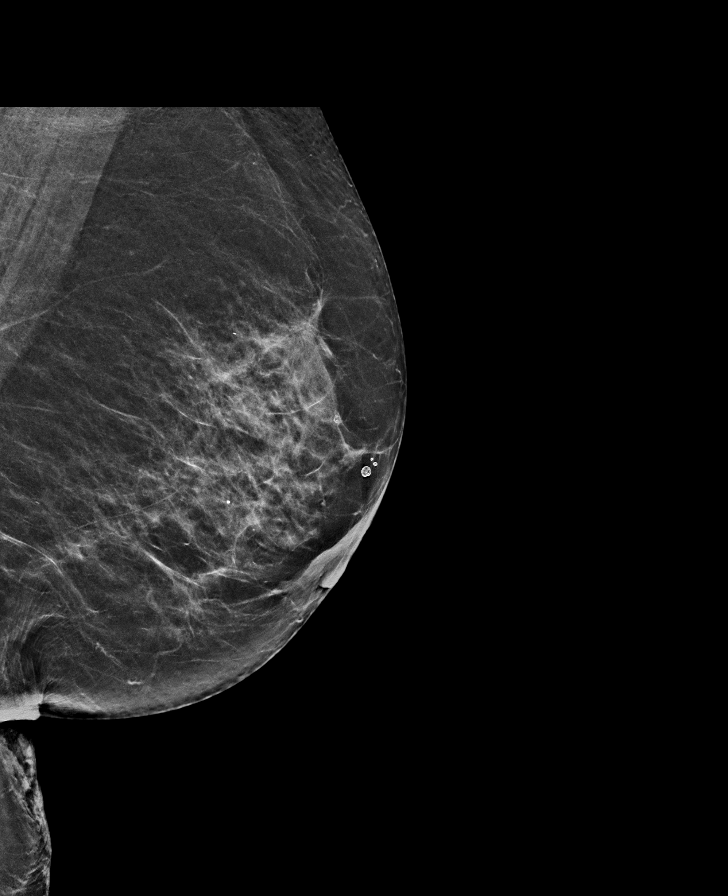

[R MLO synth-2D]
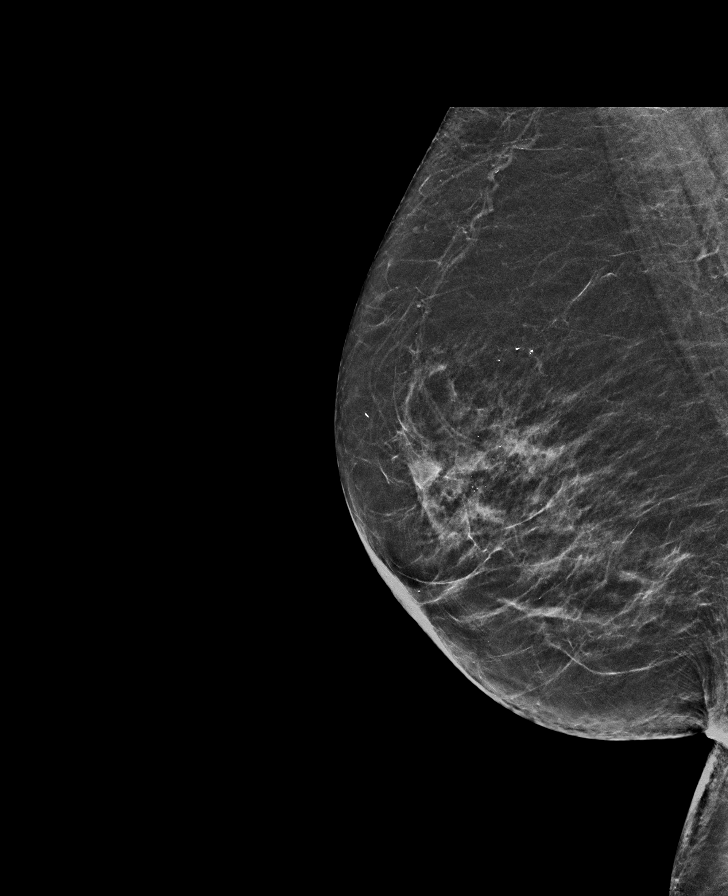

[R CC synth-2D]
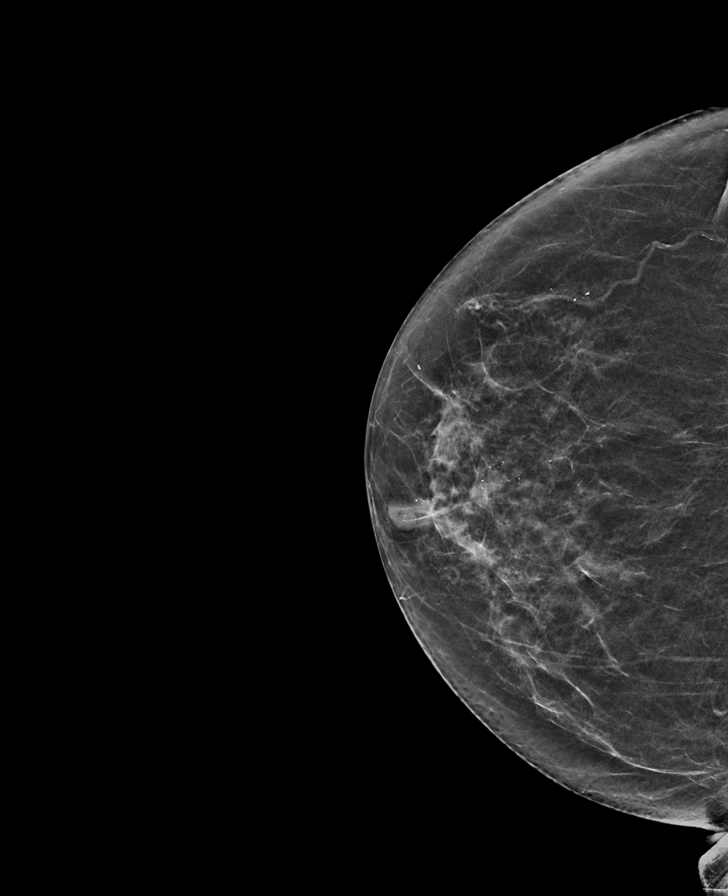

[R MLO tomo · tomo slice 31/60.0]
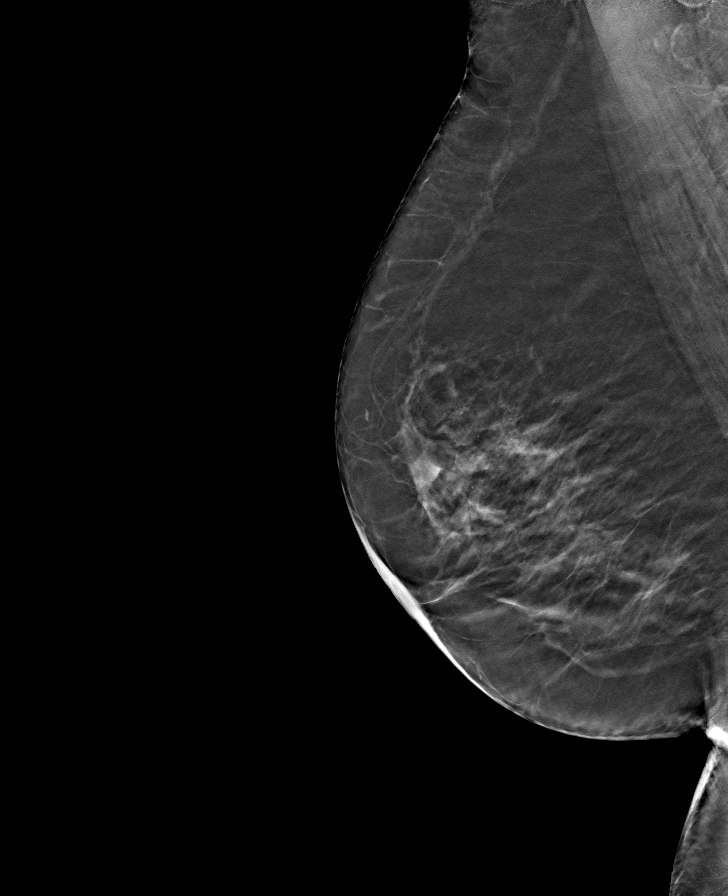

[R CC tomo · tomo slice 29/56.0]
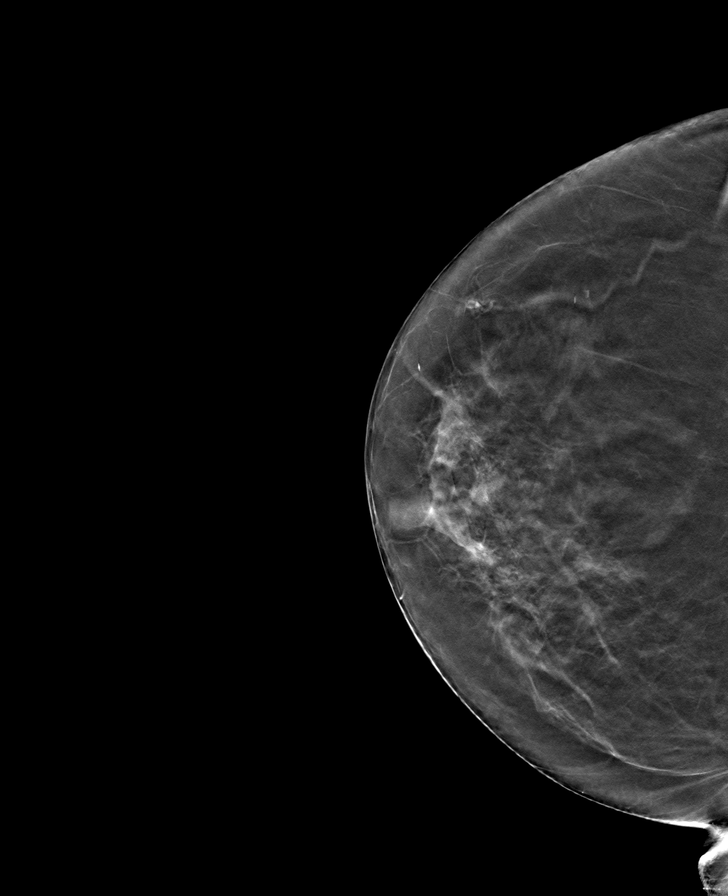

[L MLO tomo · tomo slice 31/62.0]
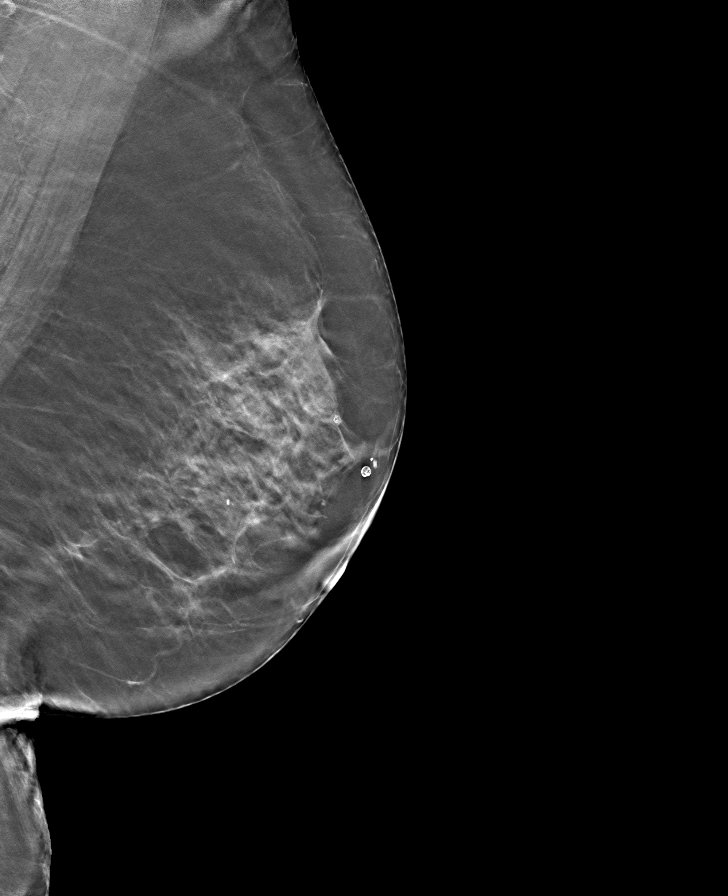

[L CC tomo · tomo slice 29/58.0]
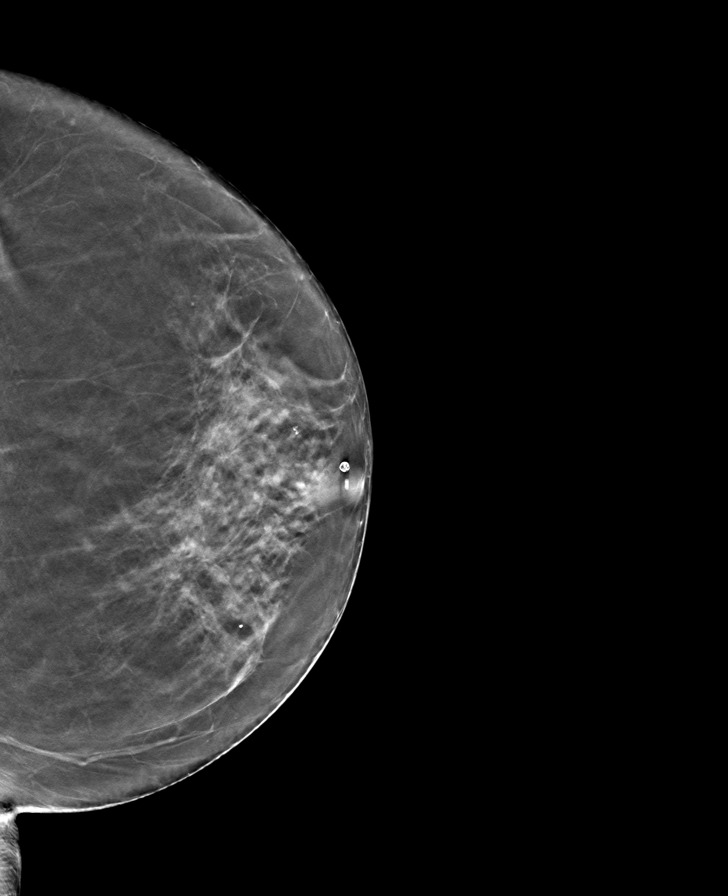

[8 of 24 positions shown; findings below may reference images not displayed]

ACR Breast Density Category b: There are scattered areas of
fibroglandular density.
FINDINGS: There are no findings suspicious for malignancy. Images were
processed with CAD.
IMPRESSION: No mammographic evidence of malignancy. A result letter of this
screening mammogram will be mailed directly to the patient.

RECOMMENDATION:
Screening mammogram in one year. (Code:[id number])

BI-RADS CATEGORY  1: Negative.

## 2020-02-09 DIAGNOSIS — L84 Corns and callosities: Secondary | ICD-10-CM | POA: Diagnosis not present

## 2020-02-09 DIAGNOSIS — M79676 Pain in unspecified toe(s): Secondary | ICD-10-CM | POA: Diagnosis not present

## 2020-02-09 DIAGNOSIS — E1142 Type 2 diabetes mellitus with diabetic polyneuropathy: Secondary | ICD-10-CM | POA: Diagnosis not present

## 2020-02-09 DIAGNOSIS — B351 Tinea unguium: Secondary | ICD-10-CM | POA: Diagnosis not present

## 2020-03-07 ENCOUNTER — Other Ambulatory Visit: Payer: Self-pay | Admitting: Family Medicine

## 2020-03-07 DIAGNOSIS — I10 Essential (primary) hypertension: Secondary | ICD-10-CM

## 2020-03-21 ENCOUNTER — Ambulatory Visit (INDEPENDENT_AMBULATORY_CARE_PROVIDER_SITE_OTHER): Payer: PPO | Admitting: Family Medicine

## 2020-03-21 ENCOUNTER — Encounter: Payer: Self-pay | Admitting: Family Medicine

## 2020-03-21 ENCOUNTER — Other Ambulatory Visit: Payer: Self-pay

## 2020-03-21 VITALS — BP 130/80 | HR 84 | Temp 97.1°F | Resp 20 | Ht 60.0 in | Wt 155.0 lb

## 2020-03-21 DIAGNOSIS — I152 Hypertension secondary to endocrine disorders: Secondary | ICD-10-CM | POA: Diagnosis not present

## 2020-03-21 DIAGNOSIS — E1122 Type 2 diabetes mellitus with diabetic chronic kidney disease: Secondary | ICD-10-CM | POA: Diagnosis not present

## 2020-03-21 DIAGNOSIS — E1142 Type 2 diabetes mellitus with diabetic polyneuropathy: Secondary | ICD-10-CM | POA: Diagnosis not present

## 2020-03-21 DIAGNOSIS — N183 Chronic kidney disease, stage 3 unspecified: Secondary | ICD-10-CM | POA: Diagnosis not present

## 2020-03-21 DIAGNOSIS — E1169 Type 2 diabetes mellitus with other specified complication: Secondary | ICD-10-CM | POA: Diagnosis not present

## 2020-03-21 DIAGNOSIS — E785 Hyperlipidemia, unspecified: Secondary | ICD-10-CM | POA: Diagnosis not present

## 2020-03-21 DIAGNOSIS — E1159 Type 2 diabetes mellitus with other circulatory complications: Secondary | ICD-10-CM

## 2020-03-21 LAB — BAYER DCA HB A1C WAIVED: HB A1C (BAYER DCA - WAIVED): 7 % — ABNORMAL HIGH (ref <7.0)

## 2020-03-21 NOTE — Progress Notes (Signed)
Subjective: CC: DM, HTN PCP: Janora Norlander, DO OIN:OMVEH LATHA STAUNTON is a 87 y.o. female presenting to clinic today for:  1. Type 2 Diabetes with hypertension, CKD3a, hyperlipidemia and DM neuropathy:  Patient is compliant with her medications.  Her average fasting blood sugars have been in the 120s with occasional highs into the 130s.  She did indulge in the holidays but overall tries to maintain a fairly restricted diet with the exception of banana sandwiches, which she loves.  Denies any hypoglycemic episodes  Last eye exam: Up-to-date Last foot exam: Needs.  Sees Dr. Irving Shows as well Last A1c:  Lab Results  Component Value Date   HGBA1C 6.8 09/18/2019   Nephropathy screen indicated?:  On ACE inhibitor but also had urine done with [facility] nurse recently. Last flu, zoster and/or pneumovax:  Immunization History  Administered Date(s) Administered  . Fluad Quad(high Dose 65+) 12/14/2019  . Influenza Whole 12/28/2008  . Influenza, High Dose Seasonal PF 12/11/2016, 12/20/2017  . Influenza,inj,Quad PF,6+ Mos 12/03/2012, 01/01/2014, 12/31/2014, 11/28/2015  . Influenza,inj,quad, With Preservative 12/03/2018  . Influenza-Unspecified 12/04/2018  . Moderna Sars-Covid-2 Vaccination 05/11/2019, 06/08/2019, 01/26/2020  . Pneumococcal Polysaccharide-23 03/31/2010  . Zoster 01/28/2007  . Zoster Recombinat (Shingrix) 11/07/2016, 04/01/2017    ROS: Denies chest pain, shortness of breath, dizziness.  Neuropathy is well controlled with gabapentin.    ROS: Per HPI  Allergies  Allergen Reactions  . Prednisone Other (See Comments)    Elevated blood sugar  . Amoxicillin Itching and Rash  . Tetanus Toxoids Rash   Past Medical History:  Diagnosis Date  . Diabetes mellitus without complication ([place])   . Diverticulosis   . Glaucoma   . Gout   . Hyperlipidemia   . Hypertension   . Osteopenia   . Plantar fasciitis   . Sinus bradycardia   . Vertigo     Current Outpatient Medications:   .  allopurinol (ZYLOPRIM) 100 MG tablet, Take 1 tablet (100 mg total) by mouth daily., Disp: 90 tablet, Rfl: 3 .  aspirin 81 MG tablet, Take 81 mg by mouth daily., Disp: , Rfl:  .  Calcium Carbonate-Vitamin D (CALTRATE 600+D PO), Take 1 tablet by mouth 2 (two) times daily. , Disp: , Rfl:  .  Cholecalciferol (VITAMIN D3) 1000 UNITS CAPS, Take 1 tablet by mouth daily.  , Disp: , Rfl:  .  diclofenac sodium (VOLTAREN) 1 % GEL, Apply 4 g topically 4 (four) times daily. (Patient taking differently: Apply 4 g topically 4 (four) times daily as needed. ), Disp: 100 g, Rfl: 3 .  felodipine (PLENDIL) 2.5 MG 24 hr tablet, TAKE 1 TABLET BY MOUTH DAILY., Disp: 90 tablet, Rfl: 3 .  fluticasone (CUTIVATE) 0.05 % cream, Apply topically 2 (two) times daily., Disp: 30 g, Rfl: 2 .  fosinopril (MONOPRIL) 40 MG tablet, TAKE 1 TABLET (40 MG TOTAL) BY MOUTH DAILY., Disp: 90 tablet, Rfl: 3 .  gabapentin (NEURONTIN) 300 MG capsule, Take 1 capsule (300 mg total) by mouth at bedtime., Disp: 90 capsule, Rfl: 1 .  glucose blood (ONETOUCH ULTRA) test strip, Test BS daily Dx E11.42, Disp: 100 strip, Rfl: 3 .  Lancets (ONETOUCH DELICA PLUS MCNOBS96G) MISC, CHECK BLOOD SUGAR ONCE DAILY OR AS DIRECTED Dx E11.42, Disp: 100 each, Rfl: 3 .  meclizine (ANTIVERT) 25 MG tablet, TAKE (1) TABLET THREE TIMES DAILY AS NEEDED for dizziness, Disp: 30 tablet, Rfl: 0 .  metFORMIN (GLUCOPHAGE) 1000 MG tablet, TAKE 1 TABLET in the am and 1/2 tablet  by mouth every pm, Disp: 135 tablet, Rfl: 3 .  raloxifene (EVISTA) 60 MG tablet, Take 1 tablet (60 mg total) by mouth daily., Disp: 90 tablet, Rfl: 3 .  rosuvastatin (CRESTOR) 10 MG tablet, TAKE (1/2) TABLET DAILY., Disp: 45 tablet, Rfl: 0 .  triamterene-hydrochlorothiazide (DYAZIDE) 37.5-25 MG capsule, TAKE (1) CAPSULE DAILY, Disp: 90 capsule, Rfl: 0 Social History   Socioeconomic History  . Marital status: Widowed    Spouse name: Not on file  . Number of children: Not on file  . Years of  education: Not on file  . Highest education level: Not on file  Occupational History  . Not on file  Tobacco Use  . Smoking status: Never Smoker  . Smokeless tobacco: Never Used  Vaping Use  . Vaping Use: Never used  Substance and Sexual Activity  . Alcohol use: No  . Drug use: No  . Sexual activity: Never  Other Topics Concern  . Not on file  Social History Narrative  . Not on file   Social Determinants of Health   Financial Resource Strain: Not on file  Food Insecurity: Not on file  Transportation Needs: Not on file  Physical Activity: Not on file  Stress: Not on file  Social Connections: Not on file  Intimate Partner Violence: Not on file   Family History  Problem Relation Age of Onset  . Heart disease Mother   . Hypertension Mother   . Cancer Father        lymp nodes  . Hypertension Brother     Objective: Office vital signs reviewed. BP 130/80   Pulse 84   Temp (!) 97.1 F (36.2 C)   Resp 20   Ht 5' (1.524 m)   Wt 155 lb (70.3 kg)   SpO2 99%   BMI 30.27 kg/m   Physical Examination:  General: Awake, alert, well nourished, No acute distress HEENT: Normal; sclera white.  Moist mucous membranes.  Scant dried skin noted in the right external auditory canal Cardio: regular rate and rhythm, S1S2 heard, no murmurs appreciated Pulm: clear to auscultation bilaterally, no wheezes, rhonchi or rales; normal work of breathing on room air Extremities: warm, well perfused, 1+ pitting edema to mid shins bilaterally, no cyanosis or clubbing; +2 pulses bilaterally MSK: normal gait and station Neuro: see DM foot  Diabetic Foot Exam - Simple   Simple Foot Form Diabetic Foot exam was performed with the following findings: Yes 03/21/2020  9:52 AM  Visual Inspection See comments: Yes Sensation Testing Intact to touch and monofilament testing bilaterally: Yes Pulse Check Posterior Tibialis and Dorsalis pulse intact bilaterally: Yes Comments +2 pedal pulses.   Monofilament sensation intact.  She does have a nodule noted on the ventral aspect of the third digit on the right.  She has onychomycotic changes to the great toenails bilaterally with associated discoloration of the right great toenail.  Followed by Dr. Irving Shows     Assessment/ Plan: 87 y.o. female   Type 2 diabetes mellitus with diabetic polyneuropathy, without long-term current use of insulin ([place]) - Plan: Bayer DCA Hb A1c Waived  CKD stage 3 due to type 2 diabetes mellitus ([place]) - Plan: Renal Function Panel, CBC  Hyperlipidemia associated with type 2 diabetes mellitus ([place])  Hypertension associated with diabetes ([place])  Sugars well controlled for age.  A1c was 7.0 today.  Continue current regimen  Check renal function panel  Blood pressure is well controlled.  Continue current regimen.  Continue statin   Orders  Placed This Encounter  Procedures  . Bayer DCA Hb A1c Waived  . Renal Function Panel  . CBC   No orders of the defined types were placed in this encounter.    Janora Norlander, DO [place] [phone]

## 2020-03-21 NOTE — Patient Instructions (Signed)
A1c 7.0 today.  Slight rise but no changes to meds needed.  Bring me a copy of your medicare nurse home visit sheet when you have a chance.  You had labs performed today.  You will be contacted with the results of the labs once they are available, usually in the next 3 business days for routine lab work.  If you have an active my chart account, they will be released to your MyChart.  If you prefer to have these labs released to you via telephone, please let us know.  If you had a pap smear or biopsy performed, expect to be contacted in about 7-10 days.

## 2020-03-22 LAB — CBC
Hematocrit: 33.4 % — ABNORMAL LOW (ref 34.0–46.6)
Hemoglobin: 11.4 g/dL (ref 11.1–15.9)
MCH: 32 pg (ref 26.6–33.0)
MCHC: 34.1 g/dL (ref 31.5–35.7)
MCV: 94 fL (ref 79–97)
Platelets: 218 10*3/uL (ref 150–450)
RBC: 3.56 x10E6/uL — ABNORMAL LOW (ref 3.77–5.28)
RDW: 12.8 % (ref 11.7–15.4)
WBC: 6.3 10*3/uL (ref 3.4–10.8)

## 2020-03-22 LAB — RENAL FUNCTION PANEL
Albumin: 4 g/dL (ref 3.6–4.6)
BUN/Creatinine Ratio: 13 (ref 12–28)
BUN: 12 mg/dL (ref 8–27)
CO2: 26 mmol/L (ref 20–29)
Calcium: 9.2 mg/dL (ref 8.7–10.3)
Chloride: 99 mmol/L (ref 96–106)
Creatinine, Ser: 0.96 mg/dL (ref 0.57–1.00)
GFR calc Af Amer: 61 mL/min/{1.73_m2} (ref >59)
GFR calc non Af Amer: 53 mL/min/{1.73_m2} — ABNORMAL LOW (ref >59)
Glucose: 140 mg/dL — ABNORMAL HIGH (ref 65–99)
Phosphorus: 3.2 mg/dL (ref 3.0–4.3)
Potassium: 3.8 mmol/L (ref 3.5–5.2)
Sodium: 140 mmol/L (ref 134–144)

## 2020-05-10 DIAGNOSIS — B351 Tinea unguium: Secondary | ICD-10-CM | POA: Diagnosis not present

## 2020-05-10 DIAGNOSIS — E1142 Type 2 diabetes mellitus with diabetic polyneuropathy: Secondary | ICD-10-CM | POA: Diagnosis not present

## 2020-05-10 DIAGNOSIS — L84 Corns and callosities: Secondary | ICD-10-CM | POA: Diagnosis not present

## 2020-05-10 DIAGNOSIS — M79676 Pain in unspecified toe(s): Secondary | ICD-10-CM | POA: Diagnosis not present

## 2020-05-19 DIAGNOSIS — H40013 Open angle with borderline findings, low risk, bilateral: Secondary | ICD-10-CM | POA: Diagnosis not present

## 2020-05-19 DIAGNOSIS — E119 Type 2 diabetes mellitus without complications: Secondary | ICD-10-CM | POA: Diagnosis not present

## 2020-05-19 DIAGNOSIS — Z961 Presence of intraocular lens: Secondary | ICD-10-CM | POA: Diagnosis not present

## 2020-05-19 LAB — HM DIABETES EYE EXAM

## 2020-06-04 ENCOUNTER — Other Ambulatory Visit: Payer: Self-pay | Admitting: Family Medicine

## 2020-06-04 DIAGNOSIS — I10 Essential (primary) hypertension: Secondary | ICD-10-CM

## 2020-07-21 ENCOUNTER — Other Ambulatory Visit (HOSPITAL_COMMUNITY): Payer: Self-pay | Admitting: Family Medicine

## 2020-07-21 DIAGNOSIS — Z1231 Encounter for screening mammogram for malignant neoplasm of breast: Secondary | ICD-10-CM

## 2020-08-01 ENCOUNTER — Other Ambulatory Visit: Payer: Self-pay | Admitting: Family Medicine

## 2020-08-09 DIAGNOSIS — E1142 Type 2 diabetes mellitus with diabetic polyneuropathy: Secondary | ICD-10-CM | POA: Diagnosis not present

## 2020-08-09 DIAGNOSIS — B351 Tinea unguium: Secondary | ICD-10-CM | POA: Diagnosis not present

## 2020-08-09 DIAGNOSIS — M79676 Pain in unspecified toe(s): Secondary | ICD-10-CM | POA: Diagnosis not present

## 2020-08-09 DIAGNOSIS — L84 Corns and callosities: Secondary | ICD-10-CM | POA: Diagnosis not present

## 2020-08-13 ENCOUNTER — Other Ambulatory Visit: Payer: Self-pay | Admitting: Family Medicine

## 2020-08-16 ENCOUNTER — Encounter: Payer: Self-pay | Admitting: *Deleted

## 2020-08-18 ENCOUNTER — Ambulatory Visit (HOSPITAL_COMMUNITY)
Admission: RE | Admit: 2020-08-18 | Discharge: 2020-08-18 | Disposition: A | Discharge status: Home / Self Care | Payer: PPO | Source: Ambulatory Visit | Attending: Family Medicine | Admitting: Family Medicine

## 2020-08-18 ENCOUNTER — Other Ambulatory Visit: Payer: Self-pay

## 2020-08-18 DIAGNOSIS — Z1231 Encounter for screening mammogram for malignant neoplasm of breast: Secondary | ICD-10-CM | POA: Diagnosis not present

## 2020-09-03 ENCOUNTER — Other Ambulatory Visit: Payer: Self-pay | Admitting: Family Medicine

## 2020-09-03 DIAGNOSIS — I10 Essential (primary) hypertension: Secondary | ICD-10-CM

## 2020-09-05 ENCOUNTER — Other Ambulatory Visit: Payer: Self-pay | Admitting: Family Medicine

## 2020-09-05 DIAGNOSIS — E1142 Type 2 diabetes mellitus with diabetic polyneuropathy: Secondary | ICD-10-CM

## 2020-09-09 ENCOUNTER — Ambulatory Visit (INDEPENDENT_AMBULATORY_CARE_PROVIDER_SITE_OTHER): Payer: PPO | Admitting: Family Medicine

## 2020-09-09 ENCOUNTER — Other Ambulatory Visit: Payer: Self-pay

## 2020-09-09 ENCOUNTER — Encounter: Payer: Self-pay | Admitting: Family Medicine

## 2020-09-09 VITALS — BP 138/88 | HR 88 | Temp 98.7°F | Ht 60.0 in | Wt 151.6 lb

## 2020-09-09 DIAGNOSIS — E1159 Type 2 diabetes mellitus with other circulatory complications: Secondary | ICD-10-CM | POA: Diagnosis not present

## 2020-09-09 DIAGNOSIS — N1831 Chronic kidney disease, stage 3a: Secondary | ICD-10-CM | POA: Diagnosis not present

## 2020-09-09 DIAGNOSIS — Z0000 Encounter for general adult medical examination without abnormal findings: Secondary | ICD-10-CM

## 2020-09-09 DIAGNOSIS — M8580 Other specified disorders of bone density and structure, unspecified site: Secondary | ICD-10-CM | POA: Diagnosis not present

## 2020-09-09 DIAGNOSIS — E1142 Type 2 diabetes mellitus with diabetic polyneuropathy: Secondary | ICD-10-CM

## 2020-09-09 DIAGNOSIS — Z0001 Encounter for general adult medical examination with abnormal findings: Secondary | ICD-10-CM

## 2020-09-09 DIAGNOSIS — I152 Hypertension secondary to endocrine disorders: Secondary | ICD-10-CM

## 2020-09-09 DIAGNOSIS — E785 Hyperlipidemia, unspecified: Secondary | ICD-10-CM

## 2020-09-09 DIAGNOSIS — E1169 Type 2 diabetes mellitus with other specified complication: Secondary | ICD-10-CM

## 2020-09-09 DIAGNOSIS — N183 Chronic kidney disease, stage 3 unspecified: Secondary | ICD-10-CM | POA: Diagnosis not present

## 2020-09-09 DIAGNOSIS — E1122 Type 2 diabetes mellitus with diabetic chronic kidney disease: Secondary | ICD-10-CM | POA: Diagnosis not present

## 2020-09-09 LAB — BAYER DCA HB A1C WAIVED: HB A1C (BAYER DCA - WAIVED): 6.4 % (ref <7.0)

## 2020-09-09 MED ORDER — RALOXIFENE HCL 60 MG PO TABS
60.0000 mg | ORAL_TABLET | Freq: Every day | ORAL | 3 refills | Status: DC
Start: 2020-09-09 — End: 2021-09-25

## 2020-09-09 MED ORDER — TRIAMTERENE-HCTZ 37.5-25 MG PO CAPS: ORAL_CAPSULE | ORAL | 2 refills | Status: DC

## 2020-09-09 MED ORDER — FELODIPINE ER 2.5 MG PO TB24: ORAL_TABLET | ORAL | 3 refills | Status: DC

## 2020-09-09 MED ORDER — GABAPENTIN 300 MG PO CAPS: 300.0000 mg | ORAL_CAPSULE | Freq: Every day | ORAL | 2 refills | Status: DC

## 2020-09-09 MED ORDER — FOSINOPRIL SODIUM 40 MG PO TABS: ORAL_TABLET | ORAL | 3 refills | Status: DC

## 2020-09-09 MED ORDER — METFORMIN HCL 1000 MG PO TABS
ORAL_TABLET | ORAL | 3 refills | Status: DC
Start: 2020-09-09 — End: 2020-12-21

## 2020-09-09 MED ORDER — ROSUVASTATIN CALCIUM 10 MG PO TABS: ORAL_TABLET | ORAL | 3 refills | Status: DC

## 2020-09-09 NOTE — Progress Notes (Signed)
Sara Holmes is a 87 y.o. female presents to office today for annual physical exam examination.    Concerns today include: 1.  Type 2 Diabetes with hypertension, hyperlipidemia and CKD3:  Patient reports compliance with her metformin 1000 mg each morning and 500 mg each evening.  She is compliant with Dyazide, Monopril and Crestor.  Denies any concerning symptoms or signs of hyperkalemia.  Last eye exam: Up-to-date Last foot exam: Up-to-date Last A1c:  Lab Results  Component Value Date   HGBA1C 7.0 (H) 03/21/2020   Nephropathy screen indicated?:  On a ACE inhibitor Last flu, zoster and/or pneumovax:  Immunization History  Administered Date(s) Administered   Fluad Quad(high Dose 65+) 12/14/2019   Influenza Whole 12/28/2008   Influenza, High Dose Seasonal PF 12/11/2016, 12/20/2017   Influenza,inj,Quad PF,6+ Mos 12/03/2012, 01/01/2014, 12/31/2014, 11/28/2015   Influenza,inj,quad, With Preservative 12/03/2018   Influenza-Unspecified 12/04/2018   Moderna Sars-Covid-2 Vaccination 05/11/2019, 06/08/2019, 01/26/2020, 06/21/2020   Pneumococcal Polysaccharide-23 03/31/2010   Zoster Recombinat (Shingrix) 11/07/2016, 04/01/2017   Zoster, Live 01/28/2007    ROS: Denies dizziness, LOC, polyuria, polydipsia, unintended weight loss/gain, foot ulcerations, numbness or tingling in extremities, shortness of breath or chest pain.   Occupation: Retired, Marital status: Single, Substance use: None Diet: Good appetite, Exercise: Tries to stay physically active but admits she is not as active as she could be Last eye exam: Up-to-date Last mammogram: Up-to-date.  Plan to discontinue after this year Immunizations needed:  Immunization History  Administered Date(s) Administered   Fluad Quad(high Dose 65+) 12/14/2019   Influenza Whole 12/28/2008   Influenza, High Dose Seasonal PF 12/11/2016, 12/20/2017   Influenza,inj,Quad PF,6+ Mos 12/03/2012, 01/01/2014, 12/31/2014, 11/28/2015    Influenza,inj,quad, With Preservative 12/03/2018   Influenza-Unspecified 12/04/2018   Moderna Sars-Covid-2 Vaccination 05/11/2019, 06/08/2019, 01/26/2020, 06/21/2020   Pneumococcal Polysaccharide-23 03/31/2010   Zoster Recombinat (Shingrix) 11/07/2016, 04/01/2017   Zoster, Live 01/28/2007     Past Medical History:  Diagnosis Date   Diabetes mellitus without complication ([place])    Diverticulosis    Glaucoma    Gout    Hyperlipidemia    Hypertension    Osteopenia    Plantar fasciitis    Sinus bradycardia    Vertigo    Social History   Socioeconomic History   Marital status: Widowed    Spouse name: Not on file   Number of children: Not on file   Years of education: Not on file   Highest education level: Not on file  Occupational History   Not on file  Tobacco Use   Smoking status: Never   Smokeless tobacco: Never  Vaping Use   Vaping Use: Never used  Substance and Sexual Activity   Alcohol use: No   Drug use: No   Sexual activity: Never  Other Topics Concern   Not on file  Social History Narrative   Not on file   Social Determinants of Health   Financial Resource Strain: Not on file  Food Insecurity: Not on file  Transportation Needs: Not on file  Physical Activity: Not on file  Stress: Not on file  Social Connections: Not on file  Intimate Partner Violence: Not on file   Past Surgical History:  Procedure Laterality Date   ABDOMINAL HYSTERECTOMY     CHOLECYSTECTOMY     EYE SURGERY     cataracts   NASAL SEPTUM SURGERY     Family History  Problem Relation Age of Onset   Heart disease Mother    Hypertension Mother  Cancer Father        lymp nodes   Hypertension Brother     Current Outpatient Medications:    allopurinol (ZYLOPRIM) 100 MG tablet, Take 1 tablet (100 mg total) by mouth daily., Disp: 90 tablet, Rfl: 3   aspirin 81 MG tablet, Take 81 mg by mouth daily., Disp: , Rfl:    Calcium Carbonate-Vitamin D (CALTRATE 600+D PO), Take 1 tablet by  mouth 2 (two) times daily., Disp: , Rfl:    Cholecalciferol (VITAMIN D3) 1000 UNITS CAPS, Take 1 tablet by mouth daily., Disp: , Rfl:    diclofenac sodium (VOLTAREN) 1 % GEL, Apply 4 g topically 4 (four) times daily. (Patient not taking: Reported on 03/21/2020), Disp: 100 g, Rfl: 3   felodipine (PLENDIL) 2.5 MG 24 hr tablet, TAKE 1 TABLET BY MOUTH DAILY., Disp: 90 tablet, Rfl: 3   fluticasone (CUTIVATE) 0.05 % cream, Apply topically 2 (two) times daily. (Patient not taking: Reported on 03/21/2020), Disp: 30 g, Rfl: 2   fosinopril (MONOPRIL) 40 MG tablet, TAKE 1 TABLET (40 MG TOTAL) BY MOUTH DAILY., Disp: 90 tablet, Rfl: 3   gabapentin (NEURONTIN) 300 MG capsule, TAKE 1 CAPSULE AT BEDTIME, Disp: 90 capsule, Rfl: 0   glucose blood (ONETOUCH ULTRA) test strip, CHECK BLOOD SUGAR ONCE A DAY Dx E11.9, Disp: 100 strip, Rfl: 3   Lancets (ONETOUCH DELICA PLUS [id number]) MISC, CHECK BLOOD SUGAR ONCE DAILY OR AS DIRECTED Dx E11.42, Disp: 100 each, Rfl: 3   meclizine (ANTIVERT) 25 MG tablet, TAKE (1) TABLET THREE TIMES DAILY AS NEEDED DIZZINESS, Disp: 30 tablet, Rfl: 0   metFORMIN (GLUCOPHAGE) 1000 MG tablet, TAKE 1 TABLET in the am and 1/2 tablet by mouth every pm, Disp: 135 tablet, Rfl: 3   raloxifene (EVISTA) 60 MG tablet, Take 1 tablet (60 mg total) by mouth daily., Disp: 90 tablet, Rfl: 3   rosuvastatin (CRESTOR) 10 MG tablet, TAKE (1/2) TABLET DAILY., Disp: 45 tablet, Rfl: 0   triamterene-hydrochlorothiazide (DYAZIDE) 37.5-25 MG capsule, TAKE (1) CAPSULE DAILY, Disp: 90 capsule, Rfl: 0  Allergies  Allergen Reactions   Prednisone Other (See Comments)    Elevated blood sugar   Amoxicillin Itching and Rash   Tetanus Toxoids Rash     ROS: Review of Systems A comprehensive review of systems was negative except for: Integument/breast: positive for easy bruising    Physical exam BP 138/88   Pulse 88   Temp 98.7 F (37.1 C)   Ht 5' (1.524 m)   Wt 151 lb 9.6 oz (68.8 kg)   SpO2 98%   BMI 29.61  kg/m  General appearance: alert, cooperative, appears stated age, and no distress Head: Normocephalic, without obvious abnormality, atraumatic Eyes: negative findings: lids and lashes normal, conjunctivae and sclerae normal, and corneas clear Ears: normal TM's and external ear canals both ears Nose: Nares normal. Septum midline. Mucosa normal. No drainage or sinus tenderness. Throat: lips, mucosa, and tongue normal; teeth and gums normal Neck: no adenopathy, no carotid bruit, supple, symmetrical, trachea midline, and thyroid not enlarged, symmetric, no tenderness/mass/nodules Back: symmetric, no curvature. ROM normal. No CVA tenderness. Lungs: clear to auscultation bilaterally Heart: regular rate and rhythm, S1, S2 normal, no murmur, click, rub or gallop Abdomen: soft, non-tender; bowel sounds normal; no masses,  no organomegaly Extremities: extremities normal, atraumatic, no cyanosis or edema Pulses: 2+ and symmetric Skin:  Senile purpura noted along the upper extremities bilaterally Lymph nodes: Cervical, supraclavicular, and axillary nodes normal. Neurologic: Alert and oriented X 3, normal  strength and tone. Normal symmetric reflexes. Normal coordination and gait Psych: Pleasant, interactive.  Very talkative and engaging   Assessment/ Plan: [name] here for annual physical exam.   Annual physical exam  Type 2 diabetes mellitus with diabetic polyneuropathy, without long-term current use of insulin ([place]) - Plan: Bayer DCA Hb A1c Waived, metFORMIN (GLUCOPHAGE) 1000 MG tablet  Stage 3a chronic kidney disease ([place]) - Plan: Renal Function Panel, CBC, VITAMIN D 25 Hydroxy (Vit-D Deficiency, Fractures), Uric Acid, fosinopril (MONOPRIL) 40 MG tablet  Hyperlipidemia associated with type 2 diabetes mellitus ([place]) - Plan: Hepatic Function Panel, Lipid Panel, rosuvastatin (CRESTOR) 10 MG tablet  Hypertension associated with diabetes ([place]) - Plan: Renal Function Panel, Hepatic Function  Panel, triamterene-hydrochlorothiazide (DYAZIDE) 37.5-25 MG capsule  Diabetic polyneuropathy associated with type 2 diabetes mellitus ([place]) - Plan: gabapentin (NEURONTIN) 300 MG capsule  Osteopenia with high risk of fracture - Plan: raloxifene (EVISTA) 60 MG tablet  Ok to discontinue mammogram.  Sugars under excellent control.  Continue current regimen. She has only mild reduction in GFR so no med adjustment needed  Continue statin.   BP controlled. No concerning symptoms for hyperkalemia with triamterene and ace-I. No changes for now.  Continue gabapentin  Continue evista   Counseled on healthy lifestyle choices, including diet (rich in fruits, vegetables and lean meats and low in salt and simple carbohydrates) and exercise (at least 30 minutes of moderate physical activity daily).  Patient to follow up in 1 year for annual exam or sooner if needed.  Judit Awad M. Lajuana Ripple, DO

## 2020-09-09 NOTE — Patient Instructions (Signed)
You had labs performed today.  You will be contacted with the results of the labs once they are available, usually in the next 3 business days for routine lab work.  If you have an active my chart account, they will be released to your MyChart.  If you prefer to have these labs released to you via telephone, please let us know.  If you had a pap smear or biopsy performed, expect to be contacted in about 7-10 days.  Preventive Care 7 Years and Older, Female Preventive care refers to lifestyle choices and visits with your health care provider that can promote health and wellness. This includes: A yearly physical exam. This is also called an annual wellness visit. Regular dental and eye exams. Immunizations. Screening for certain conditions. Healthy lifestyle choices, such as: Eating a healthy diet. Getting regular exercise. Not using drugs or products that contain nicotine and tobacco. Limiting alcohol use. What can I expect for my preventive care visit? Physical exam Your health care provider will check your: Height and weight. These may be used to calculate your BMI (body mass index). BMI is a measurement that tells if you are at a healthy weight. Heart rate and blood pressure. Body temperature. Skin for abnormal spots. Counseling Your health care provider may ask you questions about your: Past medical problems. Family's medical history. Alcohol, tobacco, and drug use. Emotional well-being. Home life and relationship well-being. Sexual activity. Diet, exercise, and sleep habits. History of falls. Memory and ability to understand (cognition). Work and work Statistician. Pregnancy and menstrual history. Access to firearms. What immunizations do I need?  Vaccines are usually given at various ages, according to a schedule. Your health care provider will recommend vaccines for you based on your age, medicalhistory, and lifestyle or other factors, such as travel or where you  work. What tests do I need? Blood tests Lipid and cholesterol levels. These may be checked every 5 years, or more often depending on your overall health. Hepatitis C test. Hepatitis B test. Screening Lung cancer screening. You may have this screening every year starting at age 63 if you have a 30-pack-year history of smoking and currently smoke or have quit within the past 15 years. Colorectal cancer screening. All adults should have this screening starting at age 40 and continuing until age 56. Your health care provider may recommend screening at age 44 if you are at increased risk. You will have tests every 1-10 years, depending on your results and the type of screening test. Diabetes screening. This is done by checking your blood sugar (glucose) after you have not eaten for a while (fasting). You may have this done every 1-3 years. Mammogram. This may be done every 1-2 years. Talk with your health care provider about how often you should have regular mammograms. Abdominal aortic aneurysm (AAA) screening. You may need this if you are a current or former smoker. BRCA-related cancer screening. This may be done if you have a family history of breast, ovarian, tubal, or peritoneal cancers. Other tests STD (sexually transmitted disease) testing, if you are at risk. Bone density scan. This is done to screen for osteoporosis. You may have this done starting at age 76. Talk with your health care provider about your test results, treatment options,and if necessary, the need for more tests. Follow these instructions at home: Eating and drinking  Eat a diet that includes fresh fruits and vegetables, whole grains, lean protein, and low-fat dairy products. Limit your intake of foods with  high amounts of sugar, saturated fats, and salt. Take vitamin and mineral supplements as recommended by your health care provider. Do not drink alcohol if your health care provider tells you not to drink. If you  drink alcohol: Limit how much you have to 0-1 drink a day. Be aware of how much alcohol is in your drink. In the [place] one drink equals one 12 oz bottle of beer (355 mL), one 5 oz glass of wine (148 mL), or one 1 oz glass of hard liquor (44 mL).  Lifestyle Take daily care of your teeth and gums. Brush your teeth every morning and night with fluoride toothpaste. Floss one time each day. Stay active. Exercise for at least 30 minutes 5 or more days each week. Do not use any products that contain nicotine or tobacco, such as cigarettes, e-cigarettes, and chewing tobacco. If you need help quitting, ask your health care provider. Do not use drugs. If you are sexually active, practice safe sex. Use a condom or other form of protection in order to prevent STIs (sexually transmitted infections). Talk with your health care provider about taking a low-dose aspirin or statin. Find healthy ways to cope with stress, such as: Meditation, yoga, or listening to music. Journaling. Talking to a trusted person. Spending time with friends and family. Safety Always wear your seat belt while driving or riding in a vehicle. Do not drive: If you have been drinking alcohol. Do not ride with someone who has been drinking. When you are tired or distracted. While texting. Wear a helmet and other protective equipment during sports activities. If you have firearms in your house, make sure you follow all gun safety procedures. What's next? Visit your health care provider once a year for an annual wellness visit. Ask your health care provider how often you should have your eyes and teeth checked. Stay up to date on all vaccines. This information is not intended to replace advice given to you by your health care provider. Make sure you discuss any questions you have with your healthcare provider. Document Revised: 02/17/2020 Document Reviewed: 02/20/2018 Elsevier Patient Education  2022 [employer].

## 2020-09-10 LAB — LIPID PANEL
Chol/HDL Ratio: 2.1 ratio (ref 0.0–4.4)
Cholesterol, Total: 135 mg/dL (ref 100–199)
HDL: 64 mg/dL (ref >39)
LDL Chol Calc (NIH): 50 mg/dL (ref 0–99)
Triglycerides: 116 mg/dL (ref 0–149)
VLDL Cholesterol Cal: 21 mg/dL (ref 5–40)

## 2020-09-10 LAB — CBC
Hematocrit: 35.8 % (ref 34.0–46.6)
Hemoglobin: 11.6 g/dL (ref 11.1–15.9)
MCH: 30.7 pg (ref 26.6–33.0)
MCHC: 32.4 g/dL (ref 31.5–35.7)
MCV: 95 fL (ref 79–97)
Platelets: 224 10*3/uL (ref 150–450)
RBC: 3.78 x10E6/uL (ref 3.77–5.28)
RDW: 12.6 % (ref 11.7–15.4)
WBC: 6 10*3/uL (ref 3.4–10.8)

## 2020-09-10 LAB — RENAL FUNCTION PANEL
Albumin: 4.4 g/dL (ref 3.6–4.6)
BUN/Creatinine Ratio: 14 (ref 12–28)
BUN: 15 mg/dL (ref 8–27)
CO2: 25 mmol/L (ref 20–29)
Calcium: 9.5 mg/dL (ref 8.7–10.3)
Chloride: 103 mmol/L (ref 96–106)
Creatinine, Ser: 1.07 mg/dL — ABNORMAL HIGH (ref 0.57–1.00)
Glucose: 140 mg/dL — ABNORMAL HIGH (ref 65–99)
Phosphorus: 3.9 mg/dL (ref 3.0–4.3)
Potassium: 4.4 mmol/L (ref 3.5–5.2)
Sodium: 145 mmol/L — ABNORMAL HIGH (ref 134–144)
eGFR: 50 mL/min/{1.73_m2} — ABNORMAL LOW (ref >59)

## 2020-09-10 LAB — HEPATIC FUNCTION PANEL
ALT: 10 IU/L (ref 0–32)
AST: 20 IU/L (ref 0–40)
Alkaline Phosphatase: 54 IU/L (ref 44–121)
Bilirubin Total: 0.9 mg/dL (ref 0.0–1.2)
Bilirubin, Direct: 0.3 mg/dL (ref 0.00–0.40)
Total Protein: 6.1 g/dL (ref 6.0–8.5)

## 2020-09-10 LAB — URIC ACID: Uric Acid: 5.6 mg/dL (ref 3.1–7.9)

## 2020-09-10 LAB — VITAMIN D 25 HYDROXY (VIT D DEFICIENCY, FRACTURES): Vit D, 25-Hydroxy: 54.8 ng/mL (ref 30.0–100.0)

## 2020-09-16 ENCOUNTER — Encounter: Payer: PPO | Admitting: Family Medicine

## 2020-10-10 DIAGNOSIS — I872 Venous insufficiency (chronic) (peripheral): Secondary | ICD-10-CM | POA: Diagnosis not present

## 2020-10-18 DIAGNOSIS — H903 Sensorineural hearing loss, bilateral: Secondary | ICD-10-CM | POA: Diagnosis not present

## 2020-10-19 ENCOUNTER — Other Ambulatory Visit: Payer: Self-pay | Admitting: Family Medicine

## 2020-10-25 DIAGNOSIS — E1142 Type 2 diabetes mellitus with diabetic polyneuropathy: Secondary | ICD-10-CM | POA: Diagnosis not present

## 2020-10-25 DIAGNOSIS — M79676 Pain in unspecified toe(s): Secondary | ICD-10-CM | POA: Diagnosis not present

## 2020-10-25 DIAGNOSIS — L84 Corns and callosities: Secondary | ICD-10-CM | POA: Diagnosis not present

## 2020-10-25 DIAGNOSIS — B351 Tinea unguium: Secondary | ICD-10-CM | POA: Diagnosis not present

## 2020-12-02 ENCOUNTER — Other Ambulatory Visit: Payer: Self-pay

## 2020-12-02 ENCOUNTER — Ambulatory Visit (INDEPENDENT_AMBULATORY_CARE_PROVIDER_SITE_OTHER): Payer: PPO | Admitting: Family Medicine

## 2020-12-02 ENCOUNTER — Encounter: Payer: Self-pay | Admitting: Family Medicine

## 2020-12-02 VITALS — BP 166/80 | HR 74 | Temp 97.7°F | Ht 60.0 in | Wt 150.2 lb

## 2020-12-02 DIAGNOSIS — S90422A Blister (nonthermal), left great toe, initial encounter: Secondary | ICD-10-CM | POA: Diagnosis not present

## 2020-12-02 DIAGNOSIS — I10 Essential (primary) hypertension: Secondary | ICD-10-CM | POA: Diagnosis not present

## 2020-12-02 DIAGNOSIS — L089 Local infection of the skin and subcutaneous tissue, unspecified: Secondary | ICD-10-CM | POA: Diagnosis not present

## 2020-12-02 MED ORDER — DOXYCYCLINE HYCLATE 100 MG PO TABS: 100.0000 mg | ORAL_TABLET | Freq: Two times a day (BID) | ORAL | 0 refills | Status: AC

## 2020-12-02 NOTE — Patient Instructions (Signed)
Wound Care, Adult Taking care of your wound properly can help to prevent pain, infection, and scarring. It can also help your wound heal more quickly. Follow instructionsfrom your health care provider about how to care for your wound. Supplies needed: Soap and water. Wound cleanser. Gauze. If needed, a clean bandage (dressing) or other type of wound dressing material to cover or place in the wound. Follow your health care provider's instructions about what dressing supplies to use. Cream or ointment to apply to the wound, if told by your health care provider. How to care for your wound Cleaning the wound Ask your health care provider how to clean the wound. This may include: Using mild soap and water or a wound cleanser. Using a clean gauze to pat the wound dry after cleaning it. Do not rub or scrub the wound. Dressing care Wash your hands with soap and water for at least 20 seconds before and after you change the dressing. If soap and water are not available, use hand sanitizer. Change your dressing as told by your health care provider. This may include: Cleaning or rinsing out (irrigating) the wound. Placing a dressing over the wound or in the wound (packing). Covering the wound with an outer dressing. Leave any stitches (sutures), skin glue, or adhesive strips in place. These skin closures may need to stay in place for 2 weeks or longer. If adhesive strip edges start to loosen and curl up, you may trim the loose edges. Do not remove adhesive strips completely unless your health care provider tells you to do that. Ask your health care provider when you can leave the wound uncovered. Checking for infection Check your wound area every day for signs of infection. Check for: More redness, swelling, or pain. Fluid or blood. Warmth. Pus or a bad smell.  Follow these instructions at home Medicines If you were prescribed an antibiotic medicine, cream, or ointment, take or apply it as told by  your health care provider. Do not stop using the antibiotic even if your condition improves. If you were prescribed pain medicine, take it 30 minutes before you do any wound care or as told by your health care provider. Take over-the-counter and prescription medicines only as told by your health care provider. Eating and drinking Eat a diet that includes protein, vitamin A, vitamin C, and other nutrient-rich foods to help the wound heal. Foods rich in protein include meat, fish, eggs, dairy, beans, and nuts. Foods rich in vitamin A include carrots and dark green, leafy vegetables. Foods rich in vitamin C include citrus fruits, tomatoes, broccoli, and peppers. Drink enough fluid to keep your urine pale yellow. General instructions Do not take baths, swim, use a hot tub, or do anything that would put the wound underwater until your health care provider approves. Ask your health care provider if you may take showers. You may only be allowed to take sponge baths. Do not scratch or pick at the wound. Keep it covered as told by your health care provider. Return to your normal activities as told by your health care provider. Ask your health care provider what activities are safe for you. Protect your wound from the sun when you are outside for the first 6 months, or for as long as told by your health care provider. Cover up the scar area or apply sunscreen that has an SPF of at least 30. Do not use any products that contain nicotine or tobacco, such as cigarettes, e-cigarettes, and chewing tobacco.   These may delay wound healing. If you need help quitting, ask your health care provider. Keep all follow-up visits as told by your health care provider. This is important. Contact a health care provider if: You received a tetanus shot and you have swelling, severe pain, redness, or bleeding at the injection site. Your pain is not controlled with medicine. You have any of these signs of infection: More  redness, swelling, or pain around the wound. Fluid or blood coming from the wound. Warmth coming from the wound. Pus or a bad smell coming from the wound. A fever or chills. You are nauseous or you vomit. You are dizzy. Get help right away if: You have a red streak of skin near the area around your wound. Your wound has been closed with staples, sutures, skin glue, or adhesive strips and it begins to open up and separate. Your wound is bleeding, and the bleeding does not stop with gentle pressure. You have a rash. You faint. You have trouble breathing. These symptoms may represent a serious problem that is an emergency. Do not wait to see if the symptoms will go away. Get medical help right away. Call your local emergency services (911 in the [place] Do not drive yourself to the hospital. Summary Always wash your hands with soap and water for at least 20 seconds before and after changing your dressing. Change your dressing as told by your health care provider. To help with healing, eat foods that are rich in protein, vitamin A, vitamin C, and other nutrients. Check your wound every day for signs of infection. Contact your health care provider if you suspect that your wound is infected. This information is not intended to replace advice given to you by your health care provider. Make sure you discuss any questions you have with your healthcare provider. Document Revised: 12/12/2018 Document Reviewed: 12/12/2018 Elsevier Patient Education  2022 [employer]  

## 2020-12-02 NOTE — Progress Notes (Signed)
Acute Office Visit  Subjective:    Patient ID: Sara Holmes, female    DOB: 1928/12/05, 87 y.o.   MRN: [id number]  Chief Complaint  Patient presents with   Blister    HPI Patient is in today for a blister on her left big toe x 2 days. She believes this started after wearing some leather  shoes. She reports increased tenderness. The blister is now yellow in color with clear drainage. Denies swelling, fever, or chills. It does look a little red. She has been using a bandaid to cover the blister. She has a history of DM and venous insuffiencey.   She reports that she was in a rush to get to her appointment this morning. She has not taken her BP meds. She reports her BP is well controlled when she checks it at home.    BP Readings from Last 3 Encounters:  12/02/20 (!) 166/80  09/09/20 138/88  03/21/20 130/80    Past Medical History:  Diagnosis Date   Diabetes mellitus without complication (HCC)    Diverticulosis    Glaucoma    Gout    Hyperlipidemia    Hypertension    Osteopenia    Plantar fasciitis    Sinus bradycardia    Vertigo     Past Surgical History:  Procedure Laterality Date   ABDOMINAL HYSTERECTOMY     CHOLECYSTECTOMY     EYE SURGERY     cataracts   NASAL SEPTUM SURGERY      Family History  Problem Relation Age of Onset   Heart disease Mother    Hypertension Mother    Cancer Father        lymp nodes   Hypertension Brother     Social History   Socioeconomic History   Marital status: Widowed    Spouse name: Not on file   Number of children: Not on file   Years of education: Not on file   Highest education level: Not on file  Occupational History   Not on file  Tobacco Use   Smoking status: Never   Smokeless tobacco: Never  Vaping Use   Vaping Use: Never used  Substance and Sexual Activity   Alcohol use: No   Drug use: No   Sexual activity: Never  Other Topics Concern   Not on file  Social History Narrative   Not on file   Social  Determinants of Health   Financial Resource Strain: Not on file  Food Insecurity: Not on file  Transportation Needs: Not on file  Physical Activity: Not on file  Stress: Not on file  Social Connections: Not on file  Intimate Partner Violence: Not on file    Outpatient Medications Prior to Visit  Medication Sig Dispense Refill   allopurinol (ZYLOPRIM) 100 MG tablet TAKE 1 TABLET EVERY DAY 90 tablet 1   aspirin 81 MG tablet Take 81 mg by mouth daily.     Calcium Carbonate-Vitamin D (CALTRATE 600+D PO) Take 1 tablet by mouth 2 (two) times daily.     Cholecalciferol (VITAMIN D3) 1000 UNITS CAPS Take 1 tablet by mouth daily.     diclofenac sodium (VOLTAREN) 1 % GEL Apply 4 g topically 4 (four) times daily. 100 g 3   felodipine (PLENDIL) 2.5 MG 24 hr tablet TAKE 1 TABLET BY MOUTH DAILY. 90 tablet 3   fluticasone (CUTIVATE) 0.05 % cream Apply topically 2 (two) times daily. 30 g 2   fosinopril (MONOPRIL) 40 MG tablet TAKE  1 TABLET (40 MG TOTAL) BY MOUTH DAILY. 90 tablet 3   gabapentin (NEURONTIN) 300 MG capsule Take 1 capsule (300 mg total) by mouth at bedtime. 90 capsule 2   glucose blood (ONETOUCH ULTRA) test strip CHECK BLOOD SUGAR ONCE A DAY Dx E11.9 100 strip 3   Lancets (ONETOUCH DELICA PLUS [id number]) MISC CHECK BLOOD SUGAR ONCE DAILY OR AS DIRECTED Dx E11.42 100 each 3   meclizine (ANTIVERT) 25 MG tablet TAKE (1) TABLET THREE TIMES DAILY AS NEEDED DIZZINESS 30 tablet 0   metFORMIN (GLUCOPHAGE) 1000 MG tablet TAKE 1 TABLET in the am and 1/2 tablet by mouth every pm 135 tablet 3   raloxifene (EVISTA) 60 MG tablet Take 1 tablet (60 mg total) by mouth daily. 90 tablet 3   rosuvastatin (CRESTOR) 10 MG tablet TAKE (1/2) TABLET DAILY. 45 tablet 3   triamterene-hydrochlorothiazide (DYAZIDE) 37.5-25 MG capsule TAKE (1) CAPSULE DAILY 90 capsule 2   No facility-administered medications prior to visit.    Allergies  Allergen Reactions   Prednisone Other (See Comments)    Elevated blood sugar    Amoxicillin Itching and Rash   Tetanus Toxoids Rash    Review of Systems As per HPI.    Objective:    Physical Exam Vitals and nursing note reviewed.  Constitutional:      General: She is not in acute distress.    Appearance: She is not ill-appearing, toxic-appearing or diaphoretic.  Pulmonary:     Effort: Pulmonary effort is normal. No respiratory distress.  Feet:     Left foot:     Skin integrity: Blister (lateral aspect of great toe with surround eythema, tenderness, and yellow drainage) present.  Skin:    General: Skin is warm and dry.  Neurological:     Mental Status: She is alert and oriented to person, place, and time. Mental status is at baseline.  Psychiatric:        Mood and Affect: Mood normal.        Behavior: Behavior normal.    BP (!) 166/80   Pulse 74   Temp 97.7 F (36.5 C) (Temporal)   Ht 5' (1.524 m)   Wt 150 lb 4 oz (68.2 kg)   BMI 29.34 kg/m  Wt Readings from Last 3 Encounters:  12/02/20 150 lb 4 oz (68.2 kg)  09/09/20 151 lb 9.6 oz (68.8 kg)  03/21/20 155 lb (70.3 kg)    There are no preventive care reminders to display for this patient.  There are no preventive care reminders to display for this patient.   No results found for: TSH Lab Results  Component Value Date   WBC 6.0 09/09/2020   HGB 11.6 09/09/2020   HCT 35.8 09/09/2020   MCV 95 09/09/2020   PLT 224 09/09/2020   Lab Results  Component Value Date   NA 145 (H) 09/09/2020   K 4.4 09/09/2020   CO2 25 09/09/2020   GLUCOSE 140 (H) 09/09/2020   BUN 15 09/09/2020   CREATININE 1.07 (H) 09/09/2020   BILITOT 0.9 09/09/2020   ALKPHOS 54 09/09/2020   AST 20 09/09/2020   ALT 10 09/09/2020   PROT 6.1 09/09/2020   ALBUMIN 4.4 09/09/2020   CALCIUM 9.5 09/09/2020   EGFR 50 (L) 09/09/2020   Lab Results  Component Value Date   CHOL 135 09/09/2020   Lab Results  Component Value Date   HDL 64 09/09/2020   Lab Results  Component Value Date   LDLCALC 50 09/09/2020  Lab  Results  Component Value Date   TRIG 116 09/09/2020   Lab Results  Component Value Date   CHOLHDL 2.1 09/09/2020   Lab Results  Component Value Date   HGBA1C 6.4 09/09/2020       Assessment & Plan:   Sara Holmes was seen today for blister.  Diagnoses and all orders for this visit:  Infected blister of great toe of left foot, initial encounter Doxycyline as below. Discussed home wound care. Strict return precautions given. Follow up in 1 week.  -     doxycycline (VIBRA-TABS) 100 MG tablet; Take 1 tablet (100 mg total) by mouth 2 (two) times daily for 7 days. 1 po bid  Essential hypertension Elevated today, however patient did not take BP meds this am. Reports normally well controlled at home. She will monitor BP at home and notify for elevated readings.   Return in about 1 week (around 12/09/2020), or if symptoms worsen or fail to improve, for wound follow up.  The patient indicates understanding of these issues and agrees with the plan.  Gwenlyn Perking, FNP

## 2020-12-05 ENCOUNTER — Telehealth: Payer: Self-pay | Admitting: Family Medicine

## 2020-12-05 NOTE — Telephone Encounter (Signed)
Pt aware.

## 2020-12-05 NOTE — Telephone Encounter (Signed)
She can do epsom salt soaks with lukewarm water for about 20 minutes at a time. The antibiotic that she is on will take care of any infection.

## 2020-12-08 ENCOUNTER — Ambulatory Visit (INDEPENDENT_AMBULATORY_CARE_PROVIDER_SITE_OTHER): Payer: PPO | Admitting: Family Medicine

## 2020-12-08 ENCOUNTER — Encounter: Payer: Self-pay | Admitting: Family Medicine

## 2020-12-08 ENCOUNTER — Other Ambulatory Visit: Payer: Self-pay

## 2020-12-08 VITALS — BP 178/73 | HR 66 | Temp 97.7°F | Ht 60.0 in | Wt 152.1 lb

## 2020-12-08 DIAGNOSIS — S90422A Blister (nonthermal), left great toe, initial encounter: Secondary | ICD-10-CM

## 2020-12-08 DIAGNOSIS — I10 Essential (primary) hypertension: Secondary | ICD-10-CM | POA: Diagnosis not present

## 2020-12-08 DIAGNOSIS — L089 Local infection of the skin and subcutaneous tissue, unspecified: Secondary | ICD-10-CM

## 2020-12-08 DIAGNOSIS — S90422D Blister (nonthermal), left great toe, subsequent encounter: Secondary | ICD-10-CM | POA: Diagnosis not present

## 2020-12-08 NOTE — Progress Notes (Signed)
-  Acute Office Visit  Subjective:    Patient ID: Sara Holmes, female    DOB: 04/29/1928, 87 y.o.   MRN: [id number]  Chief Complaint  Patient presents with   Blister    HPI Patient is in today for wound follow up. She will completed keflex for an infected blister on her her left great toe today. She denies swelling, drainage, erythema, fever, chills, or tenderness. She has been doing epsom salt soaks as well. She has been keeping the area clean and dry. She has been covering it when she wears shoes.   She reports that her BP was 160s/70s when she checked it last at home a few days ago. She reports that it always elevated when she comes to a doctors office. She is also worried about the upcoming storm this weekend. Denies chest pain, shortness of breath, dizziness, palpitations, edema, focal weakness, confusion, or changes in vision.     Past Medical History:  Diagnosis Date   Diabetes mellitus without complication (HCC)    Diverticulosis    Glaucoma    Gout    Hyperlipidemia    Hypertension    Osteopenia    Plantar fasciitis    Sinus bradycardia    Vertigo     Past Surgical History:  Procedure Laterality Date   ABDOMINAL HYSTERECTOMY     CHOLECYSTECTOMY     EYE SURGERY     cataracts   NASAL SEPTUM SURGERY      Family History  Problem Relation Age of Onset   Heart disease Mother    Hypertension Mother    Cancer Father        lymp nodes   Hypertension Brother     Social History   Socioeconomic History   Marital status: Widowed    Spouse name: Not on file   Number of children: Not on file   Years of education: Not on file   Highest education level: Not on file  Occupational History   Not on file  Tobacco Use   Smoking status: Never   Smokeless tobacco: Never  Vaping Use   Vaping Use: Never used  Substance and Sexual Activity   Alcohol use: No   Drug use: No   Sexual activity: Never  Other Topics Concern   Not on file  Social History Narrative    Not on file   Social Determinants of Health   Financial Resource Strain: Not on file  Food Insecurity: Not on file  Transportation Needs: Not on file  Physical Activity: Not on file  Stress: Not on file  Social Connections: Not on file  Intimate Partner Violence: Not on file    Outpatient Medications Prior to Visit  Medication Sig Dispense Refill   allopurinol (ZYLOPRIM) 100 MG tablet TAKE 1 TABLET EVERY DAY 90 tablet 1   aspirin 81 MG tablet Take 81 mg by mouth daily.     Calcium Carbonate-Vitamin D (CALTRATE 600+D PO) Take 1 tablet by mouth 2 (two) times daily.     Cholecalciferol (VITAMIN D3) 1000 UNITS CAPS Take 1 tablet by mouth daily.     diclofenac sodium (VOLTAREN) 1 % GEL Apply 4 g topically 4 (four) times daily. 100 g 3   doxycycline (VIBRA-TABS) 100 MG tablet Take 1 tablet (100 mg total) by mouth 2 (two) times daily for 7 days. 1 po bid 14 tablet 0   felodipine (PLENDIL) 2.5 MG 24 hr tablet TAKE 1 TABLET BY MOUTH DAILY. 90 tablet 3  fluticasone (CUTIVATE) 0.05 % cream Apply topically 2 (two) times daily. 30 g 2   fosinopril (MONOPRIL) 40 MG tablet TAKE 1 TABLET (40 MG TOTAL) BY MOUTH DAILY. 90 tablet 3   gabapentin (NEURONTIN) 300 MG capsule Take 1 capsule (300 mg total) by mouth at bedtime. 90 capsule 2   glucose blood (ONETOUCH ULTRA) test strip CHECK BLOOD SUGAR ONCE A DAY Dx E11.9 100 strip 3   Lancets (ONETOUCH DELICA PLUS [id number]) MISC CHECK BLOOD SUGAR ONCE DAILY OR AS DIRECTED Dx E11.42 100 each 3   meclizine (ANTIVERT) 25 MG tablet TAKE (1) TABLET THREE TIMES DAILY AS NEEDED DIZZINESS 30 tablet 0   metFORMIN (GLUCOPHAGE) 1000 MG tablet TAKE 1 TABLET in the am and 1/2 tablet by mouth every pm 135 tablet 3   raloxifene (EVISTA) 60 MG tablet Take 1 tablet (60 mg total) by mouth daily. 90 tablet 3   rosuvastatin (CRESTOR) 10 MG tablet TAKE (1/2) TABLET DAILY. 45 tablet 3   triamterene-hydrochlorothiazide (DYAZIDE) 37.5-25 MG capsule TAKE (1) CAPSULE DAILY 90 capsule  2   No facility-administered medications prior to visit.    Allergies  Allergen Reactions   Prednisone Other (See Comments)    Elevated blood sugar   Amoxicillin Itching and Rash   Tetanus Toxoids Rash    Review of Systems As per HPI.     Objective:    Physical Exam Vitals and nursing note reviewed.  Constitutional:      General: She is not in acute distress.    Appearance: She is not ill-appearing, toxic-appearing or diaphoretic.  Cardiovascular:     Rate and Rhythm: Normal rate and regular rhythm.     Pulses: Normal pulses.     Heart sounds: No murmur heard. Pulmonary:     Effort: Pulmonary effort is normal. No respiratory distress.     Breath sounds: Normal breath sounds.  Musculoskeletal:     Right lower leg: No edema.     Left lower leg: No edema.  Skin:    General: Skin is warm and dry.     Findings: Wound present.     Comments: 0.25 cm wound to left great toe. No drainage, erythema, or tenderness.   Neurological:     Mental Status: She is alert and oriented to person, place, and time. Mental status is at baseline.  Psychiatric:        Mood and Affect: Mood normal.        Behavior: Behavior normal.    BP (!) 178/73   Pulse 66   Temp 97.7 F (36.5 C) (Temporal)   Ht 5' (1.524 m)   Wt 152 lb 2 oz (69 kg)   BMI 29.71 kg/m  Wt Readings from Last 3 Encounters:  12/08/20 152 lb 2 oz (69 kg)  12/02/20 150 lb 4 oz (68.2 kg)  09/09/20 151 lb 9.6 oz (68.8 kg)    There are no preventive care reminders to display for this patient.  There are no preventive care reminders to display for this patient.   No results found for: TSH Lab Results  Component Value Date   WBC 6.0 09/09/2020   HGB 11.6 09/09/2020   HCT 35.8 09/09/2020   MCV 95 09/09/2020   PLT 224 09/09/2020   Lab Results  Component Value Date   NA 145 (H) 09/09/2020   K 4.4 09/09/2020   CO2 25 09/09/2020   GLUCOSE 140 (H) 09/09/2020   BUN 15 09/09/2020   CREATININE 1.07 (H) 09/09/2020  BILITOT 0.9 09/09/2020   ALKPHOS 54 09/09/2020   AST 20 09/09/2020   ALT 10 09/09/2020   PROT 6.1 09/09/2020   ALBUMIN 4.4 09/09/2020   CALCIUM 9.5 09/09/2020   EGFR 50 (L) 09/09/2020   Lab Results  Component Value Date   CHOL 135 09/09/2020   Lab Results  Component Value Date   HDL 64 09/09/2020   Lab Results  Component Value Date   LDLCALC 50 09/09/2020   Lab Results  Component Value Date   TRIG 116 09/09/2020   Lab Results  Component Value Date   CHOLHDL 2.1 09/09/2020   Lab Results  Component Value Date   HGBA1C 6.4 09/09/2020       Assessment & Plan:   Sara Holmes was seen today for blister.  Diagnoses and all orders for this visit:  Infected blister of great toe of left foot, initial encounter Improving, no signs of infection now. Discussed home wound care.  Essential hypertension Elevated today, asymptomatic. BP log given. Follow up in 2 weeks.   Return to office for new or worsening symptoms, or if symptoms persist.   The patient indicates understanding of these issues and agrees with the plan.  Gwenlyn Perking, FNP

## 2020-12-08 NOTE — Patient Instructions (Signed)

## 2020-12-21 ENCOUNTER — Encounter: Payer: Self-pay | Admitting: Family Medicine

## 2020-12-21 ENCOUNTER — Ambulatory Visit (INDEPENDENT_AMBULATORY_CARE_PROVIDER_SITE_OTHER): Payer: PPO | Admitting: Family Medicine

## 2020-12-21 VITALS — BP 138/74 | HR 65 | Temp 98.2°F | Resp 20 | Ht 60.0 in | Wt 152.0 lb

## 2020-12-21 DIAGNOSIS — E11621 Type 2 diabetes mellitus with foot ulcer: Secondary | ICD-10-CM | POA: Diagnosis not present

## 2020-12-21 DIAGNOSIS — L97521 Non-pressure chronic ulcer of other part of left foot limited to breakdown of skin: Secondary | ICD-10-CM | POA: Diagnosis not present

## 2020-12-21 DIAGNOSIS — I152 Hypertension secondary to endocrine disorders: Secondary | ICD-10-CM

## 2020-12-21 DIAGNOSIS — E1159 Type 2 diabetes mellitus with other circulatory complications: Secondary | ICD-10-CM

## 2020-12-21 DIAGNOSIS — N1831 Chronic kidney disease, stage 3a: Secondary | ICD-10-CM | POA: Diagnosis not present

## 2020-12-21 DIAGNOSIS — E1122 Type 2 diabetes mellitus with diabetic chronic kidney disease: Secondary | ICD-10-CM | POA: Diagnosis not present

## 2020-12-21 LAB — BAYER DCA HB A1C WAIVED: HB A1C (BAYER DCA - WAIVED): 6.2 % — ABNORMAL HIGH (ref 4.8–5.6)

## 2020-12-21 MED ORDER — METFORMIN HCL 1000 MG PO TABS: ORAL_TABLET | ORAL | 3 refills | Status: DC

## 2020-12-21 NOTE — Progress Notes (Signed)
Subjective: CC:f/u HTN PCP: Sara Norlander, DO JOA:CZYSA Sara Holmes is a 87 y.o. female presenting to clinic today for:  1. Type 2 Diabetes with hypertension, hyperlipidemia and CKD3a:  Pressure noted to be elevated at her last checkups for a foot lesion that she has been treated for.  She said anxiety is the precipitating event for elevated blood pressure readings and is here today for 2-week follow-up on blood pressure.  Home blood pressures have been normal.  She is compliant with all medications.  The lesion on her left toe is healing but still not totally back to normal.  Denies any drainage, pain or erythema.  She completed the antibiotics as prescribed.  She admits that she had "several gout flares recently" and was using indomethacin over the weekend for that.  Symptoms seem to be getting better  Last eye exam: UTD Last foot exam: UTD Last A1c:  Lab Results  Component Value Date   HGBA1C 6.4 09/09/2020   Nephropathy screen indicated?: on ACE-I Last flu, zoster and/or pneumovax:  Immunization History  Administered Date(s) Administered   Fluad Quad(high Dose 65+) 12/14/2019   Influenza Whole 12/28/2008   Influenza, High Dose Seasonal PF 12/11/2016, 12/20/2017   Influenza,inj,Quad PF,6+ Mos 12/03/2012, 01/01/2014, 12/31/2014, 11/28/2015   Influenza,inj,quad, With Preservative 12/03/2018   Influenza-Unspecified 12/04/2018   Moderna Covid-19 Vaccine Bivalent Booster 1yrs & up 12/13/2020   Moderna Sars-Covid-2 Vaccination 05/11/2019, 06/08/2019, 01/26/2020, 06/21/2020   Pneumococcal Polysaccharide-23 03/31/2010   Zoster Recombinat (Shingrix) 11/07/2016, 04/01/2017   Zoster, Live 01/28/2007    ROS: Reports chronic neuropathy but she is able to sense her feet.  She has no overt numbness.  ROS: Per HPI  Allergies  Allergen Reactions   Prednisone Other (See Comments)    Elevated blood sugar   Amoxicillin Itching and Rash   Tetanus Toxoids Rash   Past Medical History:   Diagnosis Date   Diabetes mellitus without complication (HCC)    Diverticulosis    Glaucoma    Gout    Hyperlipidemia    Hypertension    Osteopenia    Plantar fasciitis    Sinus bradycardia    Vertigo     Current Outpatient Medications:    allopurinol (ZYLOPRIM) 100 MG tablet, TAKE 1 TABLET EVERY DAY, Disp: 90 tablet, Rfl: 1   aspirin 81 MG tablet, Take 81 mg by mouth daily., Disp: , Rfl:    Calcium Carbonate-Vitamin D (CALTRATE 600+D PO), Take 1 tablet by mouth 2 (two) times daily., Disp: , Rfl:    Cholecalciferol (VITAMIN D3) 1000 UNITS CAPS, Take 1 tablet by mouth daily., Disp: , Rfl:    diclofenac sodium (VOLTAREN) 1 % GEL, Apply 4 g topically 4 (four) times daily., Disp: 100 g, Rfl: 3   felodipine (PLENDIL) 2.5 MG 24 hr tablet, TAKE 1 TABLET BY MOUTH DAILY., Disp: 90 tablet, Rfl: 3   fluticasone (CUTIVATE) 0.05 % cream, Apply topically 2 (two) times daily., Disp: 30 g, Rfl: 2   fosinopril (MONOPRIL) 40 MG tablet, TAKE 1 TABLET (40 MG TOTAL) BY MOUTH DAILY., Disp: 90 tablet, Rfl: 3   gabapentin (NEURONTIN) 300 MG capsule, Take 1 capsule (300 mg total) by mouth at bedtime., Disp: 90 capsule, Rfl: 2   glucose blood (ONETOUCH ULTRA) test strip, CHECK BLOOD SUGAR ONCE A DAY Dx E11.9, Disp: 100 strip, Rfl: 3   Lancets (ONETOUCH DELICA PLUS [id number]) MISC, CHECK BLOOD SUGAR ONCE DAILY OR AS DIRECTED Dx E11.42, Disp: 100 each, Rfl: 3   meclizine (  ANTIVERT) 25 MG tablet, TAKE (1) TABLET THREE TIMES DAILY AS NEEDED DIZZINESS, Disp: 30 tablet, Rfl: 0   metFORMIN (GLUCOPHAGE) 1000 MG tablet, TAKE 1 TABLET in the am and 1/2 tablet by mouth every pm, Disp: 135 tablet, Rfl: 3   raloxifene (EVISTA) 60 MG tablet, Take 1 tablet (60 mg total) by mouth daily., Disp: 90 tablet, Rfl: 3   rosuvastatin (CRESTOR) 10 MG tablet, TAKE (1/2) TABLET DAILY., Disp: 45 tablet, Rfl: 3   triamterene-hydrochlorothiazide (DYAZIDE) 37.5-25 MG capsule, TAKE (1) CAPSULE DAILY, Disp: 90 capsule, Rfl: 2 Social History    Socioeconomic History   Marital status: Widowed    Spouse name: Not on file   Number of children: Not on file   Years of education: Not on file   Highest education level: Not on file  Occupational History   Not on file  Tobacco Use   Smoking status: Never   Smokeless tobacco: Never  Vaping Use   Vaping Use: Never used  Substance and Sexual Activity   Alcohol use: No   Drug use: No   Sexual activity: Never  Other Topics Concern   Not on file  Social History Narrative   Not on file   Social Determinants of Health   Financial Resource Strain: Not on file  Food Insecurity: Not on file  Transportation Needs: Not on file  Physical Activity: Not on file  Stress: Not on file  Social Connections: Not on file  Intimate Partner Violence: Not on file   Family History  Problem Relation Age of Onset   Heart disease Mother    Hypertension Mother    Cancer Father        lymp nodes   Hypertension Brother     Objective: Office vital signs reviewed. BP 138/74   Pulse 65   Temp 98.2 F (36.8 C) (Temporal)   Resp 20   Ht 5' (1.524 m)   Wt 152 lb (68.9 kg)   SpO2 97%   BMI 29.69 kg/m   Physical Examination:  General: Awake, alert, well nourished, No acute distress Cardio: regular rate and rhythm, S1S2 heard, no murmurs appreciated Pulm: clear to auscultation bilaterally, no wheezes, rhonchi or rales; normal work of breathing on room air Neuro: Approximately 3 mm sized stage I diabetic ulcer noted along the medial aspect of the left great toe.  She has nonpitting edema of the ankles bilaterally.  +2 pedal pulses.  No other evidence of skin breakdown.  Assessment/ Plan: 87 y.o. female   Hypertension associated with diabetes ([place]) - Plan: Renal Function Panel  Type 2 diabetes mellitus with stage 3a chronic kidney disease, without long-term current use of insulin ([place]) - Plan: Bayer DCA Hb A1c Waived, metFORMIN (GLUCOPHAGE) 1000 MG tablet  Diabetic ulcer of toe of left  foot associated with type 2 diabetes mellitus, limited to breakdown of skin ([place]) - Plan: For Home Use Only DME Diabetic Shoe  Blood pressure normal.  No changes.  Check renal function  A1c shows very tightly controlled blood sugar.  Given advanced age, I have advised to reduce metformin to 1000 mg daily only.  Diabetic shoe prescription prescribed given what appears to be now a diabetic ulcer of the left great toe.  Discussed home care instructions, reducing pressure on the toe.  No evidence of secondary infection and it seems to be healing.  No orders of the defined types were placed in this encounter.  No orders of the defined types were placed in this  encounter.    Sara Norlander, DO [place] [phone]

## 2020-12-22 LAB — RENAL FUNCTION PANEL
Albumin: 3.7 g/dL (ref 3.6–4.6)
BUN/Creatinine Ratio: 17 (ref 12–28)
BUN: 17 mg/dL (ref 8–27)
CO2: 23 mmol/L (ref 20–29)
Calcium: 8.1 mg/dL — ABNORMAL LOW (ref 8.7–10.3)
Chloride: 100 mmol/L (ref 96–106)
Creatinine, Ser: 1.03 mg/dL — ABNORMAL HIGH (ref 0.57–1.00)
Glucose: 115 mg/dL — ABNORMAL HIGH (ref 70–99)
Phosphorus: 3.3 mg/dL (ref 3.0–4.3)
Potassium: 4.1 mmol/L (ref 3.5–5.2)
Sodium: 139 mmol/L (ref 134–144)
eGFR: 52 mL/min/{1.73_m2} — ABNORMAL LOW (ref >59)

## 2020-12-22 NOTE — Progress Notes (Signed)
Pt r/c about labs, please call back.

## 2020-12-23 NOTE — Progress Notes (Signed)
Returning nurse call.

## 2021-01-06 ENCOUNTER — Other Ambulatory Visit: Payer: Self-pay

## 2021-01-06 ENCOUNTER — Ambulatory Visit (INDEPENDENT_AMBULATORY_CARE_PROVIDER_SITE_OTHER): Payer: PPO

## 2021-01-06 VITALS — Ht 60.0 in | Wt 145.0 lb

## 2021-01-06 DIAGNOSIS — Z0000 Encounter for general adult medical examination without abnormal findings: Secondary | ICD-10-CM | POA: Diagnosis not present

## 2021-01-06 NOTE — Telephone Encounter (Signed)
She's had the same glucometer for over 5 years - It keeps messing up and she wants to know if she can get new one - Now she uses One Touch Ultra 2 and likes it if she can get same one. Uses [employer]. Tests sugar once daily fasting. Thanks!

## 2021-01-06 NOTE — Patient Instructions (Signed)
Sara Holmes , Thank you for taking time to come for your Medicare Wellness Visit. I appreciate your ongoing commitment to your health goals. Please review the following plan we discussed and let me know if I can assist you in the future.   Screening recommendations/referrals: Colonoscopy: no longer required Mammogram: Done 08/18/2020 - Repeat annually Bone Density: Done 11/08/2016 - Repeat every 2 years  Recommended yearly ophthalmology/optometry visit for glaucoma screening and checkup Recommended yearly dental visit for hygiene and checkup  Vaccinations: Influenza vaccine: Done 12/26/2020 - Repeat annually  Pneumococcal vaccine: Done 03/31/2010 - Maybe due Prevnar? Tdap vaccine: Due. Every 10 years Shingles vaccine: Done 11/07/2016 & 04/01/2017   Covid-19:Done 05/11/2019, 06/08/2019, 01/16/2020, 06/21/2020, & 12/13/2020  Advanced directives: in chart  Conditions/risks identified: Aim for 30 minutes of exercise or brisk walking each day, drink 6-8 glasses of water and eat lots of fruits and vegetables.   Next appointment: Follow up in one year for your annual wellness visit    Preventive Care 65 Years and Older, Female Preventive care refers to lifestyle choices and visits with your health care provider that can promote health and wellness. What does preventive care include? A yearly physical exam. This is also called an annual well check. Dental exams once or twice a year. Routine eye exams. Ask your health care provider how often you should have your eyes checked. Personal lifestyle choices, including: Daily care of your teeth and gums. Regular physical activity. Eating a healthy diet. Avoiding tobacco and drug use. Limiting alcohol use. Practicing safe sex. Taking low-dose aspirin every day. Taking vitamin and mineral supplements as recommended by your health care provider. What happens during an annual well check? The services and screenings done by your health care provider during  your annual well check will depend on your age, overall health, lifestyle risk factors, and family history of disease. Counseling  Your health care provider may ask you questions about your: Alcohol use. Tobacco use. Drug use. Emotional well-being. Home and relationship well-being. Sexual activity. Eating habits. History of falls. Memory and ability to understand (cognition). Work and work Statistician. Reproductive health. Screening  You may have the following tests or measurements: Height, weight, and BMI. Blood pressure. Lipid and cholesterol levels. These may be checked every 5 years, or more frequently if you are over 50 years old. Skin check. Lung cancer screening. You may have this screening every year starting at age 85 if you have a 30-pack-year history of smoking and currently smoke or have quit within the past 15 years. Fecal occult blood test (FOBT) of the stool. You may have this test every year starting at age 85. Flexible sigmoidoscopy or colonoscopy. You may have a sigmoidoscopy every 5 years or a colonoscopy every 10 years starting at age 85. Hepatitis C blood test. Hepatitis B blood test. Sexually transmitted disease (STD) testing. Diabetes screening. This is done by checking your blood sugar (glucose) after you have not eaten for a while (fasting). You may have this done every 1-3 years. Bone density scan. This is done to screen for osteoporosis. You may have this done starting at age 85. Mammogram. This may be done every 1-2 years. Talk to your health care provider about how often you should have regular mammograms. Talk with your health care provider about your test results, treatment options, and if necessary, the need for more tests. Vaccines  Your health care provider may recommend certain vaccines, such as: Influenza vaccine. This is recommended every year. Tetanus, diphtheria,  and acellular pertussis (Tdap, Td) vaccine. You may need a Td booster every 10  years. Zoster vaccine. You may need this after age 85. Pneumococcal 13-valent conjugate (PCV13) vaccine. One dose is recommended after age 85. Pneumococcal polysaccharide (PPSV23) vaccine. One dose is recommended after age 85. Talk to your health care provider about which screenings and vaccines you need and how often you need them. This information is not intended to replace advice given to you by your health care provider. Make sure you discuss any questions you have with your health care provider. Document Released: 03/25/2015 Document Revised: 11/16/2015 Document Reviewed: 12/28/2014 Elsevier Interactive Patient Education  2017 [employer] Prevention in the Home Falls can cause injuries. They can happen to people of all ages. There are many things you can do to make your home safe and to help prevent falls. What can I do on the outside of my home? Regularly fix the edges of walkways and driveways and fix any cracks. Remove anything that might make you trip as you walk through a door, such as a raised step or threshold. Trim any bushes or trees on the path to your home. Use bright outdoor lighting. Clear any walking paths of anything that might make someone trip, such as rocks or tools. Regularly check to see if handrails are loose or broken. Make sure that both sides of any steps have handrails. Any raised decks and porches should have guardrails on the edges. Have any leaves, snow, or ice cleared regularly. Use sand or salt on walking paths during winter. Clean up any spills in your garage right away. This includes oil or grease spills. What can I do in the bathroom? Use night lights. Install grab bars by the toilet and in the tub and shower. Do not use towel bars as grab bars. Use non-skid mats or decals in the tub or shower. If you need to sit down in the shower, use a plastic, non-slip stool. Keep the floor dry. Clean up any water that spills on the floor as soon as it  happens. Remove soap buildup in the tub or shower regularly. Attach bath mats securely with double-sided non-slip rug tape. Do not have throw rugs and other things on the floor that can make you trip. What can I do in the bedroom? Use night lights. Make sure that you have a light by your bed that is easy to reach. Do not use any sheets or blankets that are too big for your bed. They should not hang down onto the floor. Have a firm chair that has side arms. You can use this for support while you get dressed. Do not have throw rugs and other things on the floor that can make you trip. What can I do in the kitchen? Clean up any spills right away. Avoid walking on wet floors. Keep items that you use a lot in easy-to-reach places. If you need to reach something above you, use a strong step stool that has a grab bar. Keep electrical cords out of the way. Do not use floor polish or wax that makes floors slippery. If you must use wax, use non-skid floor wax. Do not have throw rugs and other things on the floor that can make you trip. What can I do with my stairs? Do not leave any items on the stairs. Make sure that there are handrails on both sides of the stairs and use them. Fix handrails that are broken or loose. Make sure  that handrails are as long as the stairways. Check any carpeting to make sure that it is firmly attached to the stairs. Fix any carpet that is loose or worn. Avoid having throw rugs at the top or bottom of the stairs. If you do have throw rugs, attach them to the floor with carpet tape. Make sure that you have a light switch at the top of the stairs and the bottom of the stairs. If you do not have them, ask someone to add them for you. What else can I do to help prevent falls? Wear shoes that: Do not have high heels. Have rubber bottoms. Are comfortable and fit you well. Are closed at the toe. Do not wear sandals. If you use a stepladder: Make sure that it is fully opened.  Do not climb a closed stepladder. Make sure that both sides of the stepladder are locked into place. Ask someone to hold it for you, if possible. Clearly mark and make sure that you can see: Any grab bars or handrails. First and last steps. Where the edge of each step is. Use tools that help you move around (mobility aids) if they are needed. These include: Canes. Walkers. Scooters. Crutches. Turn on the lights when you go into a dark area. Replace any light bulbs as soon as they burn out. Set up your furniture so you have a clear path. Avoid moving your furniture around. If any of your floors are uneven, fix them. If there are any pets around you, be aware of where they are. Review your medicines with your doctor. Some medicines can make you feel dizzy. This can increase your chance of falling. Ask your doctor what other things that you can do to help prevent falls. This information is not intended to replace advice given to you by your health care provider. Make sure you discuss any questions you have with your health care provider. Document Released: 12/23/2008 Document Revised: 08/04/2015 Document Reviewed: 04/02/2014 Elsevier Interactive Patient Education  2017 [employer].

## 2021-01-06 NOTE — Progress Notes (Signed)
Subjective:   Sara Holmes is a 87 y.o. female who presents for Medicare Annual (Subsequent) preventive examination.  Virtual Visit via Telephone Note  I connected with  Sara Holmes on 01/06/21 at  1:15 PM EDT by telephone and verified that I am speaking with the correct person using two identifiers.  Location: Patient: Home Provider: [facility] Persons participating in the virtual visit: patient/Nurse Health Advisor   I discussed the limitations, risks, security and privacy concerns of performing an evaluation and management service by telephone and the availability of in person appointments. The patient expressed understanding and agreed to proceed.  Interactive audio and video telecommunications were attempted between this nurse and patient, however failed, due to patient having technical difficulties OR patient did not have access to video capability.  We continued and completed visit with audio only.  Some vital signs may be absent or patient reported.   Sara Holmes E Neldon Shepard, LPN   Review of Systems     Cardiac Risk Factors include: advanced age (>24men, >23 women);diabetes mellitus;dyslipidemia;sedentary lifestyle;hypertension     Objective:    Today's Vitals   01/06/21 1321  Weight: 145 lb (65.8 kg)  Height: 5' (1.524 m)   Body mass index is 28.32 kg/m.  Advanced Directives 01/06/2021 11/07/2017 09/17/2016 01/01/2014  Does Patient Have a Medical Advance Directive? Yes No Yes No  Type of Paramedic of [place];Living will - Living will -  Copy of [place] in Chart? Yes - validated most recent copy scanned in chart (See row information) - - -  Would patient like information on creating a medical advance directive? - Yes (MAU/Ambulatory/Procedural Areas - Information given) - Yes - Educational materials given    Current Medications (verified) Outpatient Encounter Medications as of 01/06/2021  Medication Sig   allopurinol (ZYLOPRIM)  100 MG tablet TAKE 1 TABLET EVERY DAY   aspirin 81 MG tablet Take 81 mg by mouth daily.   Calcium Carbonate-Vitamin D (CALTRATE 600+D PO) Take 1 tablet by mouth 2 (two) times daily.   Cholecalciferol (VITAMIN D3) 1000 UNITS CAPS Take 1 tablet by mouth daily.   felodipine (PLENDIL) 2.5 MG 24 hr tablet TAKE 1 TABLET BY MOUTH DAILY.   fosinopril (MONOPRIL) 40 MG tablet TAKE 1 TABLET (40 MG TOTAL) BY MOUTH DAILY.   gabapentin (NEURONTIN) 300 MG capsule Take 1 capsule (300 mg total) by mouth at bedtime.   glucose blood (ONETOUCH ULTRA) test strip CHECK BLOOD SUGAR ONCE A DAY Dx E11.9   halobetasol (ULTRAVATE) 0.05 % cream Apply topically 2 (two) times daily.   Lancets (ONETOUCH DELICA PLUS [id number]) MISC CHECK BLOOD SUGAR ONCE DAILY OR AS DIRECTED Dx E11.42   metFORMIN (GLUCOPHAGE) 1000 MG tablet TAKE 1 TABLET in the am  with breakfast   raloxifene (EVISTA) 60 MG tablet Take 1 tablet (60 mg total) by mouth daily.   rosuvastatin (CRESTOR) 10 MG tablet TAKE (1/2) TABLET DAILY.   triamterene-hydrochlorothiazide (DYAZIDE) 37.5-25 MG capsule TAKE (1) CAPSULE DAILY   diclofenac sodium (VOLTAREN) 1 % GEL Apply 4 g topically 4 (four) times daily. (Patient not taking: Reported on 01/06/2021)   fluticasone (CUTIVATE) 0.05 % cream Apply topically 2 (two) times daily. (Patient not taking: Reported on 01/06/2021)   meclizine (ANTIVERT) 25 MG tablet TAKE (1) TABLET THREE TIMES DAILY AS NEEDED DIZZINESS (Patient not taking: Reported on 01/06/2021)   No facility-administered encounter medications on file as of 01/06/2021.    Allergies (verified) Prednisone, Amoxicillin, and Tetanus toxoids  History: Past Medical History:  Diagnosis Date   Diabetes mellitus without complication (HCC)    Diverticulosis    Glaucoma    Gout    Hyperlipidemia    Hypertension    Osteopenia    Plantar fasciitis    Sinus bradycardia    Vertigo    Past Surgical History:  Procedure Laterality Date   ABDOMINAL HYSTERECTOMY      CHOLECYSTECTOMY     EYE SURGERY     cataracts   NASAL SEPTUM SURGERY     Family History  Problem Relation Age of Onset   Heart disease Mother    Hypertension Mother    Cancer Father        lymp nodes   Hypertension Brother    Social History   Socioeconomic History   Marital status: Widowed    Spouse name: Not on file   Number of children: Not on file   Years of education: Not on file   Highest education level: Not on file  Occupational History   Occupation: retired  Tobacco Use   Smoking status: Never   Smokeless tobacco: Never  Vaping Use   Vaping Use: Never used  Substance and Sexual Activity   Alcohol use: No   Drug use: No   Sexual activity: Never  Other Topics Concern   Not on file  Social History Narrative   Lives alone   Close friends and family nearby   Social Determinants of Health   Financial Resource Strain: Low Risk    Difficulty of Paying Living Expenses: Not hard at all  Food Insecurity: No Food Insecurity   Worried About Charity fundraiser in the Last Year: Never true   Arboriculturist in the Last Year: Never true  Transportation Needs: No Transportation Needs   Lack of Transportation (Medical): No   Lack of Transportation (Non-Medical): No  Physical Activity: Insufficiently Active   Days of Exercise per Week: 7 days   Minutes of Exercise per Session: 10 min  Stress: No Stress Concern Present   Feeling of Stress : Not at all  Social Connections: Moderately Integrated   Frequency of Communication with Friends and Family: More than three times a week   Frequency of Social Gatherings with Friends and Family: More than three times a week   Attends Religious Services: More than 4 times per year   Active Member of [employer] or Organizations: Yes   Attends Archivist Meetings: 1 to 4 times per year   Marital Status: Widowed    Tobacco Counseling Counseling given: Not Answered   Clinical Intake:  Pre-visit preparation completed:  Yes  Pain : No/denies pain     BMI - recorded: 28.32 Nutritional Status: BMI 25 -29 Overweight Nutritional Risks: None Diabetes: Yes CBG done?: No Did pt. bring in CBG monitor from home?: No  How often do you need to have someone help you when you read instructions, pamphlets, or other written materials from your doctor or pharmacy?: 1 - Never  Diabetic? Yes Virtual Visit via Telephone Note  I connected with  Sara Holmes on 01/06/21 at  1:15 PM EDT by telephone and verified that I am speaking with the correct person using two identifiers.  Location: Patient: Home Provider: [facility] Persons participating in the virtual visit: patient/Nurse Health Advisor   I discussed the limitations, risks, security and privacy concerns of performing an evaluation and management service by telephone and the availability of in person appointments. The  patient expressed understanding and agreed to proceed.  Interactive audio and video telecommunications were attempted between this nurse and patient, however failed, due to patient having technical difficulties OR patient did not have access to video capability.  We continued and completed visit with audio only.  Some vital signs may be absent or patient reported.   Donavin Audino E Hensley Treat, LPN   Interpreter Needed?: No  Information entered by :: Gershon Shorten, LPN   Activities of Daily Living In your present state of health, do you have any difficulty performing the following activities: 01/06/2021  Hearing? Y  Comment wears hearing aids  Vision? N  Difficulty concentrating or making decisions? N  Walking or climbing stairs? Y  Dressing or bathing? N  Doing errands, shopping? N  Comment gets someone to drive her if out of town  Conservation officer, nature and eating ? N  Using the Toilet? N  In the past six months, have you accidently leaked urine? N  Do you have problems with loss of bowel control? N  Managing your Medications? N  Managing your Finances? N   Housekeeping or managing your Housekeeping? N  Some recent data might be hidden    Patient Care Team: Janora Norlander, DO as PCP - General (Family Medicine) Clent Jacks, MD as Consulting Physician (Ophthalmology)  Indicate any recent Medical Services you may have received from other than [facility] providers in the past year (date may be approximate).     Assessment:   This is a routine wellness examination for [place].  Hearing/Vision screen Hearing Screening - Comments:: Wears hearing aids - from [facility] in [place] - Comments:: Wears reading glasses only prn - up to date with annual eye exams with [facility] in [place]  Dietary issues and exercise activities discussed: Current Exercise Habits: Home exercise routine, Type of exercise: treadmill, Time (Minutes): 20, Frequency (Times/Week): 5, Weekly Exercise (Minutes/Week): 100, Intensity: Mild, Exercise limited by: neurologic condition(s);orthopedic condition(s)   Goals Addressed             This Visit's Progress    Exercise 3x per week (30 min per time)   On track      Depression Screen PHQ 2/9 Scores 01/06/2021 12/21/2020 09/09/2020 03/21/2020 09/18/2019 03/24/2019 09/22/2018  PHQ - 2 Score 0 0 0 0 0 0 0  PHQ- 9 Score 0 0 - - - 0 0    Fall Risk Fall Risk  01/06/2021 12/21/2020 09/09/2020 03/21/2020 09/18/2019  Falls in the past year? 0 0 0 0 1  Number falls in past yr: 0 - - - 0  Injury with Fall? 0 - - - 0  Risk for fall due to : Impaired balance/gait;Orthopedic patient;Medication side effect - - - History of fall(s)  Follow up Falls prevention discussed - - - Falls evaluation completed    FALL RISK PREVENTION PERTAINING TO THE HOME:  Any stairs in or around the home? Yes  If so, are there any without handrails? No  Home free of loose throw rugs in walkways, pet beds, electrical cords, etc? Yes  Adequate lighting in your home to reduce risk of falls? Yes   ASSISTIVE DEVICES UTILIZED TO PREVENT  FALLS:  Life alert? Yes  Use of a cane, walker or w/c? Yes  Grab bars in the bathroom? Yes  Shower chair or bench in shower? No  Elevated toilet seat or a handicapped toilet? Yes   TIMED UP AND GO:  Was the test performed? No . Telephonic visit  Cognitive Function: MMSE - Mini Mental State Exam 11/07/2017 09/17/2016  Orientation to time 5 5  Orientation to Place 5 5  Registration 3 3  Attention/ Calculation 5 5  Recall 2 2  Language- name 2 objects 2 2  Language- repeat 1 1  Language- follow 3 step command 3 3  Language- read & follow direction 1 1  Write a sentence 1 1  Copy design 1 1  Total score 29 29     6CIT Screen 01/06/2021  What Year? 0 points  What month? 0 points  What time? 0 points  Count back from 20 0 points  Months in reverse 0 points  Repeat phrase 0 points  Total Score 0    Immunizations Immunization History  Administered Date(s) Administered   Fluad Quad(high Dose 65+) 12/14/2019, 12/26/2020   Influenza Whole 12/28/2008   Influenza, High Dose Seasonal PF 12/11/2016, 12/20/2017   Influenza,inj,Quad PF,6+ Mos 12/03/2012, 01/01/2014, 12/31/2014, 11/28/2015   Influenza,inj,quad, With Preservative 12/03/2018   Influenza-Unspecified 12/04/2018   Moderna Covid-19 Vaccine Bivalent Booster 89yrs & up 12/13/2020   Moderna Sars-Covid-2 Vaccination 05/11/2019, 06/08/2019, 01/26/2020, 06/21/2020   Pneumococcal Polysaccharide-23 03/31/2010   Zoster Recombinat (Shingrix) 11/07/2016, 04/01/2017   Zoster, Live 01/28/2007    TDAP status: Due, Education has been provided regarding the importance of this vaccine. Advised may receive this vaccine at local pharmacy or Health Dept. Aware to provide a copy of the vaccination record if obtained from local pharmacy or Health Dept. Verbalized acceptance and understanding.  Flu Vaccine status: Up to date  Pneumococcal vaccine status: Up to date  Covid-19 vaccine status: Completed vaccines  Qualifies for Shingles  Vaccine? Yes   Zostavax completed Yes   Shingrix Completed?: Yes  Screening Tests Health Maintenance  Topic Date Due   Pneumonia Vaccine 97+ Years old (2 - PCV) 04/01/2011   FOOT EXAM  03/21/2021   OPHTHALMOLOGY EXAM  05/19/2021   HEMOGLOBIN A1C  06/21/2021   MAMMOGRAM  08/18/2021   INFLUENZA VACCINE  Completed   DEXA SCAN  Completed   COVID-19 Vaccine  Completed   Zoster Vaccines- Shingrix  Completed   HPV VACCINES  Aged Out    Health Maintenance  Health Maintenance Due  Topic Date Due   Pneumonia Vaccine 30+ Years old (2 - PCV) 04/01/2011    Colorectal cancer screening: No longer required.   Mammogram status: Completed 08/18/2020. Repeat every year  Bone Density status: Completed 11/08/2016. Results reflect: Bone density results: OSTEOPENIA. Repeat every 2 years.  Lung Cancer Screening: (Low Dose CT Chest recommended if Age 72-80 years, 30 pack-year currently smoking OR have quit w/in 15years.) does not qualify.   Additional Screening:  Hepatitis C Screening: does not qualify  Vision Screening: Recommended annual ophthalmology exams for early detection of glaucoma and other disorders of the eye. Is the patient up to date with their annual eye exam?  Yes  Who is the provider or what is the name of the office in which the patient attends annual eye exams? [facility] If pt is not established with a provider, would they like to be referred to a provider to establish care? No .   Dental Screening: Recommended annual dental exams for proper oral hygiene  Community Resource Referral / Chronic Care Management: CRR required this visit?  No   CCM required this visit?  No      Plan:     I have personally reviewed and noted the following in the patient's chart:   Medical and  social history Use of alcohol, tobacco or illicit drugs  Current medications and supplements including opioid prescriptions.  Functional ability and status Nutritional status Physical  activity Advanced directives List of other physicians Hospitalizations, surgeries, and ER visits in previous 12 months Vitals Screenings to include cognitive, depression, and falls Referrals and appointments  In addition, I have reviewed and discussed with patient certain preventive protocols, quality metrics, and best practice recommendations. A written personalized care plan for preventive services as well as general preventive health recommendations were provided to patient.     Sandrea Hammond, LPN   [id number]/[id number]   Nurse Notes: None

## 2021-01-08 MED ORDER — BLOOD GLUCOSE METER KIT: 1.0000 | PACK | 0 refills | Status: DC

## 2021-01-10 DIAGNOSIS — L84 Corns and callosities: Secondary | ICD-10-CM | POA: Diagnosis not present

## 2021-01-10 DIAGNOSIS — M79676 Pain in unspecified toe(s): Secondary | ICD-10-CM | POA: Diagnosis not present

## 2021-01-10 DIAGNOSIS — B351 Tinea unguium: Secondary | ICD-10-CM | POA: Diagnosis not present

## 2021-01-10 DIAGNOSIS — E1142 Type 2 diabetes mellitus with diabetic polyneuropathy: Secondary | ICD-10-CM | POA: Diagnosis not present

## 2021-03-14 ENCOUNTER — Ambulatory Visit (INDEPENDENT_AMBULATORY_CARE_PROVIDER_SITE_OTHER): Payer: PPO | Admitting: Family Medicine

## 2021-03-14 ENCOUNTER — Encounter: Payer: Self-pay | Admitting: Family Medicine

## 2021-03-14 VITALS — BP 148/87 | HR 79 | Temp 97.6°F | Ht 60.0 in | Wt 149.6 lb

## 2021-03-14 DIAGNOSIS — E785 Hyperlipidemia, unspecified: Secondary | ICD-10-CM | POA: Diagnosis not present

## 2021-03-14 DIAGNOSIS — E1142 Type 2 diabetes mellitus with diabetic polyneuropathy: Secondary | ICD-10-CM | POA: Diagnosis not present

## 2021-03-14 DIAGNOSIS — E1169 Type 2 diabetes mellitus with other specified complication: Secondary | ICD-10-CM | POA: Diagnosis not present

## 2021-03-14 DIAGNOSIS — E1122 Type 2 diabetes mellitus with diabetic chronic kidney disease: Secondary | ICD-10-CM | POA: Diagnosis not present

## 2021-03-14 DIAGNOSIS — N1831 Chronic kidney disease, stage 3a: Secondary | ICD-10-CM

## 2021-03-14 LAB — BAYER DCA HB A1C WAIVED: HB A1C (BAYER DCA - WAIVED): 6.2 % — ABNORMAL HIGH (ref 4.8–5.6)

## 2021-03-14 MED ORDER — METFORMIN HCL 1000 MG PO TABS: 500.0000 mg | ORAL_TABLET | Freq: Every day | ORAL | 3 refills | Status: DC

## 2021-03-14 NOTE — Progress Notes (Signed)
Subjective: CC:DM PCP: Sara Norlander, DO VOZ:DGUYQ Sara Holmes is a 88 y.o. female presenting to clinic today for:  1. Type 2 Diabetes with hypertension, hyperlipidemia and CKD3:  She reports compliance with 1000 mg of metformin daily, Monopril 40 mg daily, Plendil 2.5 mg daily and Crestor 5 milligrams daily.  She has been having some difficulty with her new meter  Last eye exam: UTD Last foot exam: UTD Last A1c:  Lab Results  Component Value Date   HGBA1C 6.2 (H) 12/21/2020   Nephropathy screen indicated?: on ACE-I Last flu, zoster and/or pneumovax: PNA Immunization History  Administered Date(s) Administered   Fluad Quad(high Dose 65+) 12/14/2019, 12/26/2020   Influenza Whole 12/28/2008   Influenza, High Dose Seasonal PF 12/11/2016, 12/20/2017   Influenza,inj,Quad PF,6+ Mos 12/03/2012, 01/01/2014, 12/31/2014, 11/28/2015   Influenza,inj,quad, With Preservative 12/03/2018   Influenza-Unspecified 12/04/2018   Moderna Covid-19 Vaccine Bivalent Booster 53yr & up 12/13/2020   Moderna Sars-Covid-2 Vaccination 05/11/2019, 06/08/2019, 01/26/2020, 06/21/2020   Pneumococcal Polysaccharide-23 03/31/2010   Zoster Recombinat (Shingrix) 11/07/2016, 04/01/2017   Zoster, Live 01/28/2007    ROS: No chest pain, shortness of breath, visual disturbance or falls  ROS: Per HPI  Allergies  Allergen Reactions   Prednisone Other (See Comments)    Elevated blood sugar   Amoxicillin Itching and Rash   Tetanus Toxoids Rash   Past Medical History:  Diagnosis Date   Diabetes mellitus without complication (HCC)    Diverticulosis    Glaucoma    Gout    Hyperlipidemia    Hypertension    Osteopenia    Plantar fasciitis    Sinus bradycardia    Vertigo     Current Outpatient Medications:    allopurinol (ZYLOPRIM) 100 MG tablet, TAKE 1 TABLET EVERY DAY, Disp: 90 tablet, Rfl: 1   aspirin 81 MG tablet, Take 81 mg by mouth daily., Disp: , Rfl:    blood glucose meter kit and supplies, 1  each by Other route as directed. Dispense based on patient and insurance preference. Use once per day fasting. (FOR ICD-10 E10.9, E11.9)., Disp: 1 each, Rfl: 0   Calcium Carbonate-Vitamin D (CALTRATE 600+D PO), Take 1 tablet by mouth 2 (two) times daily., Disp: , Rfl:    Cholecalciferol (VITAMIN D3) 1000 UNITS CAPS, Take 1 tablet by mouth daily., Disp: , Rfl:    felodipine (PLENDIL) 2.5 MG 24 hr tablet, TAKE 1 TABLET BY MOUTH DAILY., Disp: 90 tablet, Rfl: 3   fosinopril (MONOPRIL) 40 MG tablet, TAKE 1 TABLET (40 MG TOTAL) BY MOUTH DAILY., Disp: 90 tablet, Rfl: 3   gabapentin (NEURONTIN) 300 MG capsule, Take 1 capsule (300 mg total) by mouth at bedtime., Disp: 90 capsule, Rfl: 2   glucose blood (ONETOUCH ULTRA) test strip, CHECK BLOOD SUGAR ONCE A DAY Dx E11.9, Disp: 100 strip, Rfl: 3   halobetasol (ULTRAVATE) 0.05 % cream, Apply topically 2 (two) times daily., Disp: , Rfl:    Lancets (ONETOUCH DELICA PLUS [id number] MISC, CHECK BLOOD SUGAR ONCE DAILY OR AS DIRECTED Dx E11.42, Disp: 100 each, Rfl: 3   metFORMIN (GLUCOPHAGE) 1000 MG tablet, TAKE 1 TABLET in the am  with breakfast, Disp: 90 tablet, Rfl: 3   raloxifene (EVISTA) 60 MG tablet, Take 1 tablet (60 mg total) by mouth daily., Disp: 90 tablet, Rfl: 3   rosuvastatin (CRESTOR) 10 MG tablet, TAKE (1/2) TABLET DAILY., Disp: 45 tablet, Rfl: 3   triamterene-hydrochlorothiazide (DYAZIDE) 37.5-25 MG capsule, TAKE (1) CAPSULE DAILY, Disp: 90 capsule, Rfl: 2  diclofenac sodium (VOLTAREN) 1 % GEL, Apply 4 g topically 4 (four) times daily. (Patient not taking: Reported on 01/06/2021), Disp: 100 g, Rfl: 3   fluticasone (CUTIVATE) 0.05 % cream, Apply topically 2 (two) times daily. (Patient not taking: Reported on 01/06/2021), Disp: 30 g, Rfl: 2   meclizine (ANTIVERT) 25 MG tablet, TAKE (1) TABLET THREE TIMES DAILY AS NEEDED DIZZINESS (Patient not taking: Reported on 01/06/2021), Disp: 30 tablet, Rfl: 0 Social History   Socioeconomic History   Marital  status: Widowed    Spouse name: Not on file   Number of children: Not on file   Years of education: Not on file   Highest education level: Not on file  Occupational History   Occupation: retired  Tobacco Use   Smoking status: Never   Smokeless tobacco: Never  Vaping Use   Vaping Use: Never used  Substance and Sexual Activity   Alcohol use: No   Drug use: No   Sexual activity: Never  Other Topics Concern   Not on file  Social History Narrative   Lives alone   Close friends and family nearby   Social Determinants of Health   Financial Resource Strain: Low Risk    Difficulty of Paying Living Expenses: Not hard at all  Food Insecurity: No Food Insecurity   Worried About Charity fundraiser in the Last Year: Never true   Arboriculturist in the Last Year: Never true  Transportation Needs: No Transportation Needs   Lack of Transportation (Medical): No   Lack of Transportation (Non-Medical): No  Physical Activity: Insufficiently Active   Days of Exercise per Week: 7 days   Minutes of Exercise per Session: 10 min  Stress: No Stress Concern Present   Feeling of Stress : Not at all  Social Connections: Moderately Integrated   Frequency of Communication with Friends and Family: More than three times a week   Frequency of Social Gatherings with Friends and Family: More than three times a week   Attends Religious Services: More than 4 times per year   Active Member of [employer] or Organizations: Yes   Attends Archivist Meetings: 1 to 4 times per year   Marital Status: Widowed  Human resources officer Violence: Not At Risk   Fear of Current or Ex-Partner: No   Emotionally Abused: No   Physically Abused: No   Sexually Abused: No   Family History  Problem Relation Age of Onset   Heart disease Mother    Hypertension Mother    Cancer Father        lymp nodes   Hypertension Brother     Objective: Office vital signs reviewed. BP (!) 148/87    Pulse 79    Temp 97.6 F (36.4 C)     Ht 5' (1.524 m)    Wt 149 lb 9.6 oz (67.9 kg)    SpO2 94%    BMI 29.22 kg/m   Physical Examination:  General: Awake, alert, well nourished, No acute distress HEENT: Sclera white.  Moist mucous membranes Cardio: regular rate and rhythm, S1S2 heard, no murmurs appreciated Pulm: clear to auscultation bilaterally, no wheezes, rhonchi or rales; normal work of breathing on room air MSK: Ambulating independently  Assessment/ Plan: 88 y.o. female   Type 2 diabetes mellitus with stage 3a chronic kidney disease, without long-term current use of insulin ([place]) - Plan: Bayer DCA Hb A1c Waived, Renal function panel, metFORMIN (GLUCOPHAGE) 1000 MG tablet, CANCELED: CMP14+EGFR  Hyperlipidemia associated with  type 2 diabetes mellitus (HCC)  Diabetic polyneuropathy associated with type 2 diabetes mellitus ([place])  Blood sugar remains under excellent control but I think a little too tightly controlled for her advanced age.  Reduce to 1/2 tablet daily.  We will reconvene in 3 months.  If persistently less than 7 we will discontinue metformin totally.  She was amenable to this plan.  Check renal function given CKD 3  Lipid not due for recheck  Neuropathy stable.  Did not need gabapentin renewal  Orders Placed This Encounter  Procedures   Bayer DCA Hb A1c Waived   Renal function panel   No orders of the defined types were placed in this encounter.    Sara Norlander, DO [place] [phone]

## 2021-03-15 LAB — RENAL FUNCTION PANEL
Albumin: 4.3 g/dL (ref 3.5–4.6)
BUN/Creatinine Ratio: 14 (ref 12–28)
BUN: 16 mg/dL (ref 10–36)
CO2: 26 mmol/L (ref 20–29)
Calcium: 9.4 mg/dL (ref 8.7–10.3)
Chloride: 101 mmol/L (ref 96–106)
Creatinine, Ser: 1.16 mg/dL — ABNORMAL HIGH (ref 0.57–1.00)
Glucose: 137 mg/dL — ABNORMAL HIGH (ref 70–99)
Phosphorus: 3.6 mg/dL (ref 3.0–4.3)
Potassium: 3.8 mmol/L (ref 3.5–5.2)
Sodium: 142 mmol/L (ref 134–144)
eGFR: 45 mL/min/{1.73_m2} — ABNORMAL LOW (ref >59)

## 2021-03-21 DIAGNOSIS — M79676 Pain in unspecified toe(s): Secondary | ICD-10-CM | POA: Diagnosis not present

## 2021-03-21 DIAGNOSIS — L84 Corns and callosities: Secondary | ICD-10-CM | POA: Diagnosis not present

## 2021-03-21 DIAGNOSIS — B351 Tinea unguium: Secondary | ICD-10-CM | POA: Diagnosis not present

## 2021-03-21 DIAGNOSIS — E1142 Type 2 diabetes mellitus with diabetic polyneuropathy: Secondary | ICD-10-CM | POA: Diagnosis not present

## 2021-04-17 ENCOUNTER — Other Ambulatory Visit: Payer: Self-pay | Admitting: Family Medicine

## 2021-04-22 ENCOUNTER — Other Ambulatory Visit: Payer: Self-pay | Admitting: Family Medicine

## 2021-05-23 DIAGNOSIS — E119 Type 2 diabetes mellitus without complications: Secondary | ICD-10-CM | POA: Diagnosis not present

## 2021-05-23 DIAGNOSIS — H40013 Open angle with borderline findings, low risk, bilateral: Secondary | ICD-10-CM | POA: Diagnosis not present

## 2021-05-23 DIAGNOSIS — Z961 Presence of intraocular lens: Secondary | ICD-10-CM | POA: Diagnosis not present

## 2021-05-23 LAB — HM DIABETES EYE EXAM

## 2021-05-30 DIAGNOSIS — M79676 Pain in unspecified toe(s): Secondary | ICD-10-CM | POA: Diagnosis not present

## 2021-05-30 DIAGNOSIS — E1142 Type 2 diabetes mellitus with diabetic polyneuropathy: Secondary | ICD-10-CM | POA: Diagnosis not present

## 2021-05-30 DIAGNOSIS — B351 Tinea unguium: Secondary | ICD-10-CM | POA: Diagnosis not present

## 2021-05-30 DIAGNOSIS — L84 Corns and callosities: Secondary | ICD-10-CM | POA: Diagnosis not present

## 2021-06-14 ENCOUNTER — Encounter: Payer: Self-pay | Admitting: Family Medicine

## 2021-06-14 ENCOUNTER — Ambulatory Visit (INDEPENDENT_AMBULATORY_CARE_PROVIDER_SITE_OTHER): Payer: PPO | Admitting: Family Medicine

## 2021-06-14 VITALS — BP 147/69 | HR 79 | Temp 97.7°F | Ht 60.0 in | Wt 155.0 lb

## 2021-06-14 DIAGNOSIS — N183 Chronic kidney disease, stage 3 unspecified: Secondary | ICD-10-CM | POA: Diagnosis not present

## 2021-06-14 DIAGNOSIS — N1831 Chronic kidney disease, stage 3a: Secondary | ICD-10-CM

## 2021-06-14 DIAGNOSIS — E1122 Type 2 diabetes mellitus with diabetic chronic kidney disease: Secondary | ICD-10-CM

## 2021-06-14 DIAGNOSIS — I152 Hypertension secondary to endocrine disorders: Secondary | ICD-10-CM

## 2021-06-14 DIAGNOSIS — E1159 Type 2 diabetes mellitus with other circulatory complications: Secondary | ICD-10-CM

## 2021-06-14 DIAGNOSIS — E1142 Type 2 diabetes mellitus with diabetic polyneuropathy: Secondary | ICD-10-CM

## 2021-06-14 LAB — BAYER DCA HB A1C WAIVED: HB A1C (BAYER DCA - WAIVED): 6.2 % — ABNORMAL HIGH (ref 4.8–5.6)

## 2021-06-14 NOTE — Progress Notes (Signed)
? ?Subjective: ?CC: Diabetes ?PCP: Janora Norlander, DO ?ITG:PQDIY Sara Holmes is a 88 y.o. female presenting to clinic today for: ? ?1. Type 2 Diabetes with hypertension, hyperlipidemia:  ?Patient is compliant with her metformin 500 mg daily, Crestor 5 mg daily, Dyazide, Monopril, Plendil.  She feels that she has been doing relatively well.  Blood sugars have been running about 120s with an occasional 140 as her high.  No hypoglycemic episodes reported ? ?Last eye exam: Up-to-date ?Last foot exam: Needs ?Last A1c:  ?Lab Results  ?Component Value Date  ? HGBA1C 6.2 (H) 06/14/2021  ? ?Nephropathy screen indicated?:  On ACE inhibitor ?Last flu, zoster and/or pneumovax:  ?Immunization History  ?Administered Date(s) Administered  ? Fluad Quad(high Dose 65+) 12/14/2019, 12/26/2020  ? Influenza Whole 12/28/2008  ? Influenza, High Dose Seasonal PF 12/11/2016, 12/20/2017  ? Influenza,inj,Quad PF,6+ Mos 12/03/2012, 01/01/2014, 12/31/2014, 11/28/2015  ? Influenza,inj,quad, With Preservative 12/03/2018  ? Influenza-Unspecified 12/04/2018  ? Moderna Covid-19 Vaccine Bivalent Booster 37yr & up 12/13/2020  ? Moderna Sars-Covid-2 Vaccination 05/11/2019, 06/08/2019, 01/26/2020, 06/21/2020  ? Pneumococcal Polysaccharide-23 03/31/2010  ? Zoster Recombinat (Shingrix) 11/07/2016, 04/01/2017  ? Zoster, Live 01/28/2007  ? ? ?ROS: No chest pain, shortness of breath, visual disturbances reported ? ? ? ?ROS: Per HPI ? ?Allergies  ?Allergen Reactions  ? Prednisone Other (See Comments)  ?  Elevated blood sugar  ? Amoxicillin Itching and Rash  ? Tetanus Toxoids Rash  ? ?Past Medical History:  ?Diagnosis Date  ? Diabetes mellitus without complication ([place]   ? Diverticulosis   ? Glaucoma   ? Gout   ? Hyperlipidemia   ? Hypertension   ? Osteopenia   ? Plantar fasciitis   ? Sinus bradycardia   ? Vertigo   ? ? ?Current Outpatient Medications:  ?  allopurinol (ZYLOPRIM) 100 MG tablet, TAKE 1 TABLET EVERY DAY, Disp: 90 tablet, Rfl: 0 ?  aspirin 81  MG tablet, Take 81 mg by mouth daily., Disp: , Rfl:  ?  blood glucose meter kit and supplies, 1 each by Other route as directed. Dispense based on patient and insurance preference. Use once per day fasting. (FOR ICD-10 E10.9, E11.9)., Disp: 1 each, Rfl: 0 ?  Calcium Carbonate-Vitamin D (CALTRATE 600+D PO), Take 1 tablet by mouth 2 (two) times daily., Disp: , Rfl:  ?  Cholecalciferol (VITAMIN D3) 1000 UNITS CAPS, Take 1 tablet by mouth daily., Disp: , Rfl:  ?  felodipine (PLENDIL) 2.5 MG 24 hr tablet, TAKE 1 TABLET BY MOUTH DAILY., Disp: 90 tablet, Rfl: 3 ?  fosinopril (MONOPRIL) 40 MG tablet, TAKE 1 TABLET (40 MG TOTAL) BY MOUTH DAILY., Disp: 90 tablet, Rfl: 3 ?  gabapentin (NEURONTIN) 300 MG capsule, Take 1 capsule (300 mg total) by mouth at bedtime., Disp: 90 capsule, Rfl: 2 ?  glucose blood (ONETOUCH ULTRA) test strip, CHECK BLOOD SUGAR ONCE A DAY Dx E11.9, Disp: 100 strip, Rfl: 3 ?  halobetasol (ULTRAVATE) 0.05 % cream, Apply topically 2 (two) times daily., Disp: , Rfl:  ?  Lancets (ONETOUCH DELICA PLUS [id number] MISC, CHECK BLOOD SUGAR ONCE DAILY OR AS DIRECTED Dx E11.42, Disp: 100 each, Rfl: 3 ?  metFORMIN (GLUCOPHAGE) 1000 MG tablet, Take 0.5 tablets (500 mg total) by mouth daily with breakfast. *Dose change, Disp: 45 tablet, Rfl: 3 ?  raloxifene (EVISTA) 60 MG tablet, Take 1 tablet (60 mg total) by mouth daily., Disp: 90 tablet, Rfl: 3 ?  rosuvastatin (CRESTOR) 10 MG tablet, TAKE (1/2) TABLET DAILY., Disp: 45  tablet, Rfl: 3 ?  triamterene-hydrochlorothiazide (DYAZIDE) 37.5-25 MG capsule, TAKE (1) CAPSULE DAILY, Disp: 90 capsule, Rfl: 2 ?  diclofenac sodium (VOLTAREN) 1 % GEL, Apply 4 g topically 4 (four) times daily. (Patient not taking: Reported on 01/06/2021), Disp: 100 g, Rfl: 3 ?  meclizine (ANTIVERT) 25 MG tablet, TAKE (1) TABLET THREE TIMES DAILY AS NEEDED DIZZINESS (Patient not taking: Reported on 01/06/2021), Disp: 30 tablet, Rfl: 0 ?Social History  ? ?Socioeconomic History  ? Marital status:  Widowed  ?  Spouse name: Not on file  ? Number of children: Not on file  ? Years of education: Not on file  ? Highest education level: Not on file  ?Occupational History  ? Occupation: retired  ?Tobacco Use  ? Smoking status: Never  ? Smokeless tobacco: Never  ?Vaping Use  ? Vaping Use: Never used  ?Substance and Sexual Activity  ? Alcohol use: No  ? Drug use: No  ? Sexual activity: Never  ?Other Topics Concern  ? Not on file  ?Social History Narrative  ? Lives alone  ? Close friends and family nearby  ? ?Social Determinants of Health  ? ?Financial Resource Strain: Low Risk   ? Difficulty of Paying Living Expenses: Not hard at all  ?Food Insecurity: No Food Insecurity  ? Worried About Charity fundraiser in the Last Year: Never true  ? Ran Out of Food in the Last Year: Never true  ?Transportation Needs: No Transportation Needs  ? Lack of Transportation (Medical): No  ? Lack of Transportation (Non-Medical): No  ?Physical Activity: Insufficiently Active  ? Days of Exercise per Week: 7 days  ? Minutes of Exercise per Session: 10 min  ?Stress: No Stress Concern Present  ? Feeling of Stress : Not at all  ?Social Connections: Moderately Integrated  ? Frequency of Communication with Friends and Family: More than three times a week  ? Frequency of Social Gatherings with Friends and Family: More than three times a week  ? Attends Religious Services: More than 4 times per year  ? Active Member of Clubs or Organizations: Yes  ? Attends Archivist Meetings: 1 to 4 times per year  ? Marital Status: Widowed  ?Intimate Partner Violence: Not At Risk  ? Fear of Current or Ex-Partner: No  ? Emotionally Abused: No  ? Physically Abused: No  ? Sexually Abused: No  ? ?Family History  ?Problem Relation Age of Onset  ? Heart disease Mother   ? Hypertension Mother   ? Cancer Father   ?     lymp nodes  ? Hypertension Brother   ? ? ?Objective: ?Office vital signs reviewed. ?BP (!) 147/69 Comment: home bp  Pulse 79   Temp 97.7 ?F  (36.5 ?C)   Ht 5' (1.524 m)   Wt 155 lb (70.3 kg)   SpO2 96%   BMI 30.27 kg/m?  ? ?Physical Examination:  ?General: Awake, alert, well nourished, No acute distress ?HEENT: Sclera white.  Moist mucous membranes ?Cardio: regular rate and rhythm, S1S2 heard, no murmurs appreciated ?Pulm: clear to auscultation bilaterally, no wheezes, rhonchi or rales; normal work of breathing on room air ?Neuro: See diabetic foot ? ?Diabetic Foot Exam - Simple   ?Simple Foot Form ?Diabetic Foot exam was performed with the following findings: Yes 06/14/2021  5:15 PM  ?Visual Inspection ?See comments: Yes ?Sensation Testing ?Intact to touch and monofilament testing bilaterally: Yes ?Pulse Check ?Posterior Tibialis and Dorsalis pulse intact bilaterally: Yes ?Comments ?Hallucis valgus  deformity noted bilaterally but this is mild and nontender on exam.  Monofilament sensation is present ?  ? ? ?Assessment/ Plan: ?88 y.o. female  ? ?Type 2 diabetes mellitus with stage 3a chronic kidney disease, without long-term current use of insulin ([place]) - Plan: Bayer DCA Hb A1c Waived, CANCELED: CMP14+EGFR ? ?CKD stage 3 due to type 2 diabetes mellitus ([place]) - Plan: Renal function panel ? ?Diabetic polyneuropathy associated with type 2 diabetes mellitus ([place]) ? ?Hypertension associated with diabetes ([place]) ? ?Sugar remains under excellent control.  No changes.  Renal function panel ordered given history of CKD ? ?Foot exam was performed today and demonstrated no evidence of ulcerations.  Sensation was excellent ? ?Blood pressure was uncontrolled upon arrival but her home blood pressure was within acceptable range for her age.  Continue to monitor blood pressures closely ? ?Orders Placed This Encounter  ?Procedures  ? Bayer DCA Hb A1c Waived  ? Renal function panel  ? ?No orders of the defined types were placed in this encounter. ? ? ? ?Janora Norlander, DO ?[place] ?[phone] ? ? ?

## 2021-06-15 LAB — RENAL FUNCTION PANEL
Albumin: 4.2 g/dL (ref 3.5–4.6)
BUN/Creatinine Ratio: 14 (ref 12–28)
BUN: 16 mg/dL (ref 10–36)
CO2: 22 mmol/L (ref 20–29)
Calcium: 9.6 mg/dL (ref 8.7–10.3)
Chloride: 99 mmol/L (ref 96–106)
Creatinine, Ser: 1.12 mg/dL — ABNORMAL HIGH (ref 0.57–1.00)
Glucose: 142 mg/dL — ABNORMAL HIGH (ref 70–99)
Phosphorus: 3.8 mg/dL (ref 3.0–4.3)
Potassium: 4 mmol/L (ref 3.5–5.2)
Sodium: 141 mmol/L (ref 134–144)
eGFR: 47 mL/min/{1.73_m2} — ABNORMAL LOW (ref >59)

## 2021-07-03 ENCOUNTER — Ambulatory Visit (INDEPENDENT_AMBULATORY_CARE_PROVIDER_SITE_OTHER): Payer: PPO | Admitting: Family Medicine

## 2021-07-03 ENCOUNTER — Encounter: Payer: Self-pay | Admitting: Family Medicine

## 2021-07-03 VITALS — BP 139/83 | HR 102 | Temp 97.1°F | Ht 61.0 in | Wt 154.6 lb

## 2021-07-03 DIAGNOSIS — M533 Sacrococcygeal disorders, not elsewhere classified: Secondary | ICD-10-CM | POA: Diagnosis not present

## 2021-07-03 MED ORDER — METHYLPREDNISOLONE ACETATE 80 MG/ML IJ SUSP
80.0000 mg | Freq: Once | INTRAMUSCULAR | Status: AC
  Administered 2021-07-03: 80 mg via INTRAMUSCULAR

## 2021-07-03 NOTE — Progress Notes (Signed)
? ?BP 139/83   Pulse (!) 102   Temp (!) 97.1 ?F (36.2 ?C) (Temporal)   Ht '5\' 1"'  (1.549 m)   Wt 154 lb 9.6 oz (70.1 kg)   SpO2 97%   BMI 29.21 kg/m?   ? ?Subjective:  ? ?Patient ID: Sara Holmes, female    DOB: 1928/11/28, 88 y.o.   MRN: [id number] ? ?HPI: ?Sara Holmes is a 88 y.o. female presenting on 07/03/2021 for Hip Pain (Left hip pain after picking up a flower pot x 1 week ) ? ? ?HPI ?Left hip pain ?Patient is coming in with complaints of left hip pain although the pain is on the back of her hip and its been going on for about 6 days.  She says she started moving his heavy flower pot from her room to in front of the window so he can get some light and she had to bend down and put on the floor and that has what started to cause her this irritation.  She says the plan is doing well but the pain has gotten worse.  She has tried heat and that did not help and she tried cold and that did help some and then she tried some Biofreeze and that also helped some.  She denies any numbness or weakness or fevers or chills.  She says it hurts when she moves or turns in certain directions and it does not seem to be getting better. ? ?Relevant past medical, surgical, family and social history reviewed and updated as indicated. Interim medical history since our last visit reviewed. ?Allergies and medications reviewed and updated. ? ?Review of Systems  ?Constitutional:  Negative for chills and fever.  ?HENT:  Negative for ear discharge.   ?Eyes:  Negative for visual disturbance.  ?Respiratory:  Negative for chest tightness and shortness of breath.   ?Cardiovascular:  Negative for chest pain and leg swelling.  ?Musculoskeletal:  Positive for arthralgias, back pain and myalgias. Negative for gait problem.  ?Skin:  Negative for color change and rash.  ?Neurological:  Negative for light-headedness and headaches.  ?Psychiatric/Behavioral:  Negative for agitation and behavioral problems.   ?All other systems reviewed and are  negative. ? ?Per HPI unless specifically indicated above ? ? ?Allergies as of 07/03/2021   ? ?   Reactions  ? Prednisone Other (See Comments)  ? Elevated blood sugar  ? Amoxicillin Itching, Rash  ? Tetanus Toxoids Rash  ? ?  ? ?  ?Medication List  ?  ? ?  ? Accurate as of July 03, 2021 12:13 PM. If you have any questions, ask your nurse or doctor.  ?  ?  ? ?  ? ?allopurinol 100 MG tablet ?Commonly known as: ZYLOPRIM ?TAKE 1 TABLET EVERY DAY ?  ?aspirin 81 MG tablet ?Take 81 mg by mouth daily. ?  ?blood glucose meter kit and supplies ?1 each by Other route as directed. Dispense based on patient and insurance preference. Use once per day fasting. (FOR ICD-10 E10.9, E11.9). ?  ?CALTRATE 600+D PO ?Take 1 tablet by mouth 2 (two) times daily. ?  ?diclofenac sodium 1 % Gel ?Commonly known as: VOLTAREN ?Apply 4 g topically 4 (four) times daily. ?  ?felodipine 2.5 MG 24 hr tablet ?Commonly known as: PLENDIL ?TAKE 1 TABLET BY MOUTH DAILY. ?  ?fosinopril 40 MG tablet ?Commonly known as: MONOPRIL ?TAKE 1 TABLET (40 MG TOTAL) BY MOUTH DAILY. ?  ?gabapentin 300 MG capsule ?Commonly known as: NEURONTIN ?Take  1 capsule (300 mg total) by mouth at bedtime. ?  ?halobetasol 0.05 % cream ?Commonly known as: ULTRAVATE ?Apply topically 2 (two) times daily. ?  ?meclizine 25 MG tablet ?Commonly known as: ANTIVERT ?TAKE (1) TABLET THREE TIMES DAILY AS NEEDED DIZZINESS ?  ?metFORMIN 1000 MG tablet ?Commonly known as: GLUCOPHAGE ?Take 0.5 tablets (500 mg total) by mouth daily with breakfast. *Dose change ?  ?OneTouch Delica Plus [id number] Misc ?CHECK BLOOD SUGAR ONCE DAILY OR AS DIRECTED Dx E11.42 ?  ?OneTouch Ultra test strip ?Generic drug: glucose blood ?CHECK BLOOD SUGAR ONCE A DAY Dx E11.9 ?  ?raloxifene 60 MG tablet ?Commonly known as: EVISTA ?Take 1 tablet (60 mg total) by mouth daily. ?  ?rosuvastatin 10 MG tablet ?Commonly known as: CRESTOR ?TAKE (1/2) TABLET DAILY. ?  ?triamterene-hydrochlorothiazide 37.5-25 MG capsule ?Commonly known  as: DYAZIDE ?TAKE (1) CAPSULE DAILY ?  ?Vitamin D3 25 MCG (1000 UT) Caps ?Take 1 tablet by mouth daily. ?  ? ?  ? ? ? ?Objective:  ? ?BP 139/83   Pulse (!) 102   Temp (!) 97.1 ?F (36.2 ?C) (Temporal)   Ht '5\' 1"'  (1.549 m)   Wt 154 lb 9.6 oz (70.1 kg)   SpO2 97%   BMI 29.21 kg/m?   ?Wt Readings from Last 3 Encounters:  ?07/03/21 154 lb 9.6 oz (70.1 kg)  ?06/14/21 155 lb (70.3 kg)  ?03/14/21 149 lb 9.6 oz (67.9 kg)  ?  ?Physical Exam ?Vitals and nursing note reviewed.  ?Constitutional:   ?   General: She is not in acute distress. ?   Appearance: She is well-developed. She is not diaphoretic.  ?Eyes:  ?   Conjunctiva/sclera: Conjunctivae normal.  ?Musculoskeletal:     ?   General: Normal range of motion.  ?   Lumbar back: Tenderness present. No deformity or bony tenderness. Normal range of motion. Negative right straight leg raise test and negative left straight leg raise test.  ?     Back: ? ?Skin: ?   General: Skin is warm and dry.  ?   Findings: No rash.  ?Neurological:  ?   Mental Status: She is alert and oriented to person, place, and time.  ?   Coordination: Coordination normal.  ?Psychiatric:     ?   Behavior: Behavior normal.  ? ? ? ? ?Assessment & Plan:  ? ?Problem List Items Addressed This Visit   ?None ?Visit Diagnoses   ? ? Sacroiliac joint pain    -  Primary  ? Relevant Medications  ? methylPREDNISolone acetate (DEPO-MEDROL) injection 80 mg (Start on 07/03/2021 12:15 PM)  ? ?  ?  ?We will try steroid injection and recommended gentle stretching and continue with the Biofreeze and also using Voltaren gel which she has on it and that should improve it. ?Follow up plan: ?Return if symptoms worsen or fail to improve. ? ?Counseling provided for all of the vaccine components ?No orders of the defined types were placed in this encounter. ? ? ?Caryl Pina, MD ?[place] ?07/03/2021, 12:13 PM ? ? ? ? ?

## 2021-07-10 ENCOUNTER — Encounter: Payer: Self-pay | Admitting: Nurse Practitioner

## 2021-07-10 ENCOUNTER — Ambulatory Visit (INDEPENDENT_AMBULATORY_CARE_PROVIDER_SITE_OTHER): Payer: PPO | Admitting: Nurse Practitioner

## 2021-07-10 VITALS — BP 150/107 | HR 80 | Temp 98.6°F | Resp 20 | Ht 61.0 in | Wt 154.0 lb

## 2021-07-10 DIAGNOSIS — M7918 Myalgia, other site: Secondary | ICD-10-CM

## 2021-07-10 MED ORDER — DICLOFENAC SODIUM 1 % EX GEL: 2.0000 g | Freq: Four times a day (QID) | CUTANEOUS | 1 refills | Status: AC

## 2021-07-10 NOTE — Patient Instructions (Signed)
Gluteal Strain ? ?A gluteal strain happens when the muscles in the buttocks (gluteal muscles) are overstretched or torn. A tear can be partial or complete. A gluteal strain can cause pain and stiffness in your buttocks, legs, and lower back. A strain might also be called "pulling a muscle." ?The severity of a gluteal strain is rated as Grade 1, 2, or 3. A Grade 3 strain has the most tearing and pain. ?What are the causes? ?This condition may be caused by: ?Stretching the muscles too far. ?Putting too much stress on the muscles before they are warmed up. ?Overusing the muscles. ?Repetitive muscle movements over long periods of time. ?Injury. ?What increases the risk? ?The following factors may make you more likely to develop this condition: ?Being out in cold weather. ?Being physically tired. ?Having poor strength and flexibility. ?Not warming up properly before physical activity. ?Exercising or playing sports with sudden bursts of activity. ?Having poor exercise techniques. ?What are the signs or symptoms? ?Symptoms of this condition include: ?Pain in the buttocks, especially when moving the legs. Pain may spread to the lower back or to the legs. ?Bruising and swelling of the buttocks. ?Tenderness, weakness, or stiffness in the buttocks. ?Muscle spasms. ?How is this diagnosed? ?This condition is diagnosed based on: ?A physical exam. ?Your medical history. ?How well you can do certain range of motion exercises. ?Imaging tests, such as MRI, ultrasound, or X-rays. ?Your strain may be rated based on how severe it is. The ratings are: ?Grade 1 strain (mild). Muscles are overstretched. You might have very small muscle tears. This type of strain generally heals in about one week. ?Grade 2 strain (moderate). Muscles are partially torn. This may take one to two months to heal. ?Grade 3 strain (severe). Muscles are completely torn. A severe strain can take more than three months to heal. Grade 3 gluteal strains are rare. ?How  is this treated? ?This condition may be treated with: ?Resting, icing, and raising (elevating) the injured area as much as possible. ?Over-the-counter pain medicines. ?If your gluteal strain is severe or very painful, your health care provider may prescribe pain medicines or physical therapy. Surgery for severe strains is rare. ?Follow these instructions at home: ?Managing pain, stiffness, and swelling ? ?If directed, put ice on the injured area: ?Put ice in a plastic bag. ?Place a towel between your skin and the bag. ?Leave the ice on for 20 minutes, 2-3 times per day. ?Activity ?Do not drive or use heavy machinery while taking prescription pain medicine. ?Return to your normal activities as told by your health care provider. Ask your health care provider what activities are safe for you. ?Rest your gluteal muscles as much as possible, especially for the first 2-3 days. ?Begin exercising or stretching as told by your health care provider. ?General instructions ?Take over-the-counter and prescription medicines only as told by your health care provider. ?Follow your treatment plan as told by your health care provider. This may include: ?Physical therapy. ?Massage. ?Local electrical stimulation (transcutaneous electrical nerve stimulation, TENS). ?Keep all follow-up visits. This is important. ?How is this prevented? ?Warm up and stretch before physical activity. ?Stretch after physical activity. ?Learn and use correct techniques for exercising and playing sports. ?Avoid difficult physical activities if your muscles are tired or sore. ?Strengthen your gluteal muscles and the surrounding muscles. ?Develop your flexibility by stretching at least once a day. ?Contact a health care provider if: ?You have pain or swelling that gets worse or does not get better  with medicine. ?Summary ?A gluteal strain happens when the muscles in the buttocks (gluteal muscles) are overstretched or torn. ?A gluteal strain can cause pain and  stiffness in your buttocks, legs, and lower back. ?If your gluteal strain is severe or very painful, your health care provider may prescribe pain medicines or physical therapy. ?This information is not intended to replace advice given to you by your health care provider. Make sure you discuss any questions you have with your health care provider. ?Document Revised: 07/27/2020 Document Reviewed: 07/27/2020 ?Elsevier Patient Education ? [place]. ? ?

## 2021-07-10 NOTE — Progress Notes (Signed)
? ?Acute Office Visit ? ?Subjective:  ? ?  ?Patient ID: Sara Holmes, female    DOB: 01-25-1929, 88 y.o.   MRN: [id number] ? ?Chief Complaint  ?Patient presents with  ? Back Pain  ?  Lower right side - feels like a knot ( seen last week and got shot for left side lower pain )   ? ? ?Back Pain ?This is a recurrent problem. The current episode started in the past 7 days. The problem occurs constantly. The problem is unchanged. The pain is present in the lumbar spine and gluteal. The quality of the pain is described as aching. The pain does not radiate. The pain is at a severity of 8/10. The pain is moderate. The pain is The same all the time. The symptoms are aggravated by bending, position and standing. Pertinent negatives include no abdominal pain, bladder incontinence, bowel incontinence, fever, headaches, numbness, paresis, paresthesias or pelvic pain. She has tried ice for the symptoms. The treatment provided no relief.  ? ?Patient is in today for Pain ? ?She reports new onset right gluteal  pain. was an injury that may have caused the pain. The pain started yesterday and is staying constant. The pain does not radiate . The pain is described as aching, is 8/10 in intensity, occurring intermittently. Symptoms are worse in the: morning, mid-day, afternoon, evening  ?Aggravating factors: walking Relieving factors: none.  ?She has tried application of ice with no relief.  ? ? ? ?Review of Systems  ?Constitutional:  Negative for fever.  ?HENT: Negative.    ?Eyes: Negative.   ?Cardiovascular: Negative.   ?Gastrointestinal:  Negative for abdominal pain and bowel incontinence.  ?Genitourinary:  Negative for bladder incontinence and pelvic pain.  ?Musculoskeletal:  Positive for back pain.  ?Skin: Negative.   ?Neurological:  Negative for numbness, headaches and paresthesias.  ?All other systems reviewed and are negative. ? ? ?   ?Objective:  ?  ?BP (!) 150/107   Pulse 80   Temp 98.6 ?F (37 ?C)   Resp 20   Ht '5\' 1"'$   (1.549 m)   Wt 154 lb (69.9 kg)   SpO2 97%   BMI 29.10 kg/m?  ?BP Readings from Last 3 Encounters:  ?07/10/21 (!) 150/107  ?07/03/21 139/83  ?06/14/21 (!) 147/69  ? ?Wt Readings from Last 3 Encounters:  ?07/10/21 154 lb (69.9 kg)  ?07/03/21 154 lb 9.6 oz (70.1 kg)  ?06/14/21 155 lb (70.3 kg)  ? ?  ? ?Physical Exam ?Vitals and nursing note reviewed.  ?HENT:  ?   Head: Normocephalic.  ?   Right Ear: External ear normal.  ?   Left Ear: External ear normal.  ?   Nose: Nose normal.  ?Eyes:  ?   Conjunctiva/sclera: Conjunctivae normal.  ?Cardiovascular:  ?   Rate and Rhythm: Normal rate and regular rhythm.  ?   Pulses: Normal pulses.  ?   Heart sounds: Normal heart sounds.  ?Pulmonary:  ?   Effort: Pulmonary effort is normal.  ?   Breath sounds: Normal breath sounds.  ?Abdominal:  ?   General: Bowel sounds are normal.  ?Musculoskeletal:  ?   Lumbar back: Tenderness present. No spasms. Decreased range of motion.  ?     Back: ? ?   Comments: Lower back pain/right gluteal pain.  ?Skin: ?   General: Skin is dry.  ?   Findings: No rash.  ?Neurological:  ?   Mental Status: She is alert and oriented to  person, place, and time.  ?Psychiatric:     ?   Behavior: Behavior normal.  ? ? ?No results found for any visits on 07/10/21. ? ? ?   ?Assessment & Plan:  ?Pain on lower back and right gluteal area not well controlled. ?Tylenol as needed, warm compress as tolerated, Voltaren 1% topical gel, ?Education provided printed handouts given. ?Follow-up with worsening unresolved symptoms. ? ? ?Problem List Items Addressed This Visit   ?None ?Visit Diagnoses   ? ? Gluteal pain    -  Primary  ? Relevant Medications  ? diclofenac Sodium (VOLTAREN) 1 % GEL  ? ?  ? ? ?Meds ordered this encounter  ?Medications  ? diclofenac Sodium (VOLTAREN) 1 % GEL  ?  Sig: Apply 2 g topically 4 (four) times daily.  ?  Dispense:  50 g  ?  Refill:  1  ?  Order Specific Question:   Supervising Provider  ?  AnswerClaretta Fraise [[id number]]  ? ? ?Return if  symptoms worsen or fail to improve. ? ?Ivy Lynn, NP ? ? ?

## 2021-07-24 ENCOUNTER — Other Ambulatory Visit: Payer: Self-pay | Admitting: Family Medicine

## 2021-07-31 DIAGNOSIS — R5381 Other malaise: Secondary | ICD-10-CM | POA: Diagnosis not present

## 2021-07-31 DIAGNOSIS — W19XXXA Unspecified fall, initial encounter: Secondary | ICD-10-CM | POA: Diagnosis not present

## 2021-07-31 DIAGNOSIS — R531 Weakness: Secondary | ICD-10-CM | POA: Diagnosis not present

## 2021-08-22 DIAGNOSIS — L84 Corns and callosities: Secondary | ICD-10-CM | POA: Diagnosis not present

## 2021-08-22 DIAGNOSIS — M79676 Pain in unspecified toe(s): Secondary | ICD-10-CM | POA: Diagnosis not present

## 2021-08-22 DIAGNOSIS — E1142 Type 2 diabetes mellitus with diabetic polyneuropathy: Secondary | ICD-10-CM | POA: Diagnosis not present

## 2021-08-22 DIAGNOSIS — B351 Tinea unguium: Secondary | ICD-10-CM | POA: Diagnosis not present

## 2021-08-26 ENCOUNTER — Other Ambulatory Visit: Payer: Self-pay | Admitting: Family Medicine

## 2021-08-26 DIAGNOSIS — N1831 Chronic kidney disease, stage 3a: Secondary | ICD-10-CM

## 2021-08-26 DIAGNOSIS — I152 Hypertension secondary to endocrine disorders: Secondary | ICD-10-CM

## 2021-08-26 DIAGNOSIS — E1142 Type 2 diabetes mellitus with diabetic polyneuropathy: Secondary | ICD-10-CM

## 2021-09-25 ENCOUNTER — Other Ambulatory Visit: Payer: Self-pay | Admitting: Family Medicine

## 2021-09-25 DIAGNOSIS — E1169 Type 2 diabetes mellitus with other specified complication: Secondary | ICD-10-CM

## 2021-09-25 DIAGNOSIS — M8580 Other specified disorders of bone density and structure, unspecified site: Secondary | ICD-10-CM

## 2021-10-16 ENCOUNTER — Ambulatory Visit: Payer: PPO | Admitting: Family Medicine

## 2021-10-16 ENCOUNTER — Other Ambulatory Visit: Payer: Self-pay | Admitting: Family Medicine

## 2021-10-17 ENCOUNTER — Encounter: Payer: Self-pay | Admitting: Family Medicine

## 2021-10-17 ENCOUNTER — Ambulatory Visit (INDEPENDENT_AMBULATORY_CARE_PROVIDER_SITE_OTHER): Payer: PPO | Admitting: Family Medicine

## 2021-10-17 VITALS — BP 129/78 | HR 87 | Temp 98.1°F | Ht 61.0 in | Wt 148.2 lb

## 2021-10-17 DIAGNOSIS — E1142 Type 2 diabetes mellitus with diabetic polyneuropathy: Secondary | ICD-10-CM | POA: Diagnosis not present

## 2021-10-17 DIAGNOSIS — N1831 Chronic kidney disease, stage 3a: Secondary | ICD-10-CM | POA: Diagnosis not present

## 2021-10-17 DIAGNOSIS — E1169 Type 2 diabetes mellitus with other specified complication: Secondary | ICD-10-CM | POA: Diagnosis not present

## 2021-10-17 DIAGNOSIS — E785 Hyperlipidemia, unspecified: Secondary | ICD-10-CM

## 2021-10-17 DIAGNOSIS — R296 Repeated falls: Secondary | ICD-10-CM | POA: Diagnosis not present

## 2021-10-17 DIAGNOSIS — E1122 Type 2 diabetes mellitus with diabetic chronic kidney disease: Secondary | ICD-10-CM | POA: Diagnosis not present

## 2021-10-17 DIAGNOSIS — F40218 Other animal type phobia: Secondary | ICD-10-CM

## 2021-10-17 DIAGNOSIS — N183 Chronic kidney disease, stage 3 unspecified: Secondary | ICD-10-CM | POA: Diagnosis not present

## 2021-10-17 DIAGNOSIS — R42 Dizziness and giddiness: Secondary | ICD-10-CM | POA: Diagnosis not present

## 2021-10-17 DIAGNOSIS — I152 Hypertension secondary to endocrine disorders: Secondary | ICD-10-CM | POA: Diagnosis not present

## 2021-10-17 DIAGNOSIS — E1159 Type 2 diabetes mellitus with other circulatory complications: Secondary | ICD-10-CM | POA: Diagnosis not present

## 2021-10-17 LAB — BAYER DCA HB A1C WAIVED: HB A1C (BAYER DCA - WAIVED): 6.7 % — ABNORMAL HIGH (ref 4.8–5.6)

## 2021-10-17 NOTE — Progress Notes (Signed)
Subjective: CC:DM PCP: Sara Norlander, DO Sara Holmes is a 88 y.o. female presenting to clinic today for:  1. Type 2 Diabetes with hypertension, hyperlipidemia w/ CKD3a:  She reports compliance with her medications but notes that she thinks that her Plendil may be making her dizzy.  She has had some dizziness with positional changes but denies overt vertigo.  She is fallen twice since our last visit and attributes that to the Plendil.  She has had no hypoglycemic episodes and in fact her blood sugar has been running closer to the 170s which sometimes worries her because she wants to be below 120.  She had to call a family member her life alert twice now to help her get from the floor as she could not get up by herself due to getting no traction on the ground.  She admits that she has been quite afraid of a snake being in her house.  There apparently was 1 that came in when she left her door open and it took several friends coming over before it could be removed from the home.  Last eye exam: UTD Last foot exam: UTD Last A1c:  Lab Results  Component Value Date   HGBA1C 6.2 (H) 06/14/2021   Nephropathy screen indicated?: UTD Last flu, zoster and/or pneumovax:  Immunization History  Administered Date(s) Administered   Fluad Quad(high Dose 65+) 12/14/2019, 12/26/2020   Influenza Whole 12/28/2008   Influenza, High Dose Seasonal PF 12/11/2016, 12/20/2017   Influenza,inj,Quad PF,6+ Mos 12/03/2012, 01/01/2014, 12/31/2014, 11/28/2015   Influenza,inj,quad, With Preservative 12/03/2018   Influenza-Unspecified 12/04/2018   Moderna Covid-19 Vaccine Bivalent Booster 34yr & up 12/13/2020   Moderna Sars-Covid-2 Vaccination 05/11/2019, 06/08/2019, 01/26/2020, 06/21/2020   Pneumococcal Polysaccharide-23 03/31/2010   Zoster Recombinat (Shingrix) 11/07/2016, 04/01/2017   Zoster, Live 01/28/2007    ROS: No chest pain, shortness of breath, visual disturbance reported    ROS: Per  HPI  Allergies  Allergen Reactions   Prednisone Other (See Comments)    Elevated blood sugar   Amoxicillin Itching and Rash   Tetanus Toxoids Rash   Past Medical History:  Diagnosis Date   Diabetes mellitus without complication (HCC)    Diverticulosis    Glaucoma    Gout    Hyperlipidemia    Hypertension    Osteopenia    Plantar fasciitis    Sinus bradycardia    Vertigo     Current Outpatient Medications:    allopurinol (ZYLOPRIM) 100 MG tablet, TAKE 1 TABLET EVERY DAY, Disp: 90 tablet, Rfl: 0   aspirin 81 MG tablet, Take 81 mg by mouth daily., Disp: , Rfl:    blood glucose meter kit and supplies, 1 each by Other route as directed. Dispense based on patient and insurance preference. Use once per day fasting. (FOR ICD-10 E10.9, E11.9)., Disp: 1 each, Rfl: 0   Calcium Carbonate-Vitamin D (CALTRATE 600+D PO), Take 1 tablet by mouth 2 (two) times daily., Disp: , Rfl:    Cholecalciferol (VITAMIN D3) 1000 UNITS CAPS, Take 1 tablet by mouth daily., Disp: , Rfl:    diclofenac Sodium (VOLTAREN) 1 % GEL, Apply 2 g topically 4 (four) times daily., Disp: 50 g, Rfl: 1   felodipine (PLENDIL) 2.5 MG 24 hr tablet, TAKE 1 TABLET DAILY, Disp: 90 tablet, Rfl: 0   fosinopril (MONOPRIL) 40 MG tablet, TAKE 1 TABLET DAILY, Disp: 90 tablet, Rfl: 0   gabapentin (NEURONTIN) 300 MG capsule, TAKE 1 CAPSULE AT BEDTIME, Disp: 90 capsule, Rfl: 0  glucose blood (ONETOUCH ULTRA) test strip, CHECK BLOOD SUGAR ONCE A DAY Dx E11.9, Disp: 100 strip, Rfl: 3   halobetasol (ULTRAVATE) 0.05 % cream, Apply topically 2 (two) times daily., Disp: , Rfl:    Lancets (ONETOUCH DELICA PLUS HYWVPX10G) MISC, CHECK BLOOD SUGAR ONCE DAILY OR AS DIRECTED Dx E11.42, Disp: 100 each, Rfl: 3   meclizine (ANTIVERT) 25 MG tablet, TAKE (1) TABLET THREE TIMES DAILY AS NEEDED DIZZINESS, Disp: 30 tablet, Rfl: 0   metFORMIN (GLUCOPHAGE) 1000 MG tablet, Take 0.5 tablets (500 mg total) by mouth daily with breakfast. *Dose change, Disp: 45  tablet, Rfl: 3   raloxifene (EVISTA) 60 MG tablet, TAKE 1 TABLET DAILY, Disp: 90 tablet, Rfl: 0   rosuvastatin (CRESTOR) 10 MG tablet, TAKE 1/2 TABLET DAILY, Disp: 45 tablet, Rfl: 0   triamterene-hydrochlorothiazide (DYAZIDE) 37.5-25 MG capsule, TAKE 1 CAPSULE DAILY, Disp: 90 capsule, Rfl: 0 Social History   Socioeconomic History   Marital status: Widowed    Spouse name: Not on file   Number of children: Not on file   Years of education: Not on file   Highest education level: Not on file  Occupational History   Occupation: retired  Tobacco Use   Smoking status: Never   Smokeless tobacco: Never  Vaping Use   Vaping Use: Never used  Substance and Sexual Activity   Alcohol use: No   Drug use: No   Sexual activity: Never  Other Topics Concern   Not on file  Social History Narrative   Lives alone   Close friends and family nearby   Social Determinants of Health   Financial Resource Strain: [place]  (01/06/2021)   Overall Financial Resource Strain (CARDIA)    Difficulty of Paying Living Expenses: Not hard at all  Food Insecurity: No Food Insecurity (01/06/2021)   Hunger Vital Sign    Worried About Running Out of Food in the Last Year: Never true    [place] in the Last Year: Never true  Transportation Needs: No Transportation Needs (01/06/2021)   PRAPARE - Hydrologist (Medical): No    Lack of Transportation (Non-Medical): No  Physical Activity: Insufficiently Active (01/06/2021)   Exercise Vital Sign    Days of Exercise per Week: 7 days    Minutes of Exercise per Session: 10 min  Stress: No Stress Concern Present (01/06/2021)   [place]    Feeling of Stress : Not at all  Social Connections: Moderately Integrated (01/06/2021)   Social Connection and Isolation Panel [NHANES]    Frequency of Communication with Friends and Family: More than three times a week    Frequency  of Social Gatherings with Friends and Family: More than three times a week    Attends Religious Services: More than 4 times per year    Active Member of [employer] or Organizations: Yes    Attends Archivist Meetings: 1 to 4 times per year    Marital Status: Widowed  Intimate Partner Violence: Not At Risk (01/06/2021)   Humiliation, Afraid, Rape, and Kick questionnaire    Fear of Current or Ex-Partner: No    Emotionally Abused: No    Physically Abused: No    Sexually Abused: No   Family History  Problem Relation Age of Onset   Heart disease Mother    Hypertension Mother    Cancer Father        lymp nodes  Hypertension Brother     Objective: Office vital signs reviewed. BP 129/78   Pulse 87   Temp 98.1 F (36.7 C)   Ht '5\' 1"'  (1.549 m)   Wt 148 lb 3.2 oz (67.2 kg)   SpO2 94%   BMI 28.00 kg/m   Physical Examination:  General: Awake, alert, nontoxic elderly female, No acute distress HEENT: Sclera white Cardio: regular rate and rhythm, S1S2 heard, no murmurs appreciated Pulm: clear to auscultation bilaterally, no wheezes, rhonchi or rales; normal work of breathing on room air MSK: Ambulating with use of cane  Assessment/ Plan: 88 y.o. female   Type 2 diabetes mellitus with stage 3a chronic kidney disease, without long-term current use of insulin (HCC) - Plan: CBC with Differential/Platelet, CMP14+EGFR, Bayer DCA Hb A1c Waived, VITAMIN D 25 Hydroxy (Vit-D Deficiency, Fractures), Lipid panel, Bayer DCA Hb A1c Waived, CBC with Differential/Platelet, CMP14+EGFR, VITAMIN D 25 Hydroxy (Vit-D Deficiency, Fractures), Lipid panel  Diabetic polyneuropathy associated with type 2 diabetes mellitus ([place])  Hyperlipidemia associated with type 2 diabetes mellitus ([place])  Hypertension associated with diabetes ([place])  Other animal type phobia  Frequent falls  Dizziness  Sugar remains under good control with A1c of 6.7.  Discussed goal blood sugar to be less than 200 but closer  to 150 given that she is 59+-year-old has high risk for fall.  Want to avoid hypoglycemic episodes.  Check renal function, lipid panel, CBC and vitamin D level given CKD  Wonder if diabetic neuropathy may be impacting some of her balance issues as well.  Discussed potential referral to physical therapy for strengthening exercises but she declined this today  Uncertain how much her fear of snakes is impacting her ability to move around normally.  She identified this as a barrier today however  Okay to hold Plendil.  Monitor blood pressures.  Discussed goal of less than 150/90.  Orders Placed This Encounter  Procedures   CBC with Differential/Platelet    Standing Status:   Future    Number of Occurrences:   1    Standing Expiration Date:   10/18/2022   CMP14+EGFR    Standing Status:   Future    Number of Occurrences:   1    Standing Expiration Date:   10/18/2022   Bayer DCA Hb A1c Waived    Standing Status:   Future    Number of Occurrences:   1    Standing Expiration Date:   10/18/2022   VITAMIN D 25 Hydroxy (Vit-D Deficiency, Fractures)    Standing Status:   Future    Number of Occurrences:   1    Standing Expiration Date:   10/18/2022   Lipid panel    Standing Status:   Future    Number of Occurrences:   1    Standing Expiration Date:   10/18/2022   No orders of the defined types were placed in this encounter.    Sara Norlander, DO [place] [phone]

## 2021-10-17 NOTE — Patient Instructions (Signed)
HOLD Felodipine for now. MONITOR blood pressure. GOAL blood pressure less than 150/90. Your sugar goals are more lenient at your age. Ok to have them as high as 180.   The lower "normal" sugars put you at risk for falls/ dizziness.

## 2021-10-18 LAB — CBC WITH DIFFERENTIAL/PLATELET
Basophils Absolute: 0 10*3/uL (ref 0.0–0.2)
Basos: 1 %
EOS (ABSOLUTE): 0.1 10*3/uL (ref 0.0–0.4)
Eos: 1 %
Hematocrit: 35.3 % (ref 34.0–46.6)
Hemoglobin: 11.9 g/dL (ref 11.1–15.9)
Immature Grans (Abs): 0 10*3/uL (ref 0.0–0.1)
Immature Granulocytes: 0 %
Lymphocytes Absolute: 1.7 10*3/uL (ref 0.7–3.1)
Lymphs: 31 %
MCH: 32.8 pg (ref 26.6–33.0)
MCHC: 33.7 g/dL (ref 31.5–35.7)
MCV: 97 fL (ref 79–97)
Monocytes Absolute: 0.4 10*3/uL (ref 0.1–0.9)
Monocytes: 8 %
Neutrophils Absolute: 3.3 10*3/uL (ref 1.4–7.0)
Neutrophils: 59 %
Platelets: 202 10*3/uL (ref 150–450)
RBC: 3.63 x10E6/uL — ABNORMAL LOW (ref 3.77–5.28)
RDW: 12.6 % (ref 11.7–15.4)
WBC: 5.6 10*3/uL (ref 3.4–10.8)

## 2021-10-18 LAB — CMP14+EGFR
ALT: 9 IU/L (ref 0–32)
AST: 20 IU/L (ref 0–40)
Albumin/Globulin Ratio: 2.2 (ref 1.2–2.2)
Albumin: 3.9 g/dL (ref 3.6–4.6)
Alkaline Phosphatase: 60 IU/L (ref 44–121)
BUN/Creatinine Ratio: 9 — ABNORMAL LOW (ref 12–28)
BUN: 10 mg/dL (ref 10–36)
Bilirubin Total: 0.9 mg/dL (ref 0.0–1.2)
CO2: 24 mmol/L (ref 20–29)
Calcium: 9.4 mg/dL (ref 8.7–10.3)
Chloride: 99 mmol/L (ref 96–106)
Creatinine, Ser: 1.09 mg/dL — ABNORMAL HIGH (ref 0.57–1.00)
Globulin, Total: 1.8 g/dL (ref 1.5–4.5)
Glucose: 160 mg/dL — ABNORMAL HIGH (ref 70–99)
Potassium: 4.3 mmol/L (ref 3.5–5.2)
Sodium: 140 mmol/L (ref 134–144)
Total Protein: 5.7 g/dL — ABNORMAL LOW (ref 6.0–8.5)
eGFR: 48 mL/min/{1.73_m2} — ABNORMAL LOW (ref >59)

## 2021-10-18 LAB — LIPID PANEL
Chol/HDL Ratio: 1.8 ratio (ref 0.0–4.4)
Cholesterol, Total: 139 mg/dL (ref 100–199)
HDL: 76 mg/dL (ref >39)
LDL Chol Calc (NIH): 45 mg/dL (ref 0–99)
Triglycerides: 100 mg/dL (ref 0–149)
VLDL Cholesterol Cal: 18 mg/dL (ref 5–40)

## 2021-10-18 LAB — VITAMIN D 25 HYDROXY (VIT D DEFICIENCY, FRACTURES): Vit D, 25-Hydroxy: 45.4 ng/mL (ref 30.0–100.0)

## 2021-11-01 DIAGNOSIS — R69 Illness, unspecified: Secondary | ICD-10-CM | POA: Diagnosis not present

## 2021-11-01 DIAGNOSIS — R5381 Other malaise: Secondary | ICD-10-CM | POA: Diagnosis not present

## 2021-11-14 DIAGNOSIS — E1142 Type 2 diabetes mellitus with diabetic polyneuropathy: Secondary | ICD-10-CM | POA: Diagnosis not present

## 2021-11-14 DIAGNOSIS — M79676 Pain in unspecified toe(s): Secondary | ICD-10-CM | POA: Diagnosis not present

## 2021-11-14 DIAGNOSIS — B351 Tinea unguium: Secondary | ICD-10-CM | POA: Diagnosis not present

## 2021-11-14 DIAGNOSIS — L84 Corns and callosities: Secondary | ICD-10-CM | POA: Diagnosis not present

## 2021-11-25 ENCOUNTER — Other Ambulatory Visit: Payer: Self-pay | Admitting: Family Medicine

## 2021-11-25 DIAGNOSIS — E1142 Type 2 diabetes mellitus with diabetic polyneuropathy: Secondary | ICD-10-CM

## 2021-11-25 DIAGNOSIS — E1159 Type 2 diabetes mellitus with other circulatory complications: Secondary | ICD-10-CM

## 2021-11-25 DIAGNOSIS — N1831 Chronic kidney disease, stage 3a: Secondary | ICD-10-CM

## 2021-11-29 ENCOUNTER — Ambulatory Visit (INDEPENDENT_AMBULATORY_CARE_PROVIDER_SITE_OTHER): Payer: PPO | Admitting: Family Medicine

## 2021-11-29 ENCOUNTER — Encounter: Payer: Self-pay | Admitting: Family Medicine

## 2021-11-29 VITALS — BP 112/79 | HR 82 | Temp 97.8°F | Ht 61.0 in | Wt 147.0 lb

## 2021-11-29 DIAGNOSIS — E1122 Type 2 diabetes mellitus with diabetic chronic kidney disease: Secondary | ICD-10-CM | POA: Diagnosis not present

## 2021-11-29 DIAGNOSIS — E1142 Type 2 diabetes mellitus with diabetic polyneuropathy: Secondary | ICD-10-CM | POA: Diagnosis not present

## 2021-11-29 DIAGNOSIS — R296 Repeated falls: Secondary | ICD-10-CM

## 2021-11-29 DIAGNOSIS — N1831 Chronic kidney disease, stage 3a: Secondary | ICD-10-CM

## 2021-11-29 MED ORDER — ONETOUCH ULTRA VI STRP: ORAL_STRIP | 3 refills | Status: DC

## 2021-11-29 MED ORDER — ONETOUCH DELICA PLUS LANCET33G MISC: 3 refills | Status: DC

## 2021-11-29 MED ORDER — BLOOD GLUCOSE METER KIT: 1.0000 | PACK | 0 refills | Status: AC

## 2021-11-29 NOTE — Patient Instructions (Signed)
STOP triamterene STAY OFF felodipine CONTINUE Fosinopril MONITOR Blood pressure. Goal <150/90.

## 2021-11-29 NOTE — Progress Notes (Addendum)
Subjective: CC: Follow-up blood pressure PCP: Janora Norlander, DO FHQ:RFXJO KALANY DIEKMANN is a 88 y.o. female presenting to clinic today for:  1.  Hypertension associated with type 2 diabetes and CKD 3A Patient was reporting some dizziness and had had a couple of falls over the summer so we discontinued her felodipine last visit.  Her blood pressures have consistently run 130s over 70s at home.  Today her blood pressure is 112/79 and her blood pressure cuff is reliable.  She has continued on the fosinopril 40 mg and the triamterene-hydrochlorothiazide.  She denies any significant edema.  She did have 1 mechanical fall since her last visit when her shoe got caught on her screen door.  She denies any dizziness today.  No chest pain, shortness of breath   ROS: Per HPI  Allergies  Allergen Reactions   Prednisone Other (See Comments)    Elevated blood sugar   Amoxicillin Itching and Rash   Tetanus Toxoids Rash   Past Medical History:  Diagnosis Date   Diabetes mellitus without complication (HCC)    Diverticulosis    Glaucoma    Gout    Hyperlipidemia    Hypertension    Osteopenia    Plantar fasciitis    Sinus bradycardia    Vertigo     Current Outpatient Medications:    allopurinol (ZYLOPRIM) 100 MG tablet, TAKE 1 TABLET EVERY DAY, Disp: 90 tablet, Rfl: 0   aspirin 81 MG tablet, Take 81 mg by mouth daily., Disp: , Rfl:    blood glucose meter kit and supplies, 1 each by Other route as directed. Dispense based on patient and insurance preference. Use once per day fasting. (FOR ICD-10 E10.9, E11.9)., Disp: 1 each, Rfl: 0   Calcium Carbonate-Vitamin D (CALTRATE 600+D PO), Take 1 tablet by mouth 2 (two) times daily., Disp: , Rfl:    Cholecalciferol (VITAMIN D3) 1000 UNITS CAPS, Take 1 tablet by mouth daily., Disp: , Rfl:    diclofenac Sodium (VOLTAREN) 1 % GEL, Apply 2 g topically 4 (four) times daily., Disp: 50 g, Rfl: 1   felodipine (PLENDIL) 2.5 MG 24 hr tablet, TAKE 1 TABLET  DAILY, Disp: 90 tablet, Rfl: 0   fosinopril (MONOPRIL) 40 MG tablet, TAKE ONE TABLET ONCE DAILY, Disp: 90 tablet, Rfl: 1   gabapentin (NEURONTIN) 300 MG capsule, TAKE ONE CAPSULE AT BEDTIME, Disp: 90 capsule, Rfl: 0   glucose blood (ONETOUCH ULTRA) test strip, CHECK BLOOD SUGAR ONCE A DAY Dx E11.9, Disp: 100 strip, Rfl: 3   halobetasol (ULTRAVATE) 0.05 % cream, Apply topically 2 (two) times daily., Disp: , Rfl:    Lancets (ONETOUCH DELICA PLUS [id number]) MISC, CHECK BLOOD SUGAR ONCE DAILY OR AS DIRECTED Dx E11.42, Disp: 100 each, Rfl: 3   meclizine (ANTIVERT) 25 MG tablet, TAKE (1) TABLET THREE TIMES DAILY AS NEEDED DIZZINESS, Disp: 30 tablet, Rfl: 0   metFORMIN (GLUCOPHAGE) 1000 MG tablet, Take 0.5 tablets (500 mg total) by mouth daily with breakfast. *Dose change, Disp: 45 tablet, Rfl: 3   raloxifene (EVISTA) 60 MG tablet, TAKE 1 TABLET DAILY, Disp: 90 tablet, Rfl: 0   rosuvastatin (CRESTOR) 10 MG tablet, TAKE 1/2 TABLET DAILY, Disp: 45 tablet, Rfl: 0   triamterene-hydrochlorothiazide (DYAZIDE) 37.5-25 MG capsule, TAKE ONE CAPSULE ONCE DAILY, Disp: 90 capsule, Rfl: 1 Social History   Socioeconomic History   Marital status: Widowed    Spouse name: Not on file   Number of children: Not on file   Years of education: Not  on file   Highest education level: Not on file  Occupational History   Occupation: retired  Tobacco Use   Smoking status: Never   Smokeless tobacco: Never  Vaping Use   Vaping Use: Never used  Substance and Sexual Activity   Alcohol use: No   Drug use: No   Sexual activity: Never  Other Topics Concern   Not on file  Social History Narrative   Lives alone   Close friends and family nearby   Social Determinants of Health   Financial Resource Strain: Low Risk  (01/06/2021)   Overall Financial Resource Strain (CARDIA)    Difficulty of Paying Living Expenses: Not hard at all  Food Insecurity: No Food Insecurity (01/06/2021)   Hunger Vital Sign    Worried About  Running Out of Food in the Last Year: Never true    [place] in the Last Year: Never true  Transportation Needs: No Transportation Needs (01/06/2021)   PRAPARE - Hydrologist (Medical): No    Lack of Transportation (Non-Medical): No  Physical Activity: Insufficiently Active (01/06/2021)   Exercise Vital Sign    Days of Exercise per Week: 7 days    Minutes of Exercise per Session: 10 min  Stress: No Stress Concern Present (01/06/2021)   [place]    Feeling of Stress : Not at all  Social Connections: Moderately Integrated (01/06/2021)   Social Connection and Isolation Panel [NHANES]    Frequency of Communication with Friends and Family: More than three times a week    Frequency of Social Gatherings with Friends and Family: More than three times a week    Attends Religious Services: More than 4 times per year    Active Member of [employer] or Organizations: Yes    Attends Archivist Meetings: 1 to 4 times per year    Marital Status: Widowed  Intimate Partner Violence: Not At Risk (01/06/2021)   Humiliation, Afraid, Rape, and Kick questionnaire    Fear of Current or Ex-Partner: No    Emotionally Abused: No    Physically Abused: No    Sexually Abused: No   Family History  Problem Relation Age of Onset   Heart disease Mother    Hypertension Mother    Cancer Father        lymp nodes   Hypertension Brother     Objective: Office vital signs reviewed. BP 112/79 Comment: OUR MACHINE  Pulse 82   Temp 97.8 F (36.6 C)   Ht '5\' 1"'  (1.549 m)   Wt 147 lb (66.7 kg)   SpO2 98%   BMI 27.78 kg/m   Physical Examination:  General: Awake, alert, well nourished, No acute distress HEENT: Sclera white.  Moist mucous membranes Cardio: regular rate and rhythm, S1S2 heard, no murmurs appreciated Pulm: clear to auscultation bilaterally, no wheezes, rhonchi or rales; normal work of  breathing on room air MSK: Slow gait.  Utilizes cane for ambulation  Assessment/ Plan: 88 y.o. female   Frequent falls - Plan: Ambulatory referral to [place]  Type 2 diabetes mellitus with stage 3a chronic kidney disease, without long-term current use of insulin ([place]) - Plan: blood glucose meter kit and supplies, glucose blood (ONETOUCH ULTRA) test strip, Lancets (ONETOUCH DELICA PLUS [id number]) MISC  Diabetic polyneuropathy associated with type 2 diabetes mellitus ([place]) - Plan: Ambulatory referral to [place]  I am going to have her hold her triamterene  for the next couple of weeks and monitor blood pressures at home.  If she remains less than 150/90 she does not need to resume.  She will come in for blood pressure check with nurse in 2 weeks.  I do question orthostasis in this patient his blood pressure is on the low side today.  She will continue her ACE inhibitor given her known CKD 3.  We may consider reducing this even further.  I will see her again in 6 weeks for interval checkup.   She requested going back on the One Touch ultra 2 so I have renewed that meter and testing supplies for patient  No orders of the defined types were placed in this encounter.  No orders of the defined types were placed in this encounter.   **addendum.  Had recurrent fall day after appointment.  Home health PT ordered.  Patient needs strengthening/ balance training.  Janora Norlander, DO [place] [phone]

## 2021-12-04 ENCOUNTER — Telehealth: Payer: Self-pay | Admitting: Family Medicine

## 2021-12-04 NOTE — Telephone Encounter (Signed)
Called and spoke to patient she fell this weekend. Patient fell on left side so she is just real bruised. Rotating heat and ice. Offered appointment pt declined at this time. Using a cane at house. Stepping inside of house is hard for her. Patient is asking for pt in home states she fell the day of last appointment with you she states yall discussed falls can she have referral for PT in home. Please advise. She is aware Dr.G is off today.

## 2021-12-04 NOTE — Telephone Encounter (Signed)
Pt called to let Dr Lajuana Ripple know that she fell again over the weekend.  Requesting to speak with Dr Lajuana Ripple or nurse regarding her falls. Has questions.

## 2021-12-05 NOTE — Addendum Note (Signed)
Addended by: Janora Norlander on: 12/05/2021 12:12 PM   Modules accepted: Orders, Level of Service

## 2021-12-05 NOTE — Telephone Encounter (Signed)
ordered

## 2021-12-13 ENCOUNTER — Ambulatory Visit: Payer: PPO

## 2021-12-21 DIAGNOSIS — E785 Hyperlipidemia, unspecified: Secondary | ICD-10-CM | POA: Diagnosis not present

## 2021-12-21 DIAGNOSIS — Z7984 Long term (current) use of oral hypoglycemic drugs: Secondary | ICD-10-CM | POA: Diagnosis not present

## 2021-12-21 DIAGNOSIS — H409 Unspecified glaucoma: Secondary | ICD-10-CM | POA: Diagnosis not present

## 2021-12-21 DIAGNOSIS — E1159 Type 2 diabetes mellitus with other circulatory complications: Secondary | ICD-10-CM | POA: Diagnosis not present

## 2021-12-21 DIAGNOSIS — Z9181 History of falling: Secondary | ICD-10-CM | POA: Diagnosis not present

## 2021-12-21 DIAGNOSIS — I152 Hypertension secondary to endocrine disorders: Secondary | ICD-10-CM | POA: Diagnosis not present

## 2021-12-21 DIAGNOSIS — K5790 Diverticulosis of intestine, part unspecified, without perforation or abscess without bleeding: Secondary | ICD-10-CM | POA: Diagnosis not present

## 2021-12-21 DIAGNOSIS — M1030 Gout due to renal impairment, unspecified site: Secondary | ICD-10-CM | POA: Diagnosis not present

## 2021-12-21 DIAGNOSIS — Z7982 Long term (current) use of aspirin: Secondary | ICD-10-CM | POA: Diagnosis not present

## 2021-12-21 DIAGNOSIS — E1142 Type 2 diabetes mellitus with diabetic polyneuropathy: Secondary | ICD-10-CM | POA: Diagnosis not present

## 2021-12-21 DIAGNOSIS — M8580 Other specified disorders of bone density and structure, unspecified site: Secondary | ICD-10-CM | POA: Diagnosis not present

## 2021-12-21 DIAGNOSIS — E1122 Type 2 diabetes mellitus with diabetic chronic kidney disease: Secondary | ICD-10-CM | POA: Diagnosis not present

## 2021-12-21 DIAGNOSIS — N1831 Chronic kidney disease, stage 3a: Secondary | ICD-10-CM | POA: Diagnosis not present

## 2021-12-23 ENCOUNTER — Other Ambulatory Visit: Payer: Self-pay | Admitting: Family Medicine

## 2021-12-23 DIAGNOSIS — M8580 Other specified disorders of bone density and structure, unspecified site: Secondary | ICD-10-CM

## 2021-12-23 DIAGNOSIS — E1169 Type 2 diabetes mellitus with other specified complication: Secondary | ICD-10-CM

## 2021-12-29 DIAGNOSIS — Z7982 Long term (current) use of aspirin: Secondary | ICD-10-CM | POA: Diagnosis not present

## 2021-12-29 DIAGNOSIS — E1142 Type 2 diabetes mellitus with diabetic polyneuropathy: Secondary | ICD-10-CM | POA: Diagnosis not present

## 2021-12-29 DIAGNOSIS — Z9181 History of falling: Secondary | ICD-10-CM | POA: Diagnosis not present

## 2021-12-29 DIAGNOSIS — M8580 Other specified disorders of bone density and structure, unspecified site: Secondary | ICD-10-CM | POA: Diagnosis not present

## 2021-12-29 DIAGNOSIS — E1122 Type 2 diabetes mellitus with diabetic chronic kidney disease: Secondary | ICD-10-CM | POA: Diagnosis not present

## 2021-12-29 DIAGNOSIS — E785 Hyperlipidemia, unspecified: Secondary | ICD-10-CM | POA: Diagnosis not present

## 2021-12-29 DIAGNOSIS — I152 Hypertension secondary to endocrine disorders: Secondary | ICD-10-CM | POA: Diagnosis not present

## 2021-12-29 DIAGNOSIS — K5790 Diverticulosis of intestine, part unspecified, without perforation or abscess without bleeding: Secondary | ICD-10-CM | POA: Diagnosis not present

## 2021-12-29 DIAGNOSIS — E1159 Type 2 diabetes mellitus with other circulatory complications: Secondary | ICD-10-CM | POA: Diagnosis not present

## 2021-12-29 DIAGNOSIS — N1831 Chronic kidney disease, stage 3a: Secondary | ICD-10-CM | POA: Diagnosis not present

## 2021-12-29 DIAGNOSIS — M1030 Gout due to renal impairment, unspecified site: Secondary | ICD-10-CM | POA: Diagnosis not present

## 2021-12-29 DIAGNOSIS — H409 Unspecified glaucoma: Secondary | ICD-10-CM | POA: Diagnosis not present

## 2021-12-29 DIAGNOSIS — Z7984 Long term (current) use of oral hypoglycemic drugs: Secondary | ICD-10-CM | POA: Diagnosis not present

## 2022-01-03 ENCOUNTER — Ambulatory Visit (INDEPENDENT_AMBULATORY_CARE_PROVIDER_SITE_OTHER): Payer: PPO

## 2022-01-03 DIAGNOSIS — E1159 Type 2 diabetes mellitus with other circulatory complications: Secondary | ICD-10-CM

## 2022-01-03 DIAGNOSIS — M8580 Other specified disorders of bone density and structure, unspecified site: Secondary | ICD-10-CM

## 2022-01-03 DIAGNOSIS — H409 Unspecified glaucoma: Secondary | ICD-10-CM | POA: Diagnosis not present

## 2022-01-03 DIAGNOSIS — E785 Hyperlipidemia, unspecified: Secondary | ICD-10-CM

## 2022-01-03 DIAGNOSIS — M1030 Gout due to renal impairment, unspecified site: Secondary | ICD-10-CM

## 2022-01-03 DIAGNOSIS — N1831 Chronic kidney disease, stage 3a: Secondary | ICD-10-CM

## 2022-01-03 DIAGNOSIS — E1142 Type 2 diabetes mellitus with diabetic polyneuropathy: Secondary | ICD-10-CM | POA: Diagnosis not present

## 2022-01-03 DIAGNOSIS — E1122 Type 2 diabetes mellitus with diabetic chronic kidney disease: Secondary | ICD-10-CM | POA: Diagnosis not present

## 2022-01-03 DIAGNOSIS — K5790 Diverticulosis of intestine, part unspecified, without perforation or abscess without bleeding: Secondary | ICD-10-CM | POA: Diagnosis not present

## 2022-01-03 DIAGNOSIS — I152 Hypertension secondary to endocrine disorders: Secondary | ICD-10-CM | POA: Diagnosis not present

## 2022-01-08 ENCOUNTER — Ambulatory Visit (INDEPENDENT_AMBULATORY_CARE_PROVIDER_SITE_OTHER): Payer: PPO | Admitting: Family Medicine

## 2022-01-08 ENCOUNTER — Encounter: Payer: Self-pay | Admitting: Family Medicine

## 2022-01-08 VITALS — BP 174/86 | HR 118 | Temp 98.2°F | Ht 61.0 in | Wt 153.0 lb

## 2022-01-08 DIAGNOSIS — E1159 Type 2 diabetes mellitus with other circulatory complications: Secondary | ICD-10-CM | POA: Diagnosis not present

## 2022-01-08 DIAGNOSIS — M8580 Other specified disorders of bone density and structure, unspecified site: Secondary | ICD-10-CM | POA: Diagnosis not present

## 2022-01-08 DIAGNOSIS — I152 Hypertension secondary to endocrine disorders: Secondary | ICD-10-CM

## 2022-01-08 DIAGNOSIS — R600 Localized edema: Secondary | ICD-10-CM

## 2022-01-08 DIAGNOSIS — E1142 Type 2 diabetes mellitus with diabetic polyneuropathy: Secondary | ICD-10-CM

## 2022-01-08 DIAGNOSIS — R296 Repeated falls: Secondary | ICD-10-CM

## 2022-01-08 MED ORDER — HYDROCHLOROTHIAZIDE 12.5 MG PO TABS: 12.5000 mg | ORAL_TABLET | Freq: Every morning | ORAL | 3 refills | Status: DC

## 2022-01-08 NOTE — Patient Instructions (Signed)
Fluid pill added. Return in 2 weeks for blood pressure check with nurse and potassium check (lab). You do NOT have to fast.

## 2022-01-08 NOTE — Progress Notes (Signed)
Subjective: CC: Follow-up hypertension PCP: Janora Norlander, DO EHM:CNOBS Sara Holmes is a 88 y.o. female presenting to clinic today for:  1.  Hypertension, frequent falls Patient was last seen 11/29/2021.  Her blood pressure at that time was 112/79.  She has been endorsing frequent falls that were not necessarily preceded by dizziness or prodrome.  Given her advanced age and low blood pressure, her triamterene was held and she was instructed to come back for blood pressure check within 2 weeks.  She presents today and has been off of the triamterene.  She denies any dizziness but does report a fall shortly after I saw her in September.  Again this was not preceded by dizziness.  She does not recall loss of consciousness.  She requires assistance to get back up and has a lifealert if needed.  Typically, her neighbor will come and help her up.  She is working with physical therapy and does feel like this is helping.  She has not had a fall since that time.  She has considered assisted living but really has not delved much into that.  ROS: Per HPI  Allergies  Allergen Reactions   Prednisone Other (See Comments)    Elevated blood sugar   Amoxicillin Itching and Rash   Tetanus Toxoids Rash   Past Medical History:  Diagnosis Date   Diabetes mellitus without complication (HCC)    Diverticulosis    Glaucoma    Gout    Hyperlipidemia    Hypertension    Osteopenia    Plantar fasciitis    Sinus bradycardia    Vertigo     Current Outpatient Medications:    allopurinol (ZYLOPRIM) 100 MG tablet, TAKE 1 TABLET EVERY DAY, Disp: 90 tablet, Rfl: 0   aspirin 81 MG tablet, Take 81 mg by mouth daily., Disp: , Rfl:    blood glucose meter kit and supplies, 1 each by Other route as directed. Dispense ONE TOUCH ULTRA 2. Use once per day fasting. (FOR ICD-10 E10.9, E11.9)., Disp: 1 each, Rfl: 0   Calcium Carbonate-Vitamin D (CALTRATE 600+D PO), Take 1 tablet by mouth 2 (two) times daily., Disp: , Rfl:     Cholecalciferol (VITAMIN D3) 1000 UNITS CAPS, Take 1 tablet by mouth daily., Disp: , Rfl:    diclofenac Sodium (VOLTAREN) 1 % GEL, Apply 2 g topically 4 (four) times daily., Disp: 50 g, Rfl: 1   fosinopril (MONOPRIL) 40 MG tablet, TAKE ONE TABLET ONCE DAILY, Disp: 90 tablet, Rfl: 1   gabapentin (NEURONTIN) 300 MG capsule, TAKE ONE CAPSULE AT BEDTIME, Disp: 90 capsule, Rfl: 0   glucose blood (ONETOUCH ULTRA) test strip, CHECK BLOOD SUGAR ONCE A DAY Dx E11.9, Disp: 100 strip, Rfl: 3   halobetasol (ULTRAVATE) 0.05 % cream, Apply topically 2 (two) times daily., Disp: , Rfl:    Lancets (ONETOUCH DELICA PLUS [id number]) MISC, CHECK BLOOD SUGAR ONCE DAILY OR AS DIRECTED Dx E11.42 (one touch ultra 2), Disp: 100 each, Rfl: 3   meclizine (ANTIVERT) 25 MG tablet, TAKE (1) TABLET THREE TIMES DAILY AS NEEDED DIZZINESS, Disp: 30 tablet, Rfl: 0   metFORMIN (GLUCOPHAGE) 1000 MG tablet, Take 0.5 tablets (500 mg total) by mouth daily with breakfast. *Dose change, Disp: 45 tablet, Rfl: 3   raloxifene (EVISTA) 60 MG tablet, TAKE 1 TABLET DAILY, Disp: 90 tablet, Rfl: 0   rosuvastatin (CRESTOR) 10 MG tablet, TAKE 1/2 TABLET DAILY, Disp: 45 tablet, Rfl: 0 Social History   Socioeconomic History   Marital status:  Widowed    Spouse name: Not on file   Number of children: Not on file   Years of education: Not on file   Highest education level: Not on file  Occupational History   Occupation: retired  Tobacco Use   Smoking status: Never   Smokeless tobacco: Never  Vaping Use   Vaping Use: Never used  Substance and Sexual Activity   Alcohol use: No   Drug use: No   Sexual activity: Never  Other Topics Concern   Not on file  Social History Narrative   Lives alone   Close friends and family nearby   Social Determinants of Health   Financial Resource Strain: [place]  (01/06/2021)   Overall Financial Resource Strain (CARDIA)    Difficulty of Paying Living Expenses: Not hard at all  Food Insecurity: No  Food Insecurity (01/06/2021)   Hunger Vital Sign    Worried About Running Out of Food in the Last Year: Never true    [place] in the Last Year: Never true  Transportation Needs: No Transportation Needs (01/06/2021)   PRAPARE - Hydrologist (Medical): No    Lack of Transportation (Non-Medical): No  Physical Activity: Insufficiently Active (01/06/2021)   Exercise Vital Sign    Days of Exercise per Week: 7 days    Minutes of Exercise per Session: 10 min  Stress: No Stress Concern Present (01/06/2021)   [place]    Feeling of Stress : Not at all  Social Connections: Moderately Integrated (01/06/2021)   Social Connection and Isolation Panel [NHANES]    Frequency of Communication with Friends and Family: More than three times a week    Frequency of Social Gatherings with Friends and Family: More than three times a week    Attends Religious Services: More than 4 times per year    Active Member of [employer] or Organizations: Yes    Attends Archivist Meetings: 1 to 4 times per year    Marital Status: Widowed  Intimate Partner Violence: Not At Risk (01/06/2021)   Humiliation, Afraid, Rape, and Kick questionnaire    Fear of Current or Ex-Partner: No    Emotionally Abused: No    Physically Abused: No    Sexually Abused: No   Family History  Problem Relation Age of Onset   Heart disease Mother    Hypertension Mother    Cancer Father        lymp nodes   Hypertension Brother     Objective: Office vital signs reviewed. BP (!) 174/86   Pulse (!) 118   Temp 98.2 F (36.8 C)   Ht _0  (1.549 m)   Wt 153 lb (69.4 kg)   SpO2 98%   BMI 28.91 kg/m   Physical Examination:  General: Awake, alert, nontoxic elderly female, No acute distress HEENT: sclera white, MMM Cardio: regular rate and rhythm, S1S2 heard, no murmurs appreciated Pulm: clear to auscultation bilaterally, no  wheezes, rhonchi or rales; normal work of breathing on room air Extremities: warm, well perfused, 2+ pitting edema to mid shins bilaterall MSK: Required assistance for ambulation.  Gait is slow Skin: Ruptured bullae noted along the left lateral leg.  No evidence of secondary infection.  Minimal surrounding erythema but no warmth or induration.  Assessment/ Plan: 88 y.o. female   Frequent falls  Osteopenia with high risk of fracture  Diabetic polyneuropathy associated with type 2 diabetes  mellitus ([place])  Hypertension associated with diabetes ([place]) - Plan: hydrochlorothiazide (HYDRODIURIL) 12.5 MG tablet, Basic metabolic panel  Bilateral leg edema - Plan: hydrochlorothiazide (HYDRODIURIL) 12.5 MG tablet, Basic metabolic panel  Luckily, no falls since being with physical therapy but she has had roughly 1 fall per month for the last several months.  This was thought to be precipitated by generalized deconditioning and possible orthostasis.  Her blood pressure is entirely too high today some going to add a very low-dose of hydrochlorothiazide to help with the edema that she has been experiencing in her legs.  I would like to see her back again for blood pressure recheck in the next week or so and we will check a BMP to ensure that she is not having any low potassium levels as a result.  May need to consider adding back the triamterene portion if we do see low K.  Continue to work with physical therapy.  I highly recommended that she start looking into assisted living facility so that she has an idea of where she would be willing to go if this is needed.  We discussed the risks of recurrent falls including trauma to the head and death  No orders of the defined types were placed in this encounter.  No orders of the defined types were placed in this encounter.    Janora Norlander, DO [place] [phone]

## 2022-01-10 DIAGNOSIS — M1030 Gout due to renal impairment, unspecified site: Secondary | ICD-10-CM | POA: Diagnosis not present

## 2022-01-10 DIAGNOSIS — H409 Unspecified glaucoma: Secondary | ICD-10-CM | POA: Diagnosis not present

## 2022-01-10 DIAGNOSIS — N1831 Chronic kidney disease, stage 3a: Secondary | ICD-10-CM | POA: Diagnosis not present

## 2022-01-10 DIAGNOSIS — E1142 Type 2 diabetes mellitus with diabetic polyneuropathy: Secondary | ICD-10-CM | POA: Diagnosis not present

## 2022-01-10 DIAGNOSIS — E1159 Type 2 diabetes mellitus with other circulatory complications: Secondary | ICD-10-CM | POA: Diagnosis not present

## 2022-01-10 DIAGNOSIS — Z7984 Long term (current) use of oral hypoglycemic drugs: Secondary | ICD-10-CM | POA: Diagnosis not present

## 2022-01-10 DIAGNOSIS — M8580 Other specified disorders of bone density and structure, unspecified site: Secondary | ICD-10-CM | POA: Diagnosis not present

## 2022-01-10 DIAGNOSIS — I152 Hypertension secondary to endocrine disorders: Secondary | ICD-10-CM | POA: Diagnosis not present

## 2022-01-10 DIAGNOSIS — E1122 Type 2 diabetes mellitus with diabetic chronic kidney disease: Secondary | ICD-10-CM | POA: Diagnosis not present

## 2022-01-10 DIAGNOSIS — E785 Hyperlipidemia, unspecified: Secondary | ICD-10-CM | POA: Diagnosis not present

## 2022-01-10 DIAGNOSIS — K5790 Diverticulosis of intestine, part unspecified, without perforation or abscess without bleeding: Secondary | ICD-10-CM | POA: Diagnosis not present

## 2022-01-10 DIAGNOSIS — Z7982 Long term (current) use of aspirin: Secondary | ICD-10-CM | POA: Diagnosis not present

## 2022-01-10 DIAGNOSIS — Z9181 History of falling: Secondary | ICD-10-CM | POA: Diagnosis not present

## 2022-01-16 ENCOUNTER — Other Ambulatory Visit: Payer: Self-pay | Admitting: Family Medicine

## 2022-01-23 ENCOUNTER — Ambulatory Visit (INDEPENDENT_AMBULATORY_CARE_PROVIDER_SITE_OTHER): Payer: PPO | Admitting: *Deleted

## 2022-01-23 VITALS — BP 138/74 | HR 83

## 2022-01-23 DIAGNOSIS — R600 Localized edema: Secondary | ICD-10-CM | POA: Diagnosis not present

## 2022-01-23 DIAGNOSIS — Z23 Encounter for immunization: Secondary | ICD-10-CM | POA: Diagnosis not present

## 2022-01-23 DIAGNOSIS — E1159 Type 2 diabetes mellitus with other circulatory complications: Secondary | ICD-10-CM

## 2022-01-23 DIAGNOSIS — I152 Hypertension secondary to endocrine disorders: Secondary | ICD-10-CM | POA: Diagnosis not present

## 2022-01-24 ENCOUNTER — Other Ambulatory Visit: Payer: Self-pay | Admitting: Family Medicine

## 2022-01-24 DIAGNOSIS — E876 Hypokalemia: Secondary | ICD-10-CM

## 2022-01-24 LAB — BASIC METABOLIC PANEL
BUN/Creatinine Ratio: 9 — ABNORMAL LOW (ref 12–28)
BUN: 8 mg/dL — ABNORMAL LOW (ref 10–36)
CO2: 28 mmol/L (ref 20–29)
Calcium: 8.7 mg/dL (ref 8.7–10.3)
Chloride: 94 mmol/L — ABNORMAL LOW (ref 96–106)
Creatinine, Ser: 0.93 mg/dL (ref 0.57–1.00)
Glucose: 139 mg/dL — ABNORMAL HIGH (ref 70–99)
Potassium: 3.3 mmol/L — ABNORMAL LOW (ref 3.5–5.2)
Sodium: 139 mmol/L (ref 134–144)
eGFR: 58 mL/min/{1.73_m2} — ABNORMAL LOW (ref >59)

## 2022-01-24 MED ORDER — POTASSIUM CHLORIDE CRYS ER 20 MEQ PO TBCR: 20.0000 meq | EXTENDED_RELEASE_TABLET | Freq: Every day | ORAL | 3 refills | Status: DC

## 2022-01-25 ENCOUNTER — Emergency Department (HOSPITAL_BASED_OUTPATIENT_CLINIC_OR_DEPARTMENT_OTHER): Payer: PPO | Admitting: Radiology

## 2022-01-25 ENCOUNTER — Encounter: Payer: Self-pay | Admitting: Family Medicine

## 2022-01-25 ENCOUNTER — Other Ambulatory Visit: Payer: Self-pay

## 2022-01-25 ENCOUNTER — Encounter (HOSPITAL_COMMUNITY): Payer: Self-pay

## 2022-01-25 ENCOUNTER — Ambulatory Visit (INDEPENDENT_AMBULATORY_CARE_PROVIDER_SITE_OTHER): Payer: PPO | Admitting: Family Medicine

## 2022-01-25 ENCOUNTER — Encounter (HOSPITAL_BASED_OUTPATIENT_CLINIC_OR_DEPARTMENT_OTHER): Payer: Self-pay | Admitting: Emergency Medicine

## 2022-01-25 ENCOUNTER — Emergency Department (HOSPITAL_BASED_OUTPATIENT_CLINIC_OR_DEPARTMENT_OTHER)
Admission: EM | Admit: 2022-01-25 | Discharge: 2022-01-26 | Disposition: A | Discharge status: Home / Self Care | Payer: PPO | Attending: Emergency Medicine | Admitting: Emergency Medicine

## 2022-01-25 VITALS — BP 146/78 | HR 100 | Temp 98.2°F | Ht 61.0 in | Wt 143.0 lb

## 2022-01-25 DIAGNOSIS — E1165 Type 2 diabetes mellitus with hyperglycemia: Secondary | ICD-10-CM | POA: Diagnosis not present

## 2022-01-25 DIAGNOSIS — I10 Essential (primary) hypertension: Secondary | ICD-10-CM | POA: Diagnosis not present

## 2022-01-25 DIAGNOSIS — D692 Other nonthrombocytopenic purpura: Secondary | ICD-10-CM | POA: Diagnosis not present

## 2022-01-25 DIAGNOSIS — I517 Cardiomegaly: Secondary | ICD-10-CM | POA: Diagnosis not present

## 2022-01-25 DIAGNOSIS — I509 Heart failure, unspecified: Secondary | ICD-10-CM | POA: Diagnosis not present

## 2022-01-25 DIAGNOSIS — R002 Palpitations: Secondary | ICD-10-CM | POA: Diagnosis not present

## 2022-01-25 DIAGNOSIS — N183 Chronic kidney disease, stage 3 unspecified: Secondary | ICD-10-CM | POA: Diagnosis not present

## 2022-01-25 DIAGNOSIS — Z7982 Long term (current) use of aspirin: Secondary | ICD-10-CM | POA: Diagnosis not present

## 2022-01-25 DIAGNOSIS — E261 Secondary hyperaldosteronism: Secondary | ICD-10-CM | POA: Diagnosis not present

## 2022-01-25 DIAGNOSIS — Z7984 Long term (current) use of oral hypoglycemic drugs: Secondary | ICD-10-CM | POA: Diagnosis not present

## 2022-01-25 DIAGNOSIS — E119 Type 2 diabetes mellitus without complications: Secondary | ICD-10-CM | POA: Diagnosis not present

## 2022-01-25 DIAGNOSIS — I130 Hypertensive heart and chronic kidney disease with heart failure and stage 1 through stage 4 chronic kidney disease, or unspecified chronic kidney disease: Secondary | ICD-10-CM | POA: Diagnosis not present

## 2022-01-25 DIAGNOSIS — E1169 Type 2 diabetes mellitus with other specified complication: Secondary | ICD-10-CM | POA: Diagnosis not present

## 2022-01-25 DIAGNOSIS — R600 Localized edema: Secondary | ICD-10-CM | POA: Insufficient documentation

## 2022-01-25 DIAGNOSIS — E1122 Type 2 diabetes mellitus with diabetic chronic kidney disease: Secondary | ICD-10-CM | POA: Diagnosis not present

## 2022-01-25 DIAGNOSIS — E1136 Type 2 diabetes mellitus with diabetic cataract: Secondary | ICD-10-CM | POA: Diagnosis not present

## 2022-01-25 DIAGNOSIS — Z79899 Other long term (current) drug therapy: Secondary | ICD-10-CM | POA: Diagnosis not present

## 2022-01-25 DIAGNOSIS — I4891 Unspecified atrial fibrillation: Secondary | ICD-10-CM

## 2022-01-25 DIAGNOSIS — E1159 Type 2 diabetes mellitus with other circulatory complications: Secondary | ICD-10-CM | POA: Diagnosis not present

## 2022-01-25 DIAGNOSIS — E11621 Type 2 diabetes mellitus with foot ulcer: Secondary | ICD-10-CM | POA: Diagnosis not present

## 2022-01-25 DIAGNOSIS — E1142 Type 2 diabetes mellitus with diabetic polyneuropathy: Secondary | ICD-10-CM | POA: Diagnosis not present

## 2022-01-25 DIAGNOSIS — I499 Cardiac arrhythmia, unspecified: Secondary | ICD-10-CM

## 2022-01-25 LAB — CBC
HCT: 33.3 % — ABNORMAL LOW (ref 36.0–46.0)
Hemoglobin: 11.2 g/dL — ABNORMAL LOW (ref 12.0–15.0)
MCH: 32.4 pg (ref 26.0–34.0)
MCHC: 33.6 g/dL (ref 30.0–36.0)
MCV: 96.2 fL (ref 80.0–100.0)
Platelets: 239 10*3/uL (ref 150–400)
RBC: 3.46 MIL/uL — ABNORMAL LOW (ref 3.87–5.11)
RDW: 14.3 % (ref 11.5–15.5)
WBC: 6.4 10*3/uL (ref 4.0–10.5)
nRBC: 0 % (ref 0.0–0.2)

## 2022-01-25 LAB — BASIC METABOLIC PANEL
Anion gap: 12 (ref 5–15)
BUN: 10 mg/dL (ref 8–23)
CO2: 28 mmol/L (ref 22–32)
Calcium: 8.9 mg/dL (ref 8.9–10.3)
Chloride: 95 mmol/L — ABNORMAL LOW (ref 98–111)
Creatinine, Ser: 0.95 mg/dL (ref 0.44–1.00)
GFR, Estimated: 57 mL/min — ABNORMAL LOW (ref >60)
Glucose, Bld: 164 mg/dL — ABNORMAL HIGH (ref 70–99)
Potassium: 3.4 mmol/L — ABNORMAL LOW (ref 3.5–5.1)
Sodium: 135 mmol/L (ref 135–145)

## 2022-01-25 LAB — BRAIN NATRIURETIC PEPTIDE: B Natriuretic Peptide: 661.9 pg/mL — ABNORMAL HIGH (ref 0.0–100.0)

## 2022-01-25 LAB — TROPONIN I (HIGH SENSITIVITY)
Troponin I (High Sensitivity): 22 ng/L — ABNORMAL HIGH (ref <18)
Troponin I (High Sensitivity): 25 ng/L — ABNORMAL HIGH (ref <18)

## 2022-01-25 LAB — MAGNESIUM: Magnesium: 1.3 mg/dL — ABNORMAL LOW (ref 1.7–2.4)

## 2022-01-25 MED ORDER — MAGNESIUM SULFATE 2 GM/50ML IV SOLN
2.0000 g | Freq: Once | INTRAVENOUS | Status: AC
  Administered 2022-01-25: 2 g via INTRAVENOUS

## 2022-01-25 MED ORDER — POTASSIUM CHLORIDE CRYS ER 20 MEQ PO TBCR
40.0000 meq | EXTENDED_RELEASE_TABLET | Freq: Once | ORAL | Status: AC
  Administered 2022-01-25: 40 meq via ORAL
  Filled 2022-01-25: qty 2

## 2022-01-25 MED ORDER — MAGNESIUM OXIDE -MG SUPPLEMENT 400 (240 MG) MG PO TABS
400.0000 mg | ORAL_TABLET | Freq: Once | ORAL | Status: AC
  Administered 2022-01-25: 400 mg via ORAL
  Filled 2022-01-25: qty 1

## 2022-01-25 NOTE — ED Provider Notes (Signed)
[facility] Provider Note   CSN: [id number] Arrival date & time: 01/25/22  7829     History  Chief Complaint  Patient presents with   Atrial Fibrillation    Sara Holmes is a 88 y.o. female.  Patient with a history of diabetes, hypertension, hyperlipidemia presenting with irregular heartbeats.  States her home health nurse today felt her heartbeat to be irregular and she was sent to her PCP.  They did an EKG which showed atrial fibrillation and she was sent to the ED.  Denies any history of atrial fibrillation.  Denies any chest pain or shortness of breath.  States she feels at her baseline.  She has intermittent spells of lightheadedness and dizziness which has been an ongoing issue for her for several months.  This is not changed today.  She is not dizzy currently.  No room spinning vertigo.  No pain with urination or blood in the urine.  No chest pain or shortness of breath.  Does have some leg swelling at baseline which is unchanged.  The history is provided by the patient and a relative.  Atrial Fibrillation Pertinent negatives include no abdominal pain, no headaches and no shortness of breath.       Home Medications Prior to Admission medications   Medication Sig Start Date End Date Taking? Authorizing Provider  allopurinol (ZYLOPRIM) 100 MG tablet TAKE 1 TABLET EVERY DAY 01/16/22   Ronnie Doss M, DO  aspirin 81 MG tablet Take 81 mg by mouth daily.    [provider]  blood glucose meter kit and supplies 1 each by Other route as directed. Dispense ONE TOUCH ULTRA 2. Use once per day fasting. (FOR ICD-10 E10.9, E11.9). 11/29/21   Janora Norlander, DO  Calcium Carbonate-Vitamin D (CALTRATE 600+D PO) Take 1 tablet by mouth 2 (two) times daily.    [provider]  Cholecalciferol (VITAMIN D3) 1000 UNITS CAPS Take 1 tablet by mouth daily.    [provider]  diclofenac Sodium (VOLTAREN) 1 % GEL Apply 2 g topically 4  (four) times daily. 07/10/21   Ivy Lynn, NP  fosinopril (MONOPRIL) 40 MG tablet TAKE ONE TABLET ONCE DAILY 11/26/21   Ronnie Doss M, DO  gabapentin (NEURONTIN) 300 MG capsule TAKE ONE CAPSULE AT BEDTIME 11/26/21   Ronnie Doss M, DO  glucose blood (ONETOUCH ULTRA) test strip CHECK BLOOD SUGAR ONCE A DAY Dx E11.9 11/29/21   Ronnie Doss M, DO  halobetasol (ULTRAVATE) 0.05 % cream Apply topically 2 (two) times daily.    [provider]  hydrochlorothiazide (HYDRODIURIL) 12.5 MG tablet Take 1 tablet (12.5 mg total) by mouth in the morning. For blood pressure/ swelling 01/08/22   Gottschalk, Leatrice Jewels M, DO  Lancets (ONETOUCH DELICA PLUS [id number]) MISC CHECK BLOOD SUGAR ONCE DAILY OR AS DIRECTED Dx E11.42 (one touch ultra 2) 11/29/21   Ronnie Doss M, DO  meclizine (ANTIVERT) 25 MG tablet TAKE (1) TABLET THREE TIMES DAILY AS NEEDED DIZZINESS 08/01/20   Ronnie Doss M, DO  metFORMIN (GLUCOPHAGE) 1000 MG tablet Take 0.5 tablets (500 mg total) by mouth daily with breakfast. *Dose change 03/14/21   Ronnie Doss M, DO  potassium chloride SA (KLOR-CON M) 20 MEQ tablet Take 1 tablet (20 mEq total) by mouth daily. Come in for labs 01/31/22. 01/24/22   Janora Norlander, DO  raloxifene (EVISTA) 60 MG tablet TAKE 1 TABLET DAILY 12/25/21   Ronnie Doss M, DO  rosuvastatin (CRESTOR) 10 MG tablet TAKE  1/2 TABLET DAILY 12/25/21   Ronnie Doss M, DO      Allergies    Prednisone, Amoxicillin, and Tetanus toxoids    Review of Systems   Review of Systems  Constitutional:  Negative for activity change, appetite change and fever.  HENT:  Negative for congestion and rhinorrhea.   Respiratory:  Negative for cough, chest tightness and shortness of breath.   Cardiovascular:  Positive for palpitations and leg swelling.  Gastrointestinal:  Negative for abdominal pain, nausea and vomiting.  Genitourinary:  Negative for dysuria.  Musculoskeletal:  Negative for arthralgias and  myalgias.  Skin:  Negative for rash.  Neurological:  Positive for dizziness and light-headedness. Negative for weakness and headaches.   all other systems are negative except as noted in the HPI and PMH.    Physical Exam Updated Vital Signs BP (!) 150/117   Pulse (!) 52   Temp 97.7 F (36.5 C) (Temporal)   Resp 16   SpO2 97%  Physical Exam Vitals and nursing note reviewed.  Constitutional:      General: She is not in acute distress.    Appearance: She is well-developed. She is not ill-appearing.  HENT:     Head: Normocephalic and atraumatic.     Mouth/Throat:     Pharynx: No oropharyngeal exudate.  Eyes:     Conjunctiva/sclera: Conjunctivae normal.     Pupils: Pupils are equal, round, and reactive to light.  Neck:     Comments: No meningismus. Cardiovascular:     Rate and Rhythm: Normal rate. Rhythm irregular.     Heart sounds: Normal heart sounds. No murmur heard.    Comments: Irregular, rates 80s and 90s Pulmonary:     Effort: Pulmonary effort is normal. No respiratory distress.     Breath sounds: Normal breath sounds.  Abdominal:     Palpations: Abdomen is soft.     Tenderness: There is no abdominal tenderness. There is no guarding or rebound.  Musculoskeletal:        General: No tenderness. Normal range of motion.     Cervical back: Normal range of motion and neck supple.     Right lower leg: Edema present.     Left lower leg: Edema present.  Skin:    General: Skin is warm.  Neurological:     Mental Status: She is alert and oriented to person, place, and time.     Cranial Nerves: No cranial nerve deficit.     Motor: No abnormal muscle tone.     Coordination: Coordination normal.     Comments:  5/5 strength throughout. CN 2-12 intact.Equal grip strength.   Psychiatric:        Behavior: Behavior normal.     ED Results / Procedures / Treatments   Labs (all labs ordered are listed, but only abnormal results are displayed) Labs Reviewed  BASIC METABOLIC  PANEL - Abnormal; Notable for the following components:      Result Value   Potassium 3.4 (*)    Chloride 95 (*)    Glucose, Bld 164 (*)    GFR, Estimated 57 (*)    All other components within normal limits  CBC - Abnormal; Notable for the following components:   RBC 3.46 (*)    Hemoglobin 11.2 (*)    HCT 33.3 (*)    All other components within normal limits  BRAIN NATRIURETIC PEPTIDE - Abnormal; Notable for the following components:   B Natriuretic Peptide 661.9 (*)    All other  components within normal limits  MAGNESIUM - Abnormal; Notable for the following components:   Magnesium 1.3 (*)    All other components within normal limits  TROPONIN I (HIGH SENSITIVITY) - Abnormal; Notable for the following components:   Troponin I (High Sensitivity) 22 (*)    All other components within normal limits  TROPONIN I (HIGH SENSITIVITY) - Abnormal; Notable for the following components:   Troponin I (High Sensitivity) 25 (*)    All other components within normal limits    EKG EKG Interpretation  Date/Time:  Thursday January 25 2022 18:03:08 EST Ventricular Rate:  100 PR Interval:    QRS Duration: 72 QT Interval:  350 QTC Calculation: 451 R Axis:   0 Text Interpretation: Atrial fibrillation with a competing junctional pacemaker Minimal voltage criteria for LVH, may be normal variant ( R in aVL ) Abnormal ECG No previous ECGs available No previous ECGs available Confirmed by Ezequiel Essex [phone]) on 01/25/2022 7:00:04 PM  Radiology DG Chest 2 View  Result Date: 01/25/2022 CLINICAL DATA:  Irregular heartbeat.  AFib. EXAM: CHEST - 2 VIEW COMPARISON:  Chest radiograph 01/18/2015. FINDINGS: The heart is at the upper limits of normal for size. The upper mediastinal contours are normal. There is no focal consolidation or pulmonary edema. There is no pleural effusion or pneumothorax There is no acute osseous abnormality. IMPRESSION: Borderline cardiomegaly. Otherwise no radiographic  evidence of acute cardiopulmonary process. Electronically Signed   By: Valetta Mole M.D.   On: 01/25/2022 19:44    Procedures Procedures    Medications Ordered in ED Medications - No data to display  ED Course/ Medical Decision Making/ A&P                           Medical Decision Making Amount and/or Complexity of Data Reviewed Labs: ordered. Decision-making details documented in ED Course. Radiology: ordered and independent interpretation performed. Decision-making details documented in ED Course. ECG/medicine tests: ordered and independent interpretation performed. Decision-making details documented in ED Course.  Risk OTC drugs. Prescription drug management.  Patient sent from PCPs office with irregular heartbeats.  No chest pain or shortness of breath.  EKG shows rate controlled atrial fibrillation with no history of same.    Chads vasc Score is 5.  On review of EKGs, patient appears to have sinus rhythm with ectopy and PACs without true atrial fibrillation.  P waves are present.  Discussed with Dr. Alveda Reasons cardiology who reviewed EKG and agrees.  Recommends replacing her electrolytes and follow-up with cardiology for Holter monitor and echocardiogram. She agrees no indication for anticoagulation currently.    Potassium and magnesium to be replaced.  No chest pain or shortness of breath.  No dizziness or lightheadedness.  No nausea or vomiting.  No sweating.  Patient remains in sinus rhythm with frequent PACs.  No evidence of atrial fibrillation.  Refer to cardiology.  Troponins remain minimally elevated and flat.  No chest pain.  Low suspicion for ACS, pulmonary embolism, or aortic dissection.  Return to the ED sooner with exertional chest pain, pain associate with shortness of breath, nausea, vomiting, sweating, any other concerns.        Final Clinical Impression(s) / ED Diagnoses Final diagnoses:  Palpitations  Hypomagnesemia    Rx / DC Orders ED  Discharge Orders     None         Muriel Wilber, Annie Main, MD 01/25/22 2313

## 2022-01-25 NOTE — Progress Notes (Signed)
   ECGs reviewed and no Afib seen. Pt w/ frequent ectopy and some artifact but P waves clearly seen.  However, pt w/ hx LE edema, started on HCTZ, but now w/ ectopy and low K+.  At this time, no palpitations and no documentation of Afib, so will not order monitor.  It would be prudent to have her evaluated by Cardiology, will send a message on this.   They can decide on further eval  Rosaria Ferries, PA-C 01/25/2022 7:57 PM

## 2022-01-25 NOTE — Discharge Instructions (Addendum)
As we discussed, it does not appear that you are in atrial fibrillation but are having extra beats called PACs.  The cardiologist should call you to arrange an appointment this week.  Return to the ED sooner with chest pain, shortness of breath, nausea, vomiting, sweating, other concerns

## 2022-01-25 NOTE — ED Triage Notes (Signed)
Pt arrives to ED with c/o atrial fibrillation. She had an annual check up from a nurse today who auscultated an irregular heart beat. An EKG was done which showed Afib. Pt denies hx of Afib.

## 2022-01-25 NOTE — ED Notes (Addendum)
Late entry -- Pt awake and alert lying in bed with nephew at bedside.  RR even and unlabored on RA with symmetrical rise and fall of chest - trachea midline.  Continuous cardiac and pulse ox initiated -- pt denies cp/denies sob.  Pt fluctuating between controlled and uncontrolled A-Fib (HR 89-107); Dr. Wyvonnia Dusky aware.  Abdomen soft, round, nontender.  BLE +4 pitting edema noted.  +palpable peripheral pulses.  L lower lateral dressing noted with non-adherent dressing and xeroform covering site.  20G L AC dressing dry and intact.  Will monitor for acute changes and maintain plan of care as pt awaits test results and updated plan of care.

## 2022-01-25 NOTE — ED Notes (Signed)
Patient transported to X-ray 

## 2022-01-26 ENCOUNTER — Ambulatory Visit: Payer: PPO | Admitting: Family Medicine

## 2022-01-26 ENCOUNTER — Other Ambulatory Visit: Payer: Self-pay | Admitting: Family Medicine

## 2022-01-26 DIAGNOSIS — K5790 Diverticulosis of intestine, part unspecified, without perforation or abscess without bleeding: Secondary | ICD-10-CM | POA: Diagnosis not present

## 2022-01-26 DIAGNOSIS — E1142 Type 2 diabetes mellitus with diabetic polyneuropathy: Secondary | ICD-10-CM | POA: Diagnosis not present

## 2022-01-26 DIAGNOSIS — Z7984 Long term (current) use of oral hypoglycemic drugs: Secondary | ICD-10-CM | POA: Diagnosis not present

## 2022-01-26 DIAGNOSIS — E1159 Type 2 diabetes mellitus with other circulatory complications: Secondary | ICD-10-CM | POA: Diagnosis not present

## 2022-01-26 DIAGNOSIS — M1030 Gout due to renal impairment, unspecified site: Secondary | ICD-10-CM | POA: Diagnosis not present

## 2022-01-26 DIAGNOSIS — I499 Cardiac arrhythmia, unspecified: Secondary | ICD-10-CM

## 2022-01-26 DIAGNOSIS — Z7982 Long term (current) use of aspirin: Secondary | ICD-10-CM | POA: Diagnosis not present

## 2022-01-26 DIAGNOSIS — I152 Hypertension secondary to endocrine disorders: Secondary | ICD-10-CM | POA: Diagnosis not present

## 2022-01-26 DIAGNOSIS — M8580 Other specified disorders of bone density and structure, unspecified site: Secondary | ICD-10-CM | POA: Diagnosis not present

## 2022-01-26 DIAGNOSIS — E785 Hyperlipidemia, unspecified: Secondary | ICD-10-CM | POA: Diagnosis not present

## 2022-01-26 DIAGNOSIS — N1831 Chronic kidney disease, stage 3a: Secondary | ICD-10-CM | POA: Diagnosis not present

## 2022-01-26 DIAGNOSIS — Z9181 History of falling: Secondary | ICD-10-CM | POA: Diagnosis not present

## 2022-01-26 DIAGNOSIS — E1122 Type 2 diabetes mellitus with diabetic chronic kidney disease: Secondary | ICD-10-CM | POA: Diagnosis not present

## 2022-01-26 DIAGNOSIS — H409 Unspecified glaucoma: Secondary | ICD-10-CM | POA: Diagnosis not present

## 2022-01-26 NOTE — ED Notes (Signed)
Pt and family member (nephew) agreeable with d/c plan as discussed by provider- this nurse has verbally reinforced d/c instructions and provided family member with written copy - pt and family acknowledge verbal understanding and deny any add'l questions concerns needs- pt escorted via w/c to vehicle -- nephew is driver.

## 2022-01-26 NOTE — Progress Notes (Signed)
Orders Placed This Encounter  Procedures   Ambulatory referral to Cardiology    Referral Priority:   Routine    Referral Type:   Consultation    Referral Reason:   Specialty Services Required    Requested Specialty:   Cardiology    Number of Visits Requested:   1

## 2022-01-28 ENCOUNTER — Encounter: Payer: Self-pay | Admitting: Family Medicine

## 2022-01-28 NOTE — Progress Notes (Signed)
Subjective:  Patient ID: Sara Holmes, female    DOB: [birth year]  Age: 88 y.o. MRN: [id number]  CC: Fall and Irregular Heart Beat   HPI Sara Holmes presents for Irregular  heart beat and palpitations. She had a fall earlier and was seen by the insurance nurse who advised her to be seen immediately. She has no chest pain or dyspnea. Felt lightheaded.  No LOC. Fall was not severe. NKI. She feels okay.      01/25/2022    3:49 PM 11/29/2021    9:33 AM 10/17/2021    9:14 AM  Depression screen PHQ 2/9  Decreased Interest 0 0 0  Down, Depressed, Hopeless 0 0 0  PHQ - 2 Score 0 0 0  Altered sleeping  0 1  Tired, decreased energy  0 1  Change in appetite  0 1  Feeling bad or failure about yourself   0 0  Trouble concentrating  0 0  Moving slowly or fidgety/restless  0 0  Suicidal thoughts  0 0  PHQ-9 Score  0 3  Difficult doing work/chores  Not difficult at all Not difficult at all    History Sara Holmes has a past medical history of Diabetes mellitus without complication ([place]), Diverticulosis, Glaucoma, Gout, Hyperlipidemia, Hypertension, Osteopenia, Plantar fasciitis, Sinus bradycardia, and Vertigo.   She has a past surgical history that includes Cholecystectomy; Abdominal hysterectomy; Eye surgery; and Nasal septum surgery.   Her family history includes Cancer in her father; Heart disease in her mother; Hypertension in her brother and mother.She reports that she has never smoked. She has never used smokeless tobacco. She reports that she does not drink alcohol and does not use drugs.    ROS Review of Systems  Constitutional: Negative.   HENT: Negative.    Eyes:  Negative for visual disturbance.  Respiratory:  Negative for shortness of breath.   Cardiovascular:  Negative for chest pain.  Gastrointestinal:  Negative for abdominal pain.  Musculoskeletal:  Negative for arthralgias.    Objective:  BP (!) 146/78   Pulse 100   Temp 98.2 F (36.8 C)   Ht _0  (1.549 m)   Wt  143 lb (64.9 kg)   SpO2 95%   BMI 27.02 kg/m   BP Readings from Last 3 Encounters:  01/25/22 (!) 163/106  01/25/22 (!) 146/78  01/23/22 138/74    Wt Readings from Last 3 Encounters:  01/25/22 143 lb (64.9 kg)  01/08/22 153 lb (69.4 kg)  11/29/21 147 lb (66.7 kg)     Physical Exam Constitutional:      General: She is not in acute distress.    Appearance: She is well-developed.  Cardiovascular:     Rate and Rhythm: Tachycardia present. Rhythm irregularly irregular.  Pulmonary:     Breath sounds: Normal breath sounds.  Musculoskeletal:        General: Normal range of motion.  Skin:    General: Skin is warm and dry.  Neurological:     Mental Status: She is alert and oriented to person, place, and time.       Assessment & Plan:     I am having Sara Holmes maintain her Calcium Carbonate-Vitamin D (CALTRATE 600+D PO), Vitamin D3, aspirin, meclizine, halobetasol, metFORMIN, diclofenac Sodium, fosinopril, gabapentin, blood glucose meter kit and supplies, OneTouch Ultra, OneTouch Delica Plus [id number], rosuvastatin, raloxifene, hydrochlorothiazide, allopurinol, and potassium chloride SA.  Allergies as of 01/25/2022       Reactions   Prednisone Other (  See Comments)   Elevated blood sugar   Amoxicillin Itching, Rash   Tetanus Toxoids Rash        Medication List        Accurate as of January 25, 2022 11:59 PM. If you have any questions, ask your nurse or doctor.          allopurinol 100 MG tablet Commonly known as: ZYLOPRIM TAKE 1 TABLET EVERY DAY   aspirin 81 MG tablet Take 81 mg by mouth daily.   blood glucose meter kit and supplies 1 each by Other route as directed. Dispense ONE TOUCH ULTRA 2. Use once per day fasting. (FOR ICD-10 E10.9, E11.9).   CALTRATE 600+D PO Take 1 tablet by mouth 2 (two) times daily.   diclofenac Sodium 1 % Gel Commonly known as: VOLTAREN Apply 2 g topically 4 (four) times daily.   fosinopril 40 MG tablet Commonly  known as: MONOPRIL TAKE ONE TABLET ONCE DAILY   gabapentin 300 MG capsule Commonly known as: NEURONTIN TAKE ONE CAPSULE AT BEDTIME   halobetasol 0.05 % cream Commonly known as: ULTRAVATE Apply topically 2 (two) times daily.   hydrochlorothiazide 12.5 MG tablet Commonly known as: HYDRODIURIL Take 1 tablet (12.5 mg total) by mouth in the morning. For blood pressure/ swelling   meclizine 25 MG tablet Commonly known as: ANTIVERT TAKE (1) TABLET THREE TIMES DAILY AS NEEDED DIZZINESS   metFORMIN 1000 MG tablet Commonly known as: GLUCOPHAGE Take 0.5 tablets (500 mg total) by mouth daily with breakfast. *Dose change   OneTouch Delica Plus [id number] Misc CHECK BLOOD SUGAR ONCE DAILY OR AS DIRECTED Dx E11.42 (one touch ultra 2)   OneTouch Ultra test strip Generic drug: glucose blood CHECK BLOOD SUGAR ONCE A DAY Dx E11.9   potassium chloride SA 20 MEQ tablet Commonly known as: KLOR-CON M Take 1 tablet (20 mEq total) by mouth daily. Come in for labs 01/31/22.   raloxifene 60 MG tablet Commonly known as: EVISTA TAKE 1 TABLET DAILY   rosuvastatin 10 MG tablet Commonly known as: CRESTOR TAKE 1/2 TABLET DAILY   Vitamin D3 25 MCG (1000 UT) Caps Take 1 tablet by mouth daily.         Follow-up: Pt. Sent to E.D. CV Stable, so went with [place] nephew who will drive her.  Claretta Fraise, M.D.

## 2022-01-31 ENCOUNTER — Other Ambulatory Visit: Payer: PPO

## 2022-01-31 DIAGNOSIS — E876 Hypokalemia: Secondary | ICD-10-CM

## 2022-02-01 LAB — BASIC METABOLIC PANEL
BUN/Creatinine Ratio: 9 — ABNORMAL LOW (ref 12–28)
BUN: 10 mg/dL (ref 10–36)
CO2: 22 mmol/L (ref 20–29)
Calcium: 8.9 mg/dL (ref 8.7–10.3)
Chloride: 99 mmol/L (ref 96–106)
Creatinine, Ser: 1.14 mg/dL — ABNORMAL HIGH (ref 0.57–1.00)
Glucose: 187 mg/dL — ABNORMAL HIGH (ref 70–99)
Potassium: 3.8 mmol/L (ref 3.5–5.2)
Sodium: 141 mmol/L (ref 134–144)
eGFR: 46 mL/min/{1.73_m2} — ABNORMAL LOW (ref >59)

## 2022-02-09 DIAGNOSIS — Z7982 Long term (current) use of aspirin: Secondary | ICD-10-CM | POA: Diagnosis not present

## 2022-02-09 DIAGNOSIS — K5790 Diverticulosis of intestine, part unspecified, without perforation or abscess without bleeding: Secondary | ICD-10-CM | POA: Diagnosis not present

## 2022-02-09 DIAGNOSIS — Z9181 History of falling: Secondary | ICD-10-CM | POA: Diagnosis not present

## 2022-02-09 DIAGNOSIS — E1142 Type 2 diabetes mellitus with diabetic polyneuropathy: Secondary | ICD-10-CM | POA: Diagnosis not present

## 2022-02-09 DIAGNOSIS — N1831 Chronic kidney disease, stage 3a: Secondary | ICD-10-CM | POA: Diagnosis not present

## 2022-02-09 DIAGNOSIS — H409 Unspecified glaucoma: Secondary | ICD-10-CM | POA: Diagnosis not present

## 2022-02-09 DIAGNOSIS — Z7984 Long term (current) use of oral hypoglycemic drugs: Secondary | ICD-10-CM | POA: Diagnosis not present

## 2022-02-09 DIAGNOSIS — E1122 Type 2 diabetes mellitus with diabetic chronic kidney disease: Secondary | ICD-10-CM | POA: Diagnosis not present

## 2022-02-09 DIAGNOSIS — E1159 Type 2 diabetes mellitus with other circulatory complications: Secondary | ICD-10-CM | POA: Diagnosis not present

## 2022-02-09 DIAGNOSIS — M8580 Other specified disorders of bone density and structure, unspecified site: Secondary | ICD-10-CM | POA: Diagnosis not present

## 2022-02-09 DIAGNOSIS — E785 Hyperlipidemia, unspecified: Secondary | ICD-10-CM | POA: Diagnosis not present

## 2022-02-09 DIAGNOSIS — I152 Hypertension secondary to endocrine disorders: Secondary | ICD-10-CM | POA: Diagnosis not present

## 2022-02-09 DIAGNOSIS — M1030 Gout due to renal impairment, unspecified site: Secondary | ICD-10-CM | POA: Diagnosis not present

## 2022-02-20 ENCOUNTER — Encounter: Payer: Self-pay | Admitting: Internal Medicine

## 2022-02-20 ENCOUNTER — Ambulatory Visit: Payer: PPO | Attending: Internal Medicine

## 2022-02-20 ENCOUNTER — Telehealth: Payer: Self-pay | Admitting: Internal Medicine

## 2022-02-20 ENCOUNTER — Other Ambulatory Visit: Payer: Self-pay | Admitting: Internal Medicine

## 2022-02-20 ENCOUNTER — Ambulatory Visit: Payer: PPO | Attending: Internal Medicine | Admitting: Internal Medicine

## 2022-02-20 VITALS — BP 176/80 | HR 90 | Ht 61.0 in | Wt 140.6 lb

## 2022-02-20 DIAGNOSIS — I4719 Other supraventricular tachycardia: Secondary | ICD-10-CM | POA: Diagnosis not present

## 2022-02-20 DIAGNOSIS — M7989 Other specified soft tissue disorders: Secondary | ICD-10-CM

## 2022-02-20 DIAGNOSIS — I491 Atrial premature depolarization: Secondary | ICD-10-CM

## 2022-02-20 DIAGNOSIS — R002 Palpitations: Secondary | ICD-10-CM

## 2022-02-20 MED ORDER — METOPROLOL TARTRATE 25 MG PO TABS: 25.0000 mg | ORAL_TABLET | Freq: Every day | ORAL | 3 refills | Status: DC | PRN

## 2022-02-20 MED ORDER — FUROSEMIDE 20 MG PO TABS: 20.0000 mg | ORAL_TABLET | Freq: Every day | ORAL | 3 refills | Status: DC

## 2022-02-20 NOTE — Telephone Encounter (Signed)
PERCERT: ? ?LONG TERM MONITOR  ?

## 2022-02-20 NOTE — Patient Instructions (Addendum)
Medication Instructions:  Your physician has recommended you make the following change in your medication:  Stop aspirin Stop hydrochlorothiazide Start furosemide 20 mg daily Start metoprolol tartrate 25 mg daily as needed for palpitations Continue other medications the same  Labwork: none  Testing/Procedures: Your physician has requested that you have an echocardiogram. Echocardiography is a painless test that uses sound waves to create images of your heart. It provides your doctor with information about the size and shape of your heart and how well your heart's chambers and valves are working. This procedure takes approximately one hour. There are no restrictions for this procedure. Please do NOT wear cologne, perfume, aftershave, or lotions (deodorant is allowed). Please arrive 15 minutes prior to your appointment time. Your physician has recommended that you wear a Zio monitor.   This monitor is a medical device that records the heart's electrical activity. Doctors most often use these monitors to diagnose arrhythmias. Arrhythmias are problems with the speed or rhythm of the heartbeat. The monitor is a small device applied to your chest. You can wear one while you do your normal daily activities. While wearing this monitor if you have any symptoms to push the button and record what you felt. Once you have worn this monitor for the period of time provider prescribed (for 7 days), you will return the monitor device in the postage paid box. Once it is returned they will download the data collected and provide [place] with a report which the provider will then review and we will call you with those results. Important tips:  Avoid showering during the first 24 hours of wearing the monitor. Avoid excessive sweating to help maximize wear time. Do not submerge the device, no hot tubs, and no swimming pools. Keep any lotions or oils away from the patch. After 24 hours you may shower with the patch on.  Take brief showers with your back facing the shower head.  Do not remove patch once it has been placed because that will interrupt data and decrease adhesive wear time. Push the button when you have any symptoms and write down what you were feeling. Once you have completed wearing your monitor, remove and place into box which has postage paid and place in your outgoing mailbox.  If for some reason you have misplaced your box then call our office and we can provide another box and/or mail it off for you.  Follow-Up: Your physician recommends that you schedule a follow-up appointment in: 6 months  Any Other Special Instructions Will Be Listed Below (If Applicable).  If you need a refill on your cardiac medications before your next appointment, please call your pharmacy.

## 2022-02-21 DIAGNOSIS — M7989 Other specified soft tissue disorders: Secondary | ICD-10-CM | POA: Insufficient documentation

## 2022-02-21 DIAGNOSIS — I4719 Other supraventricular tachycardia: Secondary | ICD-10-CM | POA: Insufficient documentation

## 2022-02-21 DIAGNOSIS — I491 Atrial premature depolarization: Secondary | ICD-10-CM | POA: Insufficient documentation

## 2022-02-21 NOTE — Progress Notes (Signed)
Cardiology Office Note  Date: 02/21/2022   ID: Sara Holmes, DOB [birth year], MRN [id number]  PCP:  Janora Norlander, DO  Cardiologist:  Chalmers Guest, MD Electrophysiologist:  None   Reason for Office Visit: Evaluation of palpitations at the request of Dr. Lajuana Ripple   History of Present Illness: Sara Holmes is a 88 y.o. female known to have HTN, DM 2, HLD, vertigo was referred to cardiology clinic for evaluation of palpitations. Accompanied by nephew.  Patient was sent from primary care clinic to the [facility] on 01/25/2022 for evaluation of atrial fibrillation. Patient had PCP checkup for irregular heartbeats and dizziness, EKG was performed and showed atrial fibrillation (per EKG report but the baseline artifact made the EKG uninterpretable) and was sent to the ER.  In the ER, multiple EKGs were performed which showed normal sinus rhythm and PACs. Her labs were remarkable for hypokalemia and hypomagnesemia which were repleted. Cardiology was consulted in the ER who recommended outpatient echo and Holter monitor and no indication for anticoagulation as it was not A-fib. Upon discharge from the ER, patient denied having any irregular heartbeat, palpitations and dizziness. Nephew said that she was not eating enough food prior to the ER visit but he is making sure she gets adequate nutrition.  Patient was having adequate nutrition and water intake after ER visit. Upon arrival to the cardiology clinic today (02/20/2022), EKG showed SVT (AVNRT) with spontaneously converted to normal sinus rhythm. Patient stated that she was nervous for her cardiology appointment. When she was in SVT/AVNRT, she experienced palpitations.  Denied any angina, DOE, palpitations, dizziness upon discharge from the ER.  She reported having bilateral lower extremity swelling for quite some time.  Currently on aspirin 81 mg once daily for primary prevention of CAD. Otherwise, takes HCTZ,  fosinopril for HTN management.   Past Medical History:  Diagnosis Date   Diabetes mellitus without complication ([place])    Diverticulosis    Glaucoma    Gout    Hyperlipidemia    Hypertension    Osteopenia    Plantar fasciitis    Sinus bradycardia    Vertigo     Past Surgical History:  Procedure Laterality Date   ABDOMINAL HYSTERECTOMY     CHOLECYSTECTOMY     EYE SURGERY     cataracts   NASAL SEPTUM SURGERY      Current Outpatient Medications  Medication Sig Dispense Refill   allopurinol (ZYLOPRIM) 100 MG tablet TAKE 1 TABLET EVERY DAY 90 tablet 3   blood glucose meter kit and supplies 1 each by Other route as directed. Dispense ONE TOUCH ULTRA 2. Use once per day fasting. (FOR ICD-10 E10.9, E11.9). 1 each 0   Calcium Carbonate-Vitamin D (CALTRATE 600+D PO) Take 1 tablet by mouth 2 (two) times daily.     Cholecalciferol (VITAMIN D3) 1000 UNITS CAPS Take 1 tablet by mouth daily.     diclofenac Sodium (VOLTAREN) 1 % GEL Apply 2 g topically 4 (four) times daily. 50 g 1   fosinopril (MONOPRIL) 40 MG tablet TAKE ONE TABLET ONCE DAILY 90 tablet 1   furosemide (LASIX) 20 MG tablet Take 1 tablet (20 mg total) by mouth daily. 30 tablet 3   gabapentin (NEURONTIN) 300 MG capsule TAKE ONE CAPSULE AT BEDTIME 90 capsule 0   glucose blood (ONETOUCH ULTRA) test strip CHECK BLOOD SUGAR ONCE A DAY Dx E11.9 100 strip 3   halobetasol (ULTRAVATE) 0.05 % cream Apply topically 2 (two)  times daily.     Lancets (ONETOUCH DELICA PLUS [id number]) MISC CHECK BLOOD SUGAR ONCE DAILY OR AS DIRECTED Dx E11.42 (one touch ultra 2) 100 each 3   meclizine (ANTIVERT) 25 MG tablet TAKE (1) TABLET THREE TIMES DAILY AS NEEDED DIZZINESS 30 tablet 0   metFORMIN (GLUCOPHAGE) 1000 MG tablet Take 0.5 tablets (500 mg total) by mouth daily with breakfast. *Dose change 45 tablet 3   metoprolol tartrate (LOPRESSOR) 25 MG tablet Take 1 tablet (25 mg total) by mouth daily as needed (palpitations). 30 tablet 3   potassium  chloride SA (KLOR-CON M) 20 MEQ tablet Take 1 tablet (20 mEq total) by mouth daily. Come in for labs 01/31/22. 30 tablet 3   raloxifene (EVISTA) 60 MG tablet TAKE 1 TABLET DAILY 90 tablet 0   rosuvastatin (CRESTOR) 10 MG tablet TAKE 1/2 TABLET DAILY 45 tablet 0   No current facility-administered medications for this visit.   Allergies:  Prednisone, Amoxicillin, and Tetanus toxoids   Social History: The patient  reports that she has never smoked. She has never used smokeless tobacco. She reports that she does not drink alcohol and does not use drugs.   Family History: The patient's family history includes Cancer in her father; Heart disease in her mother; Hypertension in her brother and mother.   ROS:  Please see the history of present illness. Otherwise, complete review of systems is positive for none.  All other systems are reviewed and negative.   Physical Exam: VS:  BP (!) 176/80 (BP Location: Right Arm, Cuff Size: Normal)   Pulse 90   Ht _0  (1.549 m)   Wt 140 lb 9.6 oz (63.8 kg)   SpO2 95%   BMI 26.57 kg/m , BMI Body mass index is 26.57 kg/m.  Wt Readings from Last 3 Encounters:  02/20/22 140 lb 9.6 oz (63.8 kg)  01/25/22 143 lb (64.9 kg)  01/08/22 153 lb (69.4 kg)    General: Patient appears comfortable at rest. HEENT: Conjunctiva and lids normal, oropharynx clear with moist mucosa. Neck: Supple, no elevated JVP or carotid bruits, no thyromegaly. Lungs: Clear to auscultation, nonlabored breathing at rest. Cardiac: Regular rate and rhythm, no S3 or significant systolic murmur, no pericardial rub. Abdomen: Soft, nontender, no hepatomegaly, bowel sounds present, no guarding or rebound. Extremities: 2+ pitting edema in bilateral lower extremities Skin: Warm and dry. Musculoskeletal: No kyphosis. Neuropsychiatric: Alert and oriented x3, affect grossly appropriate.  ECG: EKG on 02/20/2022 showed SVT (AVNRT) which spontaneously converted to normal sinus rhythm.  Subsequent  EKG showed normal sinus rhythm.  Recent Labwork: 10/17/2021: ALT 9; AST 20 01/25/2022: B Natriuretic Peptide 661.9; Hemoglobin 11.2; Magnesium 1.3; Platelets 239 01/31/2022: BUN 10; Creatinine, Ser 1.14; Potassium 3.8; Sodium 141     Component Value Date/Time   CHOL 139 10/17/2021 0911   CHOL 122 08/06/2012 0849   TRIG 100 10/17/2021 0911   TRIG 130 07/26/2014 0844   TRIG 124 08/06/2012 0849   HDL 76 10/17/2021 0911   HDL 56 07/26/2014 0844   HDL 46 08/06/2012 0849   CHOLHDL 1.8 10/17/2021 0911   LDLCALC 45 10/17/2021 0911   LDLCALC 53 01/01/2014 0837   LDLCALC 51 08/06/2012 0849    Other Studies Reviewed Today: I reviewed all the EKGs on the chart which showed normal sinus rhythm and PACs. I reviewed the ER notes from 01/25/2022 There was a EKG from 01/25/2022 which showed atrial fibrillation per EKG report but the baseline artifact made the EKG uninterpretable and  hence the rhythm was not known clearly.  Assessment and Plan: Patient is a 88 year old F known to have HTN, HLD, DM 2 was referred to cardiology clinic for evaluation of palpitations.  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 Try vagal maneuvers for palpitations. If refractory, start metoprolol tartrate 25 mg daily as needed for palpitations.  # Frequent PACs on EKG from 01/25/22 -EKG from 01/25/2022 showed normal sinus rhythm with frequent PACs. Likely secondary to atrial abnormalities, hypokalemia and hypomagnesemia from 01/25/2022.  Will obtain 1 week event monitor to quantify the burden of PAC.  # HTN, controlled -Patient had elevated Bps in the clinic visit but stay normal at home. -Stop HCTZ (due to worsening GFR), start Lasix 23m once daily, continue fosinopril 432monce daily.  # Bilateral lower extremity swelling, rule out structural heart disease -Obtain 2D echocardiogram -Stop HCTZ (due  to worsening GFR) and start Lasix 20 mg once daily. Continue potassium supplementation with 20 mEq once daily. Recommended dietary sources rich in potassium like banana, coconut water etc.,  # Primary prevention of CAD -Patient has no ASCVD. Stop aspirin. Patient voiced understanding and agreed to stop aspirin.  I have spent a total of 45 minutes with patient reviewing chart, EKGs, labs and examining patient as well as establishing an assessment and plan that was discussed with the patient.  > 50% of time was spent in direct patient care.      Medication Adjustments/Labs and Tests Ordered: Current medicines are reviewed at length with the patient today.  Concerns regarding medicines are outlined above.   Tests Ordered: Orders Placed This Encounter  Procedures   EKG 12-Lead   ECHOCARDIOGRAM COMPLETE    Medication Changes: Meds ordered this encounter  Medications   metoprolol tartrate (LOPRESSOR) 25 MG tablet    Sig: Take 1 tablet (25 mg total) by mouth daily as needed (palpitations).    Dispense:  30 tablet    Refill:  3    02/20/2022 NEW   furosemide (LASIX) 20 MG tablet    Sig: Take 1 tablet (20 mg total) by mouth daily.    Dispense:  30 tablet    Refill:  3    02/20/2022 NEW-stop hctz    Disposition:  Follow up  6 months  Signed Kaitlan Bin PrFidel LevyMD, 02/21/2022 8:53 AM    [place] [id number]

## 2022-02-26 ENCOUNTER — Other Ambulatory Visit: Payer: Self-pay | Admitting: Family Medicine

## 2022-02-26 DIAGNOSIS — E1142 Type 2 diabetes mellitus with diabetic polyneuropathy: Secondary | ICD-10-CM

## 2022-03-07 ENCOUNTER — Other Ambulatory Visit: Payer: Self-pay | Admitting: Internal Medicine

## 2022-03-07 DIAGNOSIS — I491 Atrial premature depolarization: Secondary | ICD-10-CM

## 2022-03-07 DIAGNOSIS — M7989 Other specified soft tissue disorders: Secondary | ICD-10-CM

## 2022-03-07 DIAGNOSIS — R002 Palpitations: Secondary | ICD-10-CM

## 2022-03-07 DIAGNOSIS — I4719 Other supraventricular tachycardia: Secondary | ICD-10-CM

## 2022-03-09 ENCOUNTER — Other Ambulatory Visit: Payer: PPO

## 2022-03-11 DIAGNOSIS — R002 Palpitations: Secondary | ICD-10-CM | POA: Diagnosis not present

## 2022-03-13 ENCOUNTER — Telehealth: Payer: Self-pay | Admitting: Internal Medicine

## 2022-03-13 MED ORDER — METOPROLOL TARTRATE 25 MG PO TABS: 25.0000 mg | ORAL_TABLET | Freq: Two times a day (BID) | ORAL | 1 refills | Status: DC

## 2022-03-13 NOTE — Telephone Encounter (Signed)
Sara Holmes w/[facility] called to report HR 199 BPM at 60 seconds on 02/24/2022 '@10'$ :27 am pg 19 of report.

## 2022-03-13 NOTE — Telephone Encounter (Signed)
Patient informed and verbalized understanding of plan. 

## 2022-03-13 NOTE — Telephone Encounter (Signed)
[facility] called for abnormal zio monitor results. Sent to triage

## 2022-03-16 ENCOUNTER — Ambulatory Visit: Payer: PPO | Attending: Internal Medicine

## 2022-03-16 DIAGNOSIS — I4719 Other supraventricular tachycardia: Secondary | ICD-10-CM | POA: Diagnosis not present

## 2022-03-16 DIAGNOSIS — I491 Atrial premature depolarization: Secondary | ICD-10-CM

## 2022-03-16 DIAGNOSIS — M7989 Other specified soft tissue disorders: Secondary | ICD-10-CM

## 2022-03-16 LAB — ECHOCARDIOGRAM COMPLETE
AR max vel: 1.67 cm2
AV Peak grad: 7.5 mmHg
AV Vena cont: 0.2 cm
Ao pk vel: 1.37 m/s
Area-P 1/2: 3.3 cm2
Calc EF: 70 %
MV M vel: 4.81 m/s
MV Peak grad: 92.4 mmHg
S' Lateral: 3.3 cm
Single Plane A2C EF: 75.2 %
Single Plane A4C EF: 63 %

## 2022-03-27 ENCOUNTER — Other Ambulatory Visit: Payer: Self-pay | Admitting: Family Medicine

## 2022-03-27 DIAGNOSIS — M8580 Other specified disorders of bone density and structure, unspecified site: Secondary | ICD-10-CM

## 2022-03-27 DIAGNOSIS — E1169 Type 2 diabetes mellitus with other specified complication: Secondary | ICD-10-CM

## 2022-04-10 ENCOUNTER — Other Ambulatory Visit: Payer: Self-pay | Admitting: Family Medicine

## 2022-04-10 DIAGNOSIS — N1831 Chronic kidney disease, stage 3a: Secondary | ICD-10-CM

## 2022-04-23 ENCOUNTER — Ambulatory Visit: Payer: PPO | Admitting: Internal Medicine

## 2022-04-25 ENCOUNTER — Ambulatory Visit: Payer: PPO | Attending: Internal Medicine | Admitting: Internal Medicine

## 2022-04-25 ENCOUNTER — Encounter: Payer: Self-pay | Admitting: Internal Medicine

## 2022-04-25 ENCOUNTER — Telehealth: Payer: Self-pay | Admitting: Internal Medicine

## 2022-04-25 VITALS — BP 175/75 | HR 56 | Ht 62.0 in | Wt 141.2 lb

## 2022-04-25 DIAGNOSIS — I491 Atrial premature depolarization: Secondary | ICD-10-CM | POA: Diagnosis not present

## 2022-04-25 DIAGNOSIS — I340 Nonrheumatic mitral (valve) insufficiency: Secondary | ICD-10-CM | POA: Diagnosis not present

## 2022-04-25 NOTE — Telephone Encounter (Signed)
Provider switch approved with both providers

## 2022-04-25 NOTE — Progress Notes (Signed)
Cardiology Office Note  Date: 04/25/2022   ID: Sara Holmes, DOB 01/21/1929, MRN [id number]  PCP:  Janora Norlander, DO  Cardiologist:  Chalmers Guest, MD Electrophysiologist:  None   Reason for Office Visit: Follow-up of AVNRT and PAC burden   History of Present Illness: Sara Holmes is a 89 y.o. female known to have HTN, DM 2, HLD, AVNRT, PAC burden 13.9% presented to cardiology clinic for follow-up visit. Accompanied by nephew.  Patient was sent from primary care clinic to the [facility] ER on 01/25/2022 for evaluation of atrial fibrillation. Patient had PCP checkup for irregular heartbeats and dizziness, EKG was performed and showed atrial fibrillation (per EKG report but the baseline artifact made the EKG uninterpretable) and was sent to the ER. In the ER, multiple EKGs were performed which showed normal sinus rhythm and PACs. Her labs were remarkable for hypokalemia and hypomagnesemia which were repleted. Cardiology was consulted in the ER who recommended outpatient echo and Holter monitor and no indication for anticoagulation as it was not A-fib. Upon discharge from the ER, patient denied having any irregular heartbeat, palpitations and dizziness. Nephew said that she was not eating enough food prior to the ER visit but he is making sure she gets adequate nutrition.  Patient was having adequate nutrition and water intake after ER visit. Upon arrival to the cardiology clinic today on 02/20/2022), EKG showed SVT (AVNRT) with spontaneously converted to normal sinus rhythm. Patient stated that she was nervous for her cardiology appointment. When she was in SVT/AVNRT, she experienced palpitations.  She subsequently underwent 1 week event monitor on 03/13/2022 that showed normal sinus rhythm with evidence of 638 runs of SVT with the fastest interval lasting 3 mins 21 secs and longest lasting 18 mins 35 secs. These SVT runs are most likely AVNRT but there is a brief  irregular SVT which is likely atrial fibrillation. Significant PAC burden of 13.9%. Symptoms correlated with PAC and SVT episodes. Echocardiogram from 03/2022 showed normal LVEF and mild mitral valve regurgitation.  She is here for follow-up visit, commended by nephew.  Denies any palpitations, takes metoprolol tartrate 25 mg once a day and start twice a day, denies any chest pain, SOB, dizziness and syncope.  Has chronic bilateral lower extremity swelling for which she was told to elevate her legs but she is not doing it/she says she needs help with wearing compression socks and does not have anyone at home.   Past Medical History:  Diagnosis Date   Diabetes mellitus without complication ([place])    Diverticulosis    Glaucoma    Gout    Hyperlipidemia    Hypertension    Osteopenia    Plantar fasciitis    Sinus bradycardia    Vertigo     Past Surgical History:  Procedure Laterality Date   ABDOMINAL HYSTERECTOMY     CHOLECYSTECTOMY     EYE SURGERY     cataracts   NASAL SEPTUM SURGERY      Current Outpatient Medications  Medication Sig Dispense Refill   allopurinol (ZYLOPRIM) 100 MG tablet TAKE 1 TABLET EVERY DAY 90 tablet 3   blood glucose meter kit and supplies 1 each by Other route as directed. Dispense ONE TOUCH ULTRA 2. Use once per day fasting. (FOR ICD-10 E10.9, E11.9). 1 each 0   Calcium Carbonate-Vitamin D (CALTRATE 600+D PO) Take 1 tablet by mouth 2 (two) times daily.     Cholecalciferol (VITAMIN D3) 1000 UNITS CAPS  Take 1 tablet by mouth daily.     diclofenac Sodium (VOLTAREN) 1 % GEL Apply 2 g topically 4 (four) times daily. 50 g 1   fosinopril (MONOPRIL) 40 MG tablet TAKE ONE TABLET ONCE DAILY 90 tablet 1   furosemide (LASIX) 20 MG tablet Take 1 tablet (20 mg total) by mouth daily. 30 tablet 3   gabapentin (NEURONTIN) 300 MG capsule TAKE ONE CAPSULE AT BEDTIME 90 capsule 0   glucose blood (ONETOUCH ULTRA) test strip CHECK BLOOD SUGAR ONCE A DAY Dx E11.9 100 strip 3    halobetasol (ULTRAVATE) 0.05 % cream Apply topically 2 (two) times daily.     Lancets (ONETOUCH DELICA PLUS [id number]) MISC CHECK BLOOD SUGAR ONCE DAILY OR AS DIRECTED Dx E11.42 (one touch ultra 2) 100 each 3   meclizine (ANTIVERT) 25 MG tablet TAKE (1) TABLET THREE TIMES DAILY AS NEEDED DIZZINESS 30 tablet 0   metFORMIN (GLUCOPHAGE) 1000 MG tablet Take 1 tablet (1,000 mg total) by mouth daily with breakfast. (NEEDS TO BE SEEN BEFORE NEXT REFILL) 15 tablet 0   metoprolol tartrate (LOPRESSOR) 25 MG tablet Take 1 tablet (25 mg total) by mouth 2 (two) times daily. (Patient taking differently: Take 25 mg by mouth daily.) 180 tablet 1   potassium chloride SA (KLOR-CON M) 20 MEQ tablet Take 1 tablet (20 mEq total) by mouth daily. Come in for labs 01/31/22. 30 tablet 3   raloxifene (EVISTA) 60 MG tablet TAKE 1 TABLET DAILY 90 tablet 0   rosuvastatin (CRESTOR) 10 MG tablet TAKE 1/2 TABLET DAILY 45 tablet 0   No current facility-administered medications for this visit.   Allergies:  Prednisone, Amoxicillin, and Tetanus toxoids   Social History: The patient  reports that she has never smoked. She has never used smokeless tobacco. She reports that she does not drink alcohol and does not use drugs.   Family History: The patient's family history includes Cancer in her father; Heart disease in her mother; Hypertension in her brother and mother.   ROS:  Please see the history of present illness. Otherwise, complete review of systems is positive for none.  All other systems are reviewed and negative.   Physical Exam: VS:  BP (!) 175/75   Pulse (!) 56   Ht 5' 2"$  (1.575 m)   Wt 141 lb 3.2 oz (64 kg)   SpO2 96%   BMI 25.83 kg/m , BMI Body mass index is 25.83 kg/m.  Wt Readings from Last 3 Encounters:  04/25/22 141 lb 3.2 oz (64 kg)  02/20/22 140 lb 9.6 oz (63.8 kg)  01/25/22 143 lb (64.9 kg)    General: Patient appears comfortable at rest. HEENT: Conjunctiva and lids normal, oropharynx clear with  moist mucosa. Neck: Supple, no elevated JVP or carotid bruits, no thyromegaly. Lungs: Clear to auscultation, nonlabored breathing at rest. Cardiac: Regular rate and rhythm, no S3 or significant systolic murmur, no pericardial rub. Abdomen: Soft, nontender, no hepatomegaly, bowel sounds present, no guarding or rebound. Extremities: 2+ pitting edema in bilateral lower extremities Skin: Warm and dry. Musculoskeletal: No kyphosis. Neuropsychiatric: Alert and oriented x3, affect grossly appropriate.  ECG: EKG on 02/20/2022 showed SVT (AVNRT) which spontaneously converted to normal sinus rhythm.  Subsequent EKG showed normal sinus rhythm.  Recent Labwork: 10/17/2021: ALT 9; AST 20 01/25/2022: B Natriuretic Peptide 661.9; Hemoglobin 11.2; Magnesium 1.3; Platelets 239 01/31/2022: BUN 10; Creatinine, Ser 1.14; Potassium 3.8; Sodium 141     Component Value Date/Time   CHOL 139 10/17/2021 0911  CHOL 122 08/06/2012 0849   TRIG 100 10/17/2021 0911   TRIG 130 07/26/2014 0844   TRIG 124 08/06/2012 0849   HDL 76 10/17/2021 0911   HDL 56 07/26/2014 0844   HDL 46 08/06/2012 0849   CHOLHDL 1.8 10/17/2021 0911   LDLCALC 45 10/17/2021 0911   LDLCALC 53 01/01/2014 0837   LDLCALC 51 08/06/2012 0849    Other Studies Reviewed Today: Echocardiogram from 03/16/2022 Normal LVEF Mild MR  Event monitor from 03/13/2022 Normal sinus rhythm with evidence of 638 runs of SVT with the fastest interval lasting 3 mins 21 secs and longest lasting 18 mins 35 secs. These SVT runs are most likely AVNRT but there is a brief irregular SVT which is likely atrial fibrillation. Significant PAC burden of 13.9%. Symptoms correlated with PAC and SVT episodes.   Assessment and Plan: Patient is a 89 year old F known to have HTN, HLD, DM 2, AVNRT, PAC burden 13.9% presented to cardiology clinic for follow-up visit.  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 There was significant PAC burden, 13.9%. Symptoms correlated with PACs and SVT episodes. The atrial fibrillation episode was brief and does not warrant systemic anticoagulation. -Continue metoprolol tartarate 25 mg twice daily, but patient is taking once a day and has significant relief in her palpitations. Denies any new palpitations despite taking 25 mg once a day.  # HTN, controlled -Continue Lasix 20 mg once daily, lisinopril 40 mg once daily  # Bilateral lower extremity swelling likely secondary to chronic venous insufficiency -Elevate bilateral lower extremities  # Mild mitral regurgitation per echocardiogram from 03/2022 -Repeat echocardiogram in 3 years next 1 will be in 2027.  I have spent a total of 30 minutes with patient reviewing chart, EKGs, labs and examining patient as well as establishing an assessment and plan that was discussed with the patient.  > 50% of time was spent in direct patient care.      Medication Adjustments/Labs and Tests Ordered: Current medicines are reviewed at length with the patient today.  Concerns regarding medicines are outlined above.   Tests Ordered: Orders Placed This Encounter  Procedures   EKG 12-Lead    Medication Changes: No orders of the defined types were placed in this encounter.   Disposition:  Follow up  6 months  Signed Sommer Spickard Fidel Levy, MD, 04/25/2022 2:05 PM    [facility], [place], [place] [address]

## 2022-04-25 NOTE — Patient Instructions (Addendum)

## 2022-04-26 ENCOUNTER — Ambulatory Visit: Payer: PPO | Admitting: Internal Medicine

## 2022-04-30 ENCOUNTER — Telehealth: Payer: Self-pay | Admitting: Family Medicine

## 2022-04-30 DIAGNOSIS — E1169 Type 2 diabetes mellitus with other specified complication: Secondary | ICD-10-CM

## 2022-04-30 DIAGNOSIS — E1159 Type 2 diabetes mellitus with other circulatory complications: Secondary | ICD-10-CM

## 2022-04-30 DIAGNOSIS — E1122 Type 2 diabetes mellitus with diabetic chronic kidney disease: Secondary | ICD-10-CM

## 2022-04-30 NOTE — Telephone Encounter (Signed)
Patient's caregiver aware ok to come in for labs.  Future order placed.

## 2022-05-01 ENCOUNTER — Encounter: Payer: Self-pay | Admitting: Internal Medicine

## 2022-05-02 ENCOUNTER — Other Ambulatory Visit: Payer: PPO

## 2022-05-02 DIAGNOSIS — E785 Hyperlipidemia, unspecified: Secondary | ICD-10-CM | POA: Diagnosis not present

## 2022-05-02 DIAGNOSIS — I152 Hypertension secondary to endocrine disorders: Secondary | ICD-10-CM | POA: Diagnosis not present

## 2022-05-02 DIAGNOSIS — E1159 Type 2 diabetes mellitus with other circulatory complications: Secondary | ICD-10-CM | POA: Diagnosis not present

## 2022-05-02 DIAGNOSIS — E1122 Type 2 diabetes mellitus with diabetic chronic kidney disease: Secondary | ICD-10-CM | POA: Diagnosis not present

## 2022-05-02 DIAGNOSIS — N1831 Type 2 diabetes mellitus with diabetic chronic kidney disease: Secondary | ICD-10-CM

## 2022-05-02 DIAGNOSIS — E1169 Type 2 diabetes mellitus with other specified complication: Secondary | ICD-10-CM | POA: Diagnosis not present

## 2022-05-02 LAB — BAYER DCA HB A1C WAIVED: HB A1C (BAYER DCA - WAIVED): 7.3 % — ABNORMAL HIGH (ref 4.8–5.6)

## 2022-05-03 LAB — CMP14+EGFR
ALT: 6 IU/L (ref 0–32)
AST: 17 IU/L (ref 0–40)
Albumin/Globulin Ratio: 2.3 — ABNORMAL HIGH (ref 1.2–2.2)
Albumin: 4.1 g/dL (ref 3.6–4.6)
Alkaline Phosphatase: 58 IU/L (ref 44–121)
BUN/Creatinine Ratio: 16 (ref 12–28)
BUN: 16 mg/dL (ref 10–36)
Bilirubin Total: 0.8 mg/dL (ref 0.0–1.2)
CO2: 26 mmol/L (ref 20–29)
Calcium: 9.2 mg/dL (ref 8.7–10.3)
Chloride: 101 mmol/L (ref 96–106)
Creatinine, Ser: 1.02 mg/dL — ABNORMAL HIGH (ref 0.57–1.00)
Globulin, Total: 1.8 g/dL (ref 1.5–4.5)
Glucose: 142 mg/dL — ABNORMAL HIGH (ref 70–99)
Potassium: 3.9 mmol/L (ref 3.5–5.2)
Sodium: 142 mmol/L (ref 134–144)
Total Protein: 5.9 g/dL — ABNORMAL LOW (ref 6.0–8.5)
eGFR: 52 mL/min/{1.73_m2} — ABNORMAL LOW (ref >59)

## 2022-05-03 LAB — CBC WITH DIFFERENTIAL/PLATELET
Basophils Absolute: 0 10*3/uL (ref 0.0–0.2)
Basos: 0 %
EOS (ABSOLUTE): 0.1 10*3/uL (ref 0.0–0.4)
Eos: 2 %
Hematocrit: 34.5 % (ref 34.0–46.6)
Hemoglobin: 11.2 g/dL (ref 11.1–15.9)
Immature Grans (Abs): 0 10*3/uL (ref 0.0–0.1)
Immature Granulocytes: 0 %
Lymphocytes Absolute: 2 10*3/uL (ref 0.7–3.1)
Lymphs: 29 %
MCH: 31.9 pg (ref 26.6–33.0)
MCHC: 32.5 g/dL (ref 31.5–35.7)
MCV: 98 fL — ABNORMAL HIGH (ref 79–97)
Monocytes Absolute: 0.6 10*3/uL (ref 0.1–0.9)
Monocytes: 9 %
Neutrophils Absolute: 4.1 10*3/uL (ref 1.4–7.0)
Neutrophils: 60 %
Platelets: 209 10*3/uL (ref 150–450)
RBC: 3.51 x10E6/uL — ABNORMAL LOW (ref 3.77–5.28)
RDW: 12.7 % (ref 11.7–15.4)
WBC: 6.8 10*3/uL (ref 3.4–10.8)

## 2022-05-03 LAB — LIPID PANEL
Chol/HDL Ratio: 2.1 ratio (ref 0.0–4.4)
Cholesterol, Total: 133 mg/dL (ref 100–199)
HDL: 63 mg/dL (ref >39)
LDL Chol Calc (NIH): 54 mg/dL (ref 0–99)
Triglycerides: 82 mg/dL (ref 0–149)
VLDL Cholesterol Cal: 16 mg/dL (ref 5–40)

## 2022-05-04 ENCOUNTER — Ambulatory Visit (INDEPENDENT_AMBULATORY_CARE_PROVIDER_SITE_OTHER): Payer: PPO | Admitting: Family Medicine

## 2022-05-04 ENCOUNTER — Encounter: Payer: Self-pay | Admitting: Family Medicine

## 2022-05-04 VITALS — BP 158/88 | Ht 62.0 in | Wt 141.0 lb

## 2022-05-04 DIAGNOSIS — E785 Hyperlipidemia, unspecified: Secondary | ICD-10-CM | POA: Diagnosis not present

## 2022-05-04 DIAGNOSIS — E1122 Type 2 diabetes mellitus with diabetic chronic kidney disease: Secondary | ICD-10-CM

## 2022-05-04 DIAGNOSIS — N1831 Chronic kidney disease, stage 3a: Secondary | ICD-10-CM | POA: Diagnosis not present

## 2022-05-04 DIAGNOSIS — I152 Hypertension secondary to endocrine disorders: Secondary | ICD-10-CM | POA: Diagnosis not present

## 2022-05-04 DIAGNOSIS — E1159 Type 2 diabetes mellitus with other circulatory complications: Secondary | ICD-10-CM | POA: Diagnosis not present

## 2022-05-04 DIAGNOSIS — Z8631 Personal history of diabetic foot ulcer: Secondary | ICD-10-CM | POA: Diagnosis not present

## 2022-05-04 DIAGNOSIS — E1142 Type 2 diabetes mellitus with diabetic polyneuropathy: Secondary | ICD-10-CM | POA: Diagnosis not present

## 2022-05-04 DIAGNOSIS — E1169 Type 2 diabetes mellitus with other specified complication: Secondary | ICD-10-CM | POA: Diagnosis not present

## 2022-05-04 MED ORDER — METFORMIN HCL 500 MG PO TABS: 500.0000 mg | ORAL_TABLET | Freq: Every day | ORAL | 3 refills | Status: DC

## 2022-05-04 NOTE — Progress Notes (Signed)
Subjective: CC:DM PCP: Janora Norlander, DO [id number] Sara Holmes is a 89 y.o. female presenting to clinic today for:  1. Type 2 Diabetes with hypertension, hyperlipidemia associated with CKD 3A:  She reports compliance with her medications.  She only takes 1/2 tablet of the metformin to equal 500 mg.  No hypoglycemia reported.  She had 1 fall in December but none since that time.  Continues to take potassium for hypokalemia  Last eye exam: Due in March Last foot exam: Due in April Last A1c:  Lab Results  Component Value Date   HGBA1C 7.3 (H) 05/02/2022   Nephropathy screen indicated?: no Last flu, zoster and/or pneumovax:  Immunization History  Administered Date(s) Administered   Covid-19, Mrna,Vaccine(Spikevax)27yr and older 03/08/2022   Fluad Quad(high Dose 65+) 12/14/2019, 12/26/2020, 01/23/2022   Influenza Whole 12/28/2008   Influenza, High Dose Seasonal PF 12/11/2016, 12/20/2017   Influenza,inj,Quad PF,6+ Mos 12/03/2012, 01/01/2014, 12/31/2014, 11/28/2015   Influenza,inj,quad, With Preservative 12/03/2018   Influenza-Unspecified 12/04/2018   Moderna Covid-19 Vaccine Bivalent Booster 179yr& up 12/13/2020   Moderna Sars-Covid-2 Vaccination 05/11/2019, 06/08/2019, 01/26/2020, 06/21/2020   Pneumococcal Polysaccharide-23 03/31/2010   Zoster Recombinat (Shingrix) 11/07/2016, 04/01/2017   Zoster, Live 01/28/2007    ROS: Denies chest pain, shortness of breath, visual disturbance.   ROS: Per HPI  Allergies  Allergen Reactions   Prednisone Other (See Comments)    Elevated blood sugar   Amoxicillin Itching and Rash   Tetanus Toxoids Rash   Past Medical History:  Diagnosis Date   Diabetes mellitus without complication (HCC)    Diverticulosis    Glaucoma    Gout    Hyperlipidemia    Hypertension    Osteopenia    Plantar fasciitis    Sinus bradycardia    Vertigo     Current Outpatient Medications:    allopurinol (ZYLOPRIM) 100 MG tablet, TAKE 1 TABLET EVERY  DAY, Disp: 90 tablet, Rfl: 3   blood glucose meter kit and supplies, 1 each by Other route as directed. Dispense ONE TOUCH ULTRA 2. Use once per day fasting. (FOR ICD-10 E10.9, E11.9)., Disp: 1 each, Rfl: 0   Calcium Carbonate-Vitamin D (CALTRATE 600+D PO), Take 1 tablet by mouth 2 (two) times daily., Disp: , Rfl:    Cholecalciferol (VITAMIN D3) 1000 UNITS CAPS, Take 1 tablet by mouth daily., Disp: , Rfl:    diclofenac Sodium (VOLTAREN) 1 % GEL, Apply 2 g topically 4 (four) times daily., Disp: 50 g, Rfl: 1   fosinopril (MONOPRIL) 40 MG tablet, TAKE ONE TABLET ONCE DAILY, Disp: 90 tablet, Rfl: 1   furosemide (LASIX) 20 MG tablet, Take 1 tablet (20 mg total) by mouth daily., Disp: 30 tablet, Rfl: 3   gabapentin (NEURONTIN) 300 MG capsule, TAKE ONE CAPSULE AT BEDTIME, Disp: 90 capsule, Rfl: 0   glucose blood (ONETOUCH ULTRA) test strip, CHECK BLOOD SUGAR ONCE A DAY Dx E11.9, Disp: 100 strip, Rfl: 3   halobetasol (ULTRAVATE) 0.05 % cream, Apply topically 2 (two) times daily., Disp: , Rfl:    Lancets (ONETOUCH DELICA PLUS [id number], CHECK BLOOD SUGAR ONCE DAILY OR AS DIRECTED Dx E11.42 (one touch ultra 2), Disp: 100 each, Rfl: 3   meclizine (ANTIVERT) 25 MG tablet, TAKE (1) TABLET THREE TIMES DAILY AS NEEDED DIZZINESS, Disp: 30 tablet, Rfl: 0   metFORMIN (GLUCOPHAGE) 1000 MG tablet, Take 1 tablet (1,000 mg total) by mouth daily with breakfast. (NEEDS TO BE SEEN BEFORE NEXT REFILL), Disp: 15 tablet, Rfl: 0   metoprolol  tartrate (LOPRESSOR) 25 MG tablet, Take 1 tablet (25 mg total) by mouth 2 (two) times daily. (Patient taking differently: Take 25 mg by mouth daily.), Disp: 180 tablet, Rfl: 1   potassium chloride SA (KLOR-CON M) 20 MEQ tablet, Take 1 tablet (20 mEq total) by mouth daily. Come in for labs 01/31/22., Disp: 30 tablet, Rfl: 3   raloxifene (EVISTA) 60 MG tablet, TAKE 1 TABLET DAILY, Disp: 90 tablet, Rfl: 0   rosuvastatin (CRESTOR) 10 MG tablet, TAKE 1/2 TABLET DAILY, Disp: 45 tablet, Rfl:  0 Social History   Socioeconomic History   Marital status: Widowed    Spouse name: Not on file   Number of children: Not on file   Years of education: Not on file   Highest education level: Not on file  Occupational History   Occupation: retired  Tobacco Use   Smoking status: Never   Smokeless tobacco: Never  Vaping Use   Vaping Use: Never used  Substance and Sexual Activity   Alcohol use: No   Drug use: No   Sexual activity: Never  Other Topics Concern   Not on file  Social History Narrative   Lives alone   Close friends and family nearby   Social Determinants of Health   Financial Resource Strain: [place]  (01/06/2021)   Overall Financial Resource Strain (CARDIA)    Difficulty of Paying Living Expenses: Not hard at all  Food Insecurity: No Food Insecurity (01/06/2021)   Hunger Vital Sign    Worried About Running Out of Food in the Last Year: Never true    [place] in the Last Year: Never true  Transportation Needs: No Transportation Needs (01/06/2021)   PRAPARE - Hydrologist (Medical): No    Lack of Transportation (Non-Medical): No  Physical Activity: Insufficiently Active (01/06/2021)   Exercise Vital Sign    Days of Exercise per Week: 7 days    Minutes of Exercise per Session: 10 min  Stress: No Stress Concern Present (01/06/2021)   [place]    Feeling of Stress : Not at all  Social Connections: Moderately Integrated (01/06/2021)   Social Connection and Isolation Panel [NHANES]    Frequency of Communication with Friends and Family: More than three times a week    Frequency of Social Gatherings with Friends and Family: More than three times a week    Attends Religious Services: More than 4 times per year    Active Member of [employer] or Organizations: Yes    Attends Archivist Meetings: 1 to 4 times per year    Marital Status: Widowed  Intimate  Partner Violence: Not At Risk (01/06/2021)   Humiliation, Afraid, Rape, and Kick questionnaire    Fear of Current or Ex-Partner: No    Emotionally Abused: No    Physically Abused: No    Sexually Abused: No   Family History  Problem Relation Age of Onset   Heart disease Mother    Hypertension Mother    Cancer Father        lymp nodes   Hypertension Brother     Objective: Office vital signs reviewed. BP (!) 158/88   Ht '5\' 2"'$  (1.575 m)   Wt 141 lb (64 kg)   BMI 25.79 kg/m   Physical Examination:  General: Awake, alert, well nourished, well-appearing elderly female.  No acute distress HEENT: sclera white, MMM Cardio: Bradycardic rate and regular rhythm, S1S2  heard, no murmurs appreciated Pulm: clear to auscultation bilaterally, no wheezes, rhonchi or rales; normal work of breathing on room air MSK: Ambulating independently  Assessment/ Plan: 89 y.o. female   Type 2 diabetes mellitus with stage 3a chronic kidney disease, without long-term current use of insulin ([place]) - Plan: metFORMIN (GLUCOPHAGE) 500 MG tablet  Hyperlipidemia associated with type 2 diabetes mellitus ([place])  Hypertension associated with diabetes ([place])  Diabetic polyneuropathy associated with type 2 diabetes mellitus ([place])  History of diabetic ulcer of foot  Sugar at goal for this 89 year old female.  Given history of recurrent falls I would hesitate to be more aggressive about her sugar regimen.  For now continue 500 mg daily.  Rx has been sent  Reviewed lab results.  Continue current regimen  Blood pressure NOT controlled.  No changes for now but I would like her to come back in for blood pressure recheck with me.  She had metoprolol recently advanced I hesitate to advance medications any further given recurrent falls  Foot exam will be due at next visit.  Denying any foot ulcers at this time  No orders of the defined types were placed in this encounter.  No orders of the defined types were placed in  this encounter.    Janora Norlander, DO [place] [phone]

## 2022-05-28 ENCOUNTER — Other Ambulatory Visit: Payer: Self-pay | Admitting: Family Medicine

## 2022-05-28 DIAGNOSIS — E876 Hypokalemia: Secondary | ICD-10-CM

## 2022-05-28 DIAGNOSIS — E1142 Type 2 diabetes mellitus with diabetic polyneuropathy: Secondary | ICD-10-CM

## 2022-05-28 DIAGNOSIS — N1831 Chronic kidney disease, stage 3a: Secondary | ICD-10-CM

## 2022-05-31 DIAGNOSIS — E119 Type 2 diabetes mellitus without complications: Secondary | ICD-10-CM | POA: Diagnosis not present

## 2022-05-31 DIAGNOSIS — Z961 Presence of intraocular lens: Secondary | ICD-10-CM | POA: Diagnosis not present

## 2022-05-31 DIAGNOSIS — H40013 Open angle with borderline findings, low risk, bilateral: Secondary | ICD-10-CM | POA: Diagnosis not present

## 2022-05-31 LAB — HM DIABETES EYE EXAM

## 2022-06-18 ENCOUNTER — Other Ambulatory Visit: Payer: Self-pay | Admitting: Family Medicine

## 2022-06-23 ENCOUNTER — Other Ambulatory Visit: Payer: Self-pay | Admitting: Family Medicine

## 2022-06-23 DIAGNOSIS — N1831 Chronic kidney disease, stage 3a: Secondary | ICD-10-CM

## 2022-06-23 DIAGNOSIS — E1169 Type 2 diabetes mellitus with other specified complication: Secondary | ICD-10-CM

## 2022-06-23 DIAGNOSIS — M8580 Other specified disorders of bone density and structure, unspecified site: Secondary | ICD-10-CM

## 2022-06-23 DIAGNOSIS — E1142 Type 2 diabetes mellitus with diabetic polyneuropathy: Secondary | ICD-10-CM

## 2022-06-25 DIAGNOSIS — I130 Hypertensive heart and chronic kidney disease with heart failure and stage 1 through stage 4 chronic kidney disease, or unspecified chronic kidney disease: Secondary | ICD-10-CM | POA: Diagnosis not present

## 2022-06-25 DIAGNOSIS — E1122 Type 2 diabetes mellitus with diabetic chronic kidney disease: Secondary | ICD-10-CM | POA: Diagnosis not present

## 2022-06-25 DIAGNOSIS — E1142 Type 2 diabetes mellitus with diabetic polyneuropathy: Secondary | ICD-10-CM | POA: Diagnosis not present

## 2022-06-25 DIAGNOSIS — D6869 Other thrombophilia: Secondary | ICD-10-CM | POA: Diagnosis not present

## 2022-06-25 DIAGNOSIS — E1169 Type 2 diabetes mellitus with other specified complication: Secondary | ICD-10-CM | POA: Diagnosis not present

## 2022-06-25 DIAGNOSIS — I509 Heart failure, unspecified: Secondary | ICD-10-CM | POA: Diagnosis not present

## 2022-06-25 DIAGNOSIS — E261 Secondary hyperaldosteronism: Secondary | ICD-10-CM | POA: Diagnosis not present

## 2022-06-25 DIAGNOSIS — E1165 Type 2 diabetes mellitus with hyperglycemia: Secondary | ICD-10-CM | POA: Diagnosis not present

## 2022-06-25 DIAGNOSIS — I4891 Unspecified atrial fibrillation: Secondary | ICD-10-CM | POA: Diagnosis not present

## 2022-06-25 DIAGNOSIS — E1139 Type 2 diabetes mellitus with other diabetic ophthalmic complication: Secondary | ICD-10-CM | POA: Diagnosis not present

## 2022-06-25 DIAGNOSIS — E559 Vitamin D deficiency, unspecified: Secondary | ICD-10-CM | POA: Diagnosis not present

## 2022-06-25 DIAGNOSIS — N183 Chronic kidney disease, stage 3 unspecified: Secondary | ICD-10-CM | POA: Diagnosis not present

## 2022-07-04 ENCOUNTER — Other Ambulatory Visit: Payer: Self-pay | Admitting: Family Medicine

## 2022-07-04 DIAGNOSIS — E876 Hypokalemia: Secondary | ICD-10-CM

## 2022-07-26 ENCOUNTER — Other Ambulatory Visit: Payer: Self-pay | Admitting: Family Medicine

## 2022-07-26 DIAGNOSIS — E1142 Type 2 diabetes mellitus with diabetic polyneuropathy: Secondary | ICD-10-CM

## 2022-07-26 DIAGNOSIS — N1831 Chronic kidney disease, stage 3a: Secondary | ICD-10-CM

## 2022-07-31 ENCOUNTER — Telehealth: Payer: Self-pay | Admitting: Family Medicine

## 2022-07-31 ENCOUNTER — Other Ambulatory Visit: Payer: Self-pay

## 2022-07-31 NOTE — Telephone Encounter (Signed)
Only A1c is needed.  She can do at the visit unless she just wants to come in early.  Had all other labs done in Feb

## 2022-07-31 NOTE — Telephone Encounter (Signed)
Pt scheduled to come in tomorrow for labs for her visit on 08/03/22. Please add future lab order.

## 2022-08-01 ENCOUNTER — Other Ambulatory Visit: Payer: PPO

## 2022-08-01 DIAGNOSIS — N1831 Chronic kidney disease, stage 3a: Secondary | ICD-10-CM | POA: Diagnosis not present

## 2022-08-01 DIAGNOSIS — E1122 Type 2 diabetes mellitus with diabetic chronic kidney disease: Secondary | ICD-10-CM | POA: Diagnosis not present

## 2022-08-01 DIAGNOSIS — N183 Chronic kidney disease, stage 3 unspecified: Secondary | ICD-10-CM

## 2022-08-01 LAB — BAYER DCA HB A1C WAIVED: HB A1C (BAYER DCA - WAIVED): 7.2 % — ABNORMAL HIGH (ref 4.8–5.6)

## 2022-08-01 NOTE — Progress Notes (Deleted)
Subjective: CC:DM PCP: Raliegh Ip, DO XBJ:YNWGN Sara Holmes is a 89 y.o. female presenting to clinic today for:  1. Type 2 Diabetes with hypertension, hyperlipidemia w/ CKD3a:  Glucometer:***.   High at home: ***; Low at home: ***, Taking medication(s): ***,.  Last eye exam: UTD Last foot exam: needs Last A1c:  Lab Results  Component Value Date   HGBA1C 7.2 (H) 08/01/2022   Nephropathy screen indicated?: UTD Last flu, zoster and/or pneumovax: PNA Immunization History  Administered Date(s) Administered   Covid-19, Mrna,Vaccine(Spikevax)78yrs and older 03/08/2022   Fluad Quad(high Dose 65+) 12/14/2019, 12/26/2020, 01/23/2022   Influenza Whole 12/28/2008   Influenza, High Dose Seasonal PF 12/11/2016, 12/20/2017   Influenza,inj,Quad PF,6+ Mos 12/03/2012, 01/01/2014, 12/31/2014, 11/28/2015   Influenza,inj,quad, With Preservative 12/03/2018   Influenza-Unspecified 12/04/2018   Moderna Covid-19 Vaccine Bivalent Booster 37yrs & up 12/13/2020   Moderna Sars-Covid-2 Vaccination 05/11/2019, 06/08/2019, 01/26/2020, 06/21/2020   Pneumococcal Polysaccharide-23 03/31/2010   Zoster Recombinat (Shingrix) 11/07/2016, 04/01/2017   Zoster, Live 01/28/2007    ROS: ***dizziness, LOC, polyuria, polydipsia, unintended weight loss/gain, foot ulcerations, numbness or tingling in extremities, shortness of breath or chest pain.    ROS: Per HPI  Allergies  Allergen Reactions   Prednisone Other (See Comments)    Elevated blood sugar   Amoxicillin Itching and Rash   Tetanus Toxoids Rash   Past Medical History:  Diagnosis Date   Diabetes mellitus without complication (HCC)    Diverticulosis    Glaucoma    Gout    Hyperlipidemia    Hypertension    Osteopenia    Plantar fasciitis    Sinus bradycardia    Vertigo     Current Outpatient Medications:    allopurinol (ZYLOPRIM) 100 MG tablet, TAKE 1 TABLET EVERY DAY, Disp: 90 tablet, Rfl: 3   blood glucose meter kit and supplies, 1  each by Other route as directed. Dispense ONE TOUCH ULTRA 2. Use once per day fasting. (FOR ICD-10 E10.9, E11.9)., Disp: 1 each, Rfl: 0   Calcium Carbonate-Vitamin D (CALTRATE 600+D PO), Take 1 tablet by mouth 2 (two) times daily., Disp: , Rfl:    Cholecalciferol (VITAMIN D3) 1000 UNITS CAPS, Take 1 tablet by mouth daily., Disp: , Rfl:    diclofenac Sodium (VOLTAREN) 1 % GEL, Apply 2 g topically 4 (four) times daily., Disp: 50 g, Rfl: 1   fosinopril (MONOPRIL) 40 MG tablet, TAKE ONE TABLET ONCE DAILY, Disp: 30 tablet, Rfl: 0   furosemide (LASIX) 20 MG tablet, TAKE ONE TABLET DAILY, Disp: 30 tablet, Rfl: 1   gabapentin (NEURONTIN) 300 MG capsule, TAKE ONE CAPSULE AT BEDTIME, Disp: 30 capsule, Rfl: 0   glucose blood (ONETOUCH ULTRA) test strip, CHECK BLOOD SUGAR ONCE A DAY Dx E11.9, Disp: 100 strip, Rfl: 3   halobetasol (ULTRAVATE) 0.05 % cream, Apply topically 2 (two) times daily., Disp: , Rfl:    Lancets (ONETOUCH DELICA PLUS LANCET33G) MISC, CHECK BLOOD SUGAR ONCE DAILY OR AS DIRECTED Dx E11.42 (one touch ultra 2), Disp: 100 each, Rfl: 3   meclizine (ANTIVERT) 25 MG tablet, TAKE (1) TABLET THREE TIMES DAILY AS NEEDED DIZZINESS, Disp: 30 tablet, Rfl: 0   metFORMIN (GLUCOPHAGE) 500 MG tablet, Take 1 tablet (500 mg total) by mouth daily with breakfast., Disp: 90 tablet, Rfl: 3   metoprolol tartrate (LOPRESSOR) 25 MG tablet, Take 1 tablet (25 mg total) by mouth 2 (two) times daily. (Patient taking differently: Take 25 mg by mouth daily.), Disp: 180 tablet, Rfl: 1  potassium chloride SA (KLOR-CON M) 20 MEQ tablet, TAKE ONE TABLET DAILY, Disp: 30 tablet, Rfl: 0   raloxifene (EVISTA) 60 MG tablet, TAKE 1 TABLET DAILY, Disp: 90 tablet, Rfl: 0   rosuvastatin (CRESTOR) 10 MG tablet, TAKE 1/2 TABLET DAILY, Disp: 45 tablet, Rfl: 0 Social History   Socioeconomic History   Marital status: Widowed    Spouse name: Not on file   Number of children: Not on file   Years of education: Not on file   Highest  education level: Not on file  Occupational History   Occupation: retired  Tobacco Use   Smoking status: Never   Smokeless tobacco: Never  Vaping Use   Vaping Use: Never used  Substance and Sexual Activity   Alcohol use: No   Drug use: No   Sexual activity: Never  Other Topics Concern   Not on file  Social History Narrative   Lives alone   Close friends and family nearby   Social Determinants of Health   Financial Resource Strain: Low Risk  (01/06/2021)   Overall Financial Resource Strain (CARDIA)    Difficulty of Paying Living Expenses: Not hard at all  Food Insecurity: No Food Insecurity (01/06/2021)   Hunger Vital Sign    Worried About Running Out of Food in the Last Year: Never true    Ran Out of Food in the Last Year: Never true  Transportation Needs: No Transportation Needs (01/06/2021)   PRAPARE - Administrator, Civil Service (Medical): No    Lack of Transportation (Non-Medical): No  Physical Activity: Insufficiently Active (01/06/2021)   Exercise Vital Sign    Days of Exercise per Week: 7 days    Minutes of Exercise per Session: 10 min  Stress: No Stress Concern Present (01/06/2021)   [employer] of Occupational Health - Occupational Stress Questionnaire    Feeling of Stress : Not at all  Social Connections: Moderately Integrated (01/06/2021)   Social Connection and Isolation Panel [NHANES]    Frequency of Communication with Friends and Family: More than three times a week    Frequency of Social Gatherings with Friends and Family: More than three times a week    Attends Religious Services: More than 4 times per year    Active Member of [employer] or Organizations: Yes    Attends Banker Meetings: 1 to 4 times per year    Marital Status: Widowed  Intimate Partner Violence: Not At Risk (01/06/2021)   Humiliation, Afraid, Rape, and Kick questionnaire    Fear of Current or Ex-Partner: No    Emotionally Abused: No    Physically Abused: No     Sexually Abused: No   Family History  Problem Relation Age of Onset   Heart disease Mother    Hypertension Mother    Cancer Father        lymp nodes   Hypertension Brother     Objective: Office vital signs reviewed. There were no vitals taken for this visit.  Physical Examination:  General: Awake, alert, *** nourished, No acute distress HEENT: Normal    Neck: No masses palpated. No lymphadenopathy    Ears: Tympanic membranes intact, normal light reflex, no erythema, no bulging    Eyes: PERRLA, extraocular membranes intact, sclera ***    Nose: nasal turbinates moist, *** nasal discharge    Throat: moist mucus membranes, no erythema, *** tonsillar exudate.  Airway is patent Cardio: regular rate and rhythm, S1S2 heard, no murmurs appreciated Pulm: clear  to auscultation bilaterally, no wheezes, rhonchi or rales; normal work of breathing on room air GI: soft, non-tender, non-distended, bowel sounds present x4, no hepatomegaly, no splenomegaly, no masses GU: external vaginal tissue ***, cervix ***, *** punctate lesions on cervix appreciated, *** discharge from cervical os, *** bleeding, *** cervical motion tenderness, *** abdominal/ adnexal masses Extremities: warm, well perfused, No edema, cyanosis or clubbing; +*** pulses bilaterally MSK: *** gait and *** station Skin: dry; intact; no rashes or lesions Neuro: *** Strength and light touch sensation grossly intact, *** DTRs ***/4  Assessment/ Plan: 89 y.o. female   ***  No orders of the defined types were placed in this encounter.  No orders of the defined types were placed in this encounter.    Raliegh Ip, DO [facility] [phone]

## 2022-08-02 ENCOUNTER — Other Ambulatory Visit: Payer: Self-pay | Admitting: Family Medicine

## 2022-08-02 DIAGNOSIS — E876 Hypokalemia: Secondary | ICD-10-CM

## 2022-08-03 ENCOUNTER — Ambulatory Visit: Payer: PPO | Admitting: Family Medicine

## 2022-08-03 DIAGNOSIS — N1831 Chronic kidney disease, stage 3a: Secondary | ICD-10-CM

## 2022-08-18 ENCOUNTER — Other Ambulatory Visit: Payer: Self-pay | Admitting: Family Medicine

## 2022-08-23 DIAGNOSIS — I872 Venous insufficiency (chronic) (peripheral): Secondary | ICD-10-CM | POA: Diagnosis not present

## 2022-08-25 ENCOUNTER — Other Ambulatory Visit: Payer: Self-pay | Admitting: Family Medicine

## 2022-08-25 DIAGNOSIS — E1142 Type 2 diabetes mellitus with diabetic polyneuropathy: Secondary | ICD-10-CM

## 2022-08-25 DIAGNOSIS — N1831 Chronic kidney disease, stage 3a: Secondary | ICD-10-CM

## 2022-08-29 ENCOUNTER — Telehealth: Payer: Self-pay | Admitting: Family Medicine

## 2022-08-29 DIAGNOSIS — E1169 Type 2 diabetes mellitus with other specified complication: Secondary | ICD-10-CM

## 2022-08-29 DIAGNOSIS — E1122 Type 2 diabetes mellitus with diabetic chronic kidney disease: Secondary | ICD-10-CM

## 2022-08-29 DIAGNOSIS — E1159 Type 2 diabetes mellitus with other circulatory complications: Secondary | ICD-10-CM

## 2022-08-29 NOTE — Telephone Encounter (Signed)
Future orders and appointment made, patient aware.

## 2022-08-30 ENCOUNTER — Ambulatory Visit: Payer: PPO | Admitting: Internal Medicine

## 2022-09-03 ENCOUNTER — Other Ambulatory Visit: Payer: PPO

## 2022-09-03 DIAGNOSIS — E1122 Type 2 diabetes mellitus with diabetic chronic kidney disease: Secondary | ICD-10-CM | POA: Diagnosis not present

## 2022-09-03 DIAGNOSIS — E785 Hyperlipidemia, unspecified: Secondary | ICD-10-CM | POA: Diagnosis not present

## 2022-09-03 DIAGNOSIS — E1169 Type 2 diabetes mellitus with other specified complication: Secondary | ICD-10-CM

## 2022-09-03 DIAGNOSIS — N1831 Chronic kidney disease, stage 3a: Secondary | ICD-10-CM | POA: Diagnosis not present

## 2022-09-03 DIAGNOSIS — I152 Hypertension secondary to endocrine disorders: Secondary | ICD-10-CM

## 2022-09-03 DIAGNOSIS — E1159 Type 2 diabetes mellitus with other circulatory complications: Secondary | ICD-10-CM | POA: Diagnosis not present

## 2022-09-03 LAB — BAYER DCA HB A1C WAIVED: HB A1C (BAYER DCA - WAIVED): 7.1 % — ABNORMAL HIGH (ref 4.8–5.6)

## 2022-09-04 LAB — CMP14+EGFR
ALT: 9 IU/L (ref 0–32)
AST: 22 IU/L (ref 0–40)
Albumin: 4.1 g/dL (ref 3.6–4.6)
Alkaline Phosphatase: 58 IU/L (ref 44–121)
BUN/Creatinine Ratio: 17 (ref 12–28)
BUN: 18 mg/dL (ref 10–36)
Bilirubin Total: 0.8 mg/dL (ref 0.0–1.2)
CO2: 27 mmol/L (ref 20–29)
Calcium: 9.5 mg/dL (ref 8.7–10.3)
Chloride: 103 mmol/L (ref 96–106)
Creatinine, Ser: 1.07 mg/dL — ABNORMAL HIGH (ref 0.57–1.00)
Globulin, Total: 1.8 g/dL (ref 1.5–4.5)
Glucose: 146 mg/dL — ABNORMAL HIGH (ref 70–99)
Potassium: 4 mmol/L (ref 3.5–5.2)
Sodium: 141 mmol/L (ref 134–144)
Total Protein: 5.9 g/dL — ABNORMAL LOW (ref 6.0–8.5)
eGFR: 49 mL/min/{1.73_m2} — ABNORMAL LOW (ref >59)

## 2022-09-04 LAB — LIPID PANEL
Chol/HDL Ratio: 2.3 ratio (ref 0.0–4.4)
Cholesterol, Total: 134 mg/dL (ref 100–199)
HDL: 58 mg/dL (ref >39)
LDL Chol Calc (NIH): 57 mg/dL (ref 0–99)
Triglycerides: 105 mg/dL (ref 0–149)
VLDL Cholesterol Cal: 19 mg/dL (ref 5–40)

## 2022-09-04 LAB — CBC WITH DIFFERENTIAL/PLATELET
Basophils Absolute: 0 10*3/uL (ref 0.0–0.2)
Basos: 0 %
EOS (ABSOLUTE): 0.1 10*3/uL (ref 0.0–0.4)
Eos: 1 %
Hematocrit: 35.9 % (ref 34.0–46.6)
Hemoglobin: 12 g/dL (ref 11.1–15.9)
Immature Grans (Abs): 0 10*3/uL (ref 0.0–0.1)
Immature Granulocytes: 0 %
Lymphocytes Absolute: 2.3 10*3/uL (ref 0.7–3.1)
Lymphs: 32 %
MCH: 32 pg (ref 26.6–33.0)
MCHC: 33.4 g/dL (ref 31.5–35.7)
MCV: 96 fL (ref 79–97)
Monocytes Absolute: 0.5 10*3/uL (ref 0.1–0.9)
Monocytes: 7 %
Neutrophils Absolute: 4.3 10*3/uL (ref 1.4–7.0)
Neutrophils: 60 %
Platelets: 193 10*3/uL (ref 150–450)
RBC: 3.75 x10E6/uL — ABNORMAL LOW (ref 3.77–5.28)
RDW: 12.7 % (ref 11.7–15.4)
WBC: 7.2 10*3/uL (ref 3.4–10.8)

## 2022-09-05 ENCOUNTER — Ambulatory Visit (INDEPENDENT_AMBULATORY_CARE_PROVIDER_SITE_OTHER): Payer: PPO | Admitting: Family Medicine

## 2022-09-05 ENCOUNTER — Encounter: Payer: Self-pay | Admitting: Family Medicine

## 2022-09-05 VITALS — BP 139/67 | Temp 98.0°F | Resp 20 | Ht 62.0 in | Wt 140.4 lb

## 2022-09-05 DIAGNOSIS — Z7984 Long term (current) use of oral hypoglycemic drugs: Secondary | ICD-10-CM | POA: Diagnosis not present

## 2022-09-05 DIAGNOSIS — E1122 Type 2 diabetes mellitus with diabetic chronic kidney disease: Secondary | ICD-10-CM | POA: Diagnosis not present

## 2022-09-05 DIAGNOSIS — E1169 Type 2 diabetes mellitus with other specified complication: Secondary | ICD-10-CM

## 2022-09-05 DIAGNOSIS — I152 Hypertension secondary to endocrine disorders: Secondary | ICD-10-CM

## 2022-09-05 DIAGNOSIS — N1831 Chronic kidney disease, stage 3a: Secondary | ICD-10-CM

## 2022-09-05 DIAGNOSIS — E1142 Type 2 diabetes mellitus with diabetic polyneuropathy: Secondary | ICD-10-CM | POA: Diagnosis not present

## 2022-09-05 DIAGNOSIS — Z8631 Personal history of diabetic foot ulcer: Secondary | ICD-10-CM

## 2022-09-05 DIAGNOSIS — E785 Hyperlipidemia, unspecified: Secondary | ICD-10-CM | POA: Diagnosis not present

## 2022-09-05 DIAGNOSIS — E1159 Type 2 diabetes mellitus with other circulatory complications: Secondary | ICD-10-CM | POA: Diagnosis not present

## 2022-09-05 NOTE — Patient Instructions (Signed)
STOP potassium Come in for lab check 09/14/2022 at 930am. We will recheck the potassium to see if you still need it since you are off the furosemide now

## 2022-09-05 NOTE — Progress Notes (Signed)
Subjective: CC:Dm PCP: Raliegh Ip, DO RUE:AVWUJ Sara Holmes is a 89 y.o. female presenting to clinic today for:  1. Type 2 Diabetes with hypertension, hyperlipidemia:  H/o DM foot ulcer.  No active ulcers.  She is followed by Dr. Ulice Brilliant regularly.  Has a corn that they are working on on her toe.  No skin breakdown.  She is compliant with her metformin, Crestor, Monopril and gabapentin.  Not taking Lasix anymore.  Not sure if she needs to keep taking the potassium and she is inquiring about this today.  She has had no recurrent falls since December after potassium was corrected  Last eye exam: UTD Last foot exam: needs Last A1c:  Lab Results  Component Value Date   HGBA1C 7.1 (H) 09/03/2022   Nephropathy screen indicated?: UTD Last flu, zoster and/or pneumovax:  Immunization History  Administered Date(s) Administered   Covid-19, Mrna,Vaccine(Spikevax)70yrs and older 03/08/2022   Fluad Quad(high Dose 65+) 12/14/2019, 12/26/2020, 01/23/2022   Influenza Whole 12/28/2008   Influenza, High Dose Seasonal PF 12/11/2016, 12/20/2017   Influenza,inj,Quad PF,6+ Mos 12/03/2012, 01/01/2014, 12/31/2014, 11/28/2015   Influenza,inj,quad, With Preservative 12/03/2018   Influenza-Unspecified 12/04/2018   Moderna Covid-19 Vaccine Bivalent Booster 28yrs & up 12/13/2020   Moderna Sars-Covid-2 Vaccination 05/11/2019, 06/08/2019, 01/26/2020, 06/21/2020   Pneumococcal Polysaccharide-23 03/31/2010   Zoster Recombinat (Shingrix) 11/07/2016, 04/01/2017   Zoster, Live 01/28/2007     ROS: Per HPI  Allergies  Allergen Reactions   Prednisone Other (See Comments)    Elevated blood sugar   Amoxicillin Itching and Rash   Tetanus Toxoids Rash   Past Medical History:  Diagnosis Date   Diabetes mellitus without complication (HCC)    Diverticulosis    Glaucoma    Gout    Hyperlipidemia    Hypertension    Osteopenia    Plantar fasciitis    Sinus bradycardia    Vertigo     Current Outpatient  Medications:    allopurinol (ZYLOPRIM) 100 MG tablet, TAKE 1 TABLET EVERY DAY, Disp: 90 tablet, Rfl: 3   blood glucose meter kit and supplies, 1 each by Other route as directed. Dispense ONE TOUCH ULTRA 2. Use once per day fasting. (FOR ICD-10 E10.9, E11.9)., Disp: 1 each, Rfl: 0   Calcium Carbonate-Vitamin D (CALTRATE 600+D PO), Take 1 tablet by mouth 2 (two) times daily., Disp: , Rfl:    Cholecalciferol (VITAMIN D3) 1000 UNITS CAPS, Take 1 tablet by mouth daily., Disp: , Rfl:    clobetasol ointment (TEMOVATE) 0.05 %, Apply topically., Disp: , Rfl:    diclofenac Sodium (VOLTAREN) 1 % GEL, Apply 2 g topically 4 (four) times daily., Disp: 50 g, Rfl: 1   fosinopril (MONOPRIL) 40 MG tablet, TAKE ONE TABLET ONCE DAILY, Disp: 30 tablet, Rfl: 0   gabapentin (NEURONTIN) 300 MG capsule, TAKE ONE CAPSULE AT BEDTIME, Disp: 30 capsule, Rfl: 0   glucose blood (ONETOUCH ULTRA) test strip, CHECK BLOOD SUGAR ONCE A DAY Dx E11.9, Disp: 100 strip, Rfl: 3   Lancets (ONETOUCH DELICA PLUS LANCET33G) MISC, CHECK BLOOD SUGAR ONCE DAILY OR AS DIRECTED Dx E11.42 (one touch ultra 2), Disp: 100 each, Rfl: 3   meclizine (ANTIVERT) 25 MG tablet, TAKE (1) TABLET THREE TIMES DAILY AS NEEDED DIZZINESS, Disp: 30 tablet, Rfl: 0   metFORMIN (GLUCOPHAGE) 500 MG tablet, Take 1 tablet (500 mg total) by mouth daily with breakfast., Disp: 90 tablet, Rfl: 3   metoprolol tartrate (LOPRESSOR) 25 MG tablet, Take 1 tablet (25 mg total) by mouth  2 (two) times daily. (Patient taking differently: Take 25 mg by mouth daily.), Disp: 180 tablet, Rfl: 1   potassium chloride SA (KLOR-CON M) 20 MEQ tablet, TAKE ONE TABLET DAILY, Disp: 30 tablet, Rfl: 0   raloxifene (EVISTA) 60 MG tablet, TAKE 1 TABLET DAILY, Disp: 90 tablet, Rfl: 0   rosuvastatin (CRESTOR) 10 MG tablet, TAKE 1/2 TABLET DAILY, Disp: 45 tablet, Rfl: 0   furosemide (LASIX) 20 MG tablet, TAKE ONE TABLET DAILY (Patient not taking: Reported on 09/05/2022), Disp: 30 tablet, Rfl: 1 Social  History   Socioeconomic History   Marital status: Widowed    Spouse name: Not on file   Number of children: Not on file   Years of education: Not on file   Highest education level: Not on file  Occupational History   Occupation: retired  Tobacco Use   Smoking status: Never   Smokeless tobacco: Never  Vaping Use   Vaping Use: Never used  Substance and Sexual Activity   Alcohol use: No   Drug use: No   Sexual activity: Never  Other Topics Concern   Not on file  Social History Narrative   Lives alone   Close friends and family nearby   Social Determinants of Health   Financial Resource Strain: Low Risk  (01/06/2021)   Overall Financial Resource Strain (CARDIA)    Difficulty of Paying Living Expenses: Not hard at all  Food Insecurity: No Food Insecurity (01/06/2021)   Hunger Vital Sign    Worried About Running Out of Food in the Last Year: Never true    Ran Out of Food in the Last Year: Never true  Transportation Needs: No Transportation Needs (01/06/2021)   PRAPARE - Administrator, Civil Service (Medical): No    Lack of Transportation (Non-Medical): No  Physical Activity: Insufficiently Active (01/06/2021)   Exercise Vital Sign    Days of Exercise per Week: 7 days    Minutes of Exercise per Session: 10 min  Stress: No Stress Concern Present (01/06/2021)   [employer] of Occupational Health - Occupational Stress Questionnaire    Feeling of Stress : Not at all  Social Connections: Moderately Integrated (01/06/2021)   Social Connection and Isolation Panel [NHANES]    Frequency of Communication with Friends and Family: More than three times a week    Frequency of Social Gatherings with Friends and Family: More than three times a week    Attends Religious Services: More than 4 times per year    Active Member of [employer] or Organizations: Yes    Attends Banker Meetings: 1 to 4 times per year    Marital Status: Widowed  Intimate Partner  Violence: Not At Risk (01/06/2021)   Humiliation, Afraid, Rape, and Kick questionnaire    Fear of Current or Ex-Partner: No    Emotionally Abused: No    Physically Abused: No    Sexually Abused: No   Family History  Problem Relation Age of Onset   Heart disease Mother    Hypertension Mother    Cancer Father        lymp nodes   Hypertension Brother     Objective: Office vital signs reviewed. BP 139/67   Temp 98 F (36.7 C) (Oral)   Resp 20   Ht 5\' 2"  (1.575 m)   Wt 140 lb 6 oz (63.7 kg)   SpO2 93%   BMI 25.67 kg/m   Physical Examination:  General: Awake, alert, well nourished, No acute  distress HEENT: sclera white, MMM Cardio: regular rate and rhythm, S1S2 heard, no murmurs appreciated Pulm: clear to auscultation bilaterally, no wheezes, rhonchi or rales; normal work of breathing on room air Extremities: Warm.  Some edema present, left greater than right.  Has varicose veins throughout bilateral lower extremities and stasis dermatitis changes Neuro: see DM foot  Diabetic Foot Exam - Simple   Simple Foot Form Diabetic Foot exam was performed with the following findings: Yes 09/05/2022  2:35 PM  Visual Inspection See comments: Yes Sensation Testing Intact to touch and monofilament testing bilaterally: Yes Pulse Check See comments: Yes Comments +1 pedal pulses. Multiple varicose veins. Corn noted on left pinky toe     Assessment/ Plan: 89 y.o. female   Type 2 diabetes mellitus with stage 3a chronic kidney disease, without long-term current use of insulin (HCC) - Plan: Basic metabolic panel  Hyperlipidemia associated with type 2 diabetes mellitus (HCC)  Hypertension associated with diabetes (HCC)  Diabetic polyneuropathy associated with type 2 diabetes mellitus (HCC)  History of diabetic ulcer of foot  Sugar controlled for age with A1c of 7.1.  okay to hold potassium since now off Lasix.  Her potassium was normal on recent lab check.  I would like her to be  rechecked again in 1 week.  If her potassium drops substantially we are going to have to resume daily potassium use, if not may stay off of potassium with close follow-up.  She will continue statin and current blood pressure medications as well as gabapentin for polyneuropathy.  Diabetic exam performed today and there was no evidence of diabetic ulcers on exam today.  No orders of the defined types were placed in this encounter.  No orders of the defined types were placed in this encounter.    Raliegh Ip, DO [facility] [phone]

## 2022-09-14 ENCOUNTER — Other Ambulatory Visit: Payer: PPO

## 2022-09-14 DIAGNOSIS — N1831 Chronic kidney disease, stage 3a: Secondary | ICD-10-CM

## 2022-09-14 DIAGNOSIS — E1122 Type 2 diabetes mellitus with diabetic chronic kidney disease: Secondary | ICD-10-CM | POA: Diagnosis not present

## 2022-09-14 LAB — BASIC METABOLIC PANEL
BUN/Creatinine Ratio: 17 (ref 12–28)
BUN: 18 mg/dL (ref 10–36)
CO2: 27 mmol/L (ref 20–29)
Calcium: 9.2 mg/dL (ref 8.7–10.3)
Chloride: 101 mmol/L (ref 96–106)
Creatinine, Ser: 1.05 mg/dL — ABNORMAL HIGH (ref 0.57–1.00)
Glucose: 155 mg/dL — ABNORMAL HIGH (ref 70–99)
Potassium: 3.9 mmol/L (ref 3.5–5.2)
Sodium: 143 mmol/L (ref 134–144)
eGFR: 50 mL/min/{1.73_m2} — ABNORMAL LOW (ref >59)

## 2022-09-17 ENCOUNTER — Other Ambulatory Visit: Payer: Self-pay | Admitting: Family Medicine

## 2022-09-17 DIAGNOSIS — E1169 Type 2 diabetes mellitus with other specified complication: Secondary | ICD-10-CM

## 2022-09-18 ENCOUNTER — Other Ambulatory Visit: Payer: Self-pay | Admitting: Family Medicine

## 2022-09-19 ENCOUNTER — Other Ambulatory Visit: Payer: Self-pay

## 2022-09-19 MED ORDER — METOPROLOL TARTRATE 25 MG PO TABS: 25.0000 mg | ORAL_TABLET | Freq: Two times a day (BID) | ORAL | 1 refills | Status: DC

## 2022-09-20 ENCOUNTER — Other Ambulatory Visit: Payer: Self-pay | Admitting: Family Medicine

## 2022-09-20 DIAGNOSIS — M8580 Other specified disorders of bone density and structure, unspecified site: Secondary | ICD-10-CM

## 2022-09-22 ENCOUNTER — Other Ambulatory Visit: Payer: Self-pay | Admitting: Family Medicine

## 2022-09-22 DIAGNOSIS — N1831 Chronic kidney disease, stage 3a: Secondary | ICD-10-CM

## 2022-09-22 DIAGNOSIS — E1142 Type 2 diabetes mellitus with diabetic polyneuropathy: Secondary | ICD-10-CM

## 2022-10-11 DIAGNOSIS — L84 Corns and callosities: Secondary | ICD-10-CM | POA: Diagnosis not present

## 2022-10-11 DIAGNOSIS — M79676 Pain in unspecified toe(s): Secondary | ICD-10-CM | POA: Diagnosis not present

## 2022-10-11 DIAGNOSIS — B351 Tinea unguium: Secondary | ICD-10-CM | POA: Diagnosis not present

## 2022-10-11 DIAGNOSIS — E1142 Type 2 diabetes mellitus with diabetic polyneuropathy: Secondary | ICD-10-CM | POA: Diagnosis not present

## 2022-10-22 ENCOUNTER — Other Ambulatory Visit: Payer: Self-pay | Admitting: Family Medicine

## 2022-10-24 ENCOUNTER — Ambulatory Visit: Payer: PPO | Admitting: Cardiology

## 2022-11-07 ENCOUNTER — Encounter: Payer: Self-pay | Admitting: Internal Medicine

## 2022-11-07 ENCOUNTER — Ambulatory Visit: Payer: PPO | Attending: Cardiology | Admitting: Internal Medicine

## 2022-11-07 ENCOUNTER — Ambulatory Visit: Payer: PPO | Admitting: Internal Medicine

## 2022-11-07 VITALS — BP 118/68 | HR 63 | Ht 62.0 in | Wt 143.8 lb

## 2022-11-07 DIAGNOSIS — I491 Atrial premature depolarization: Secondary | ICD-10-CM | POA: Diagnosis not present

## 2022-11-07 MED ORDER — METOPROLOL TARTRATE 25 MG PO TABS: 25.0000 mg | ORAL_TABLET | Freq: Two times a day (BID) | ORAL | 1 refills | Status: DC

## 2022-11-07 NOTE — Patient Instructions (Signed)

## 2022-11-07 NOTE — Progress Notes (Signed)
Cardiology Office Note  Date: 11/07/2022   ID: Sara Holmes, DOB [birth year], MRN [id number]  PCP:  Raliegh Ip, DO  Cardiologist:  Marjo Bicker, MD Electrophysiologist:  None    History of Present Illness: Sara Holmes is a 89 y.o. 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 Accompanied by nephew.  Patient was sent from primary care clinic to the [facility] on 01/25/2022 for evaluation of atrial fibrillation. Patient had PCP checkup for irregular heartbeats and dizziness, EKG was performed and showed atrial fibrillation (per EKG report but the baseline artifact made the EKG uninterpretable) and was sent to the ER. In the ER, multiple EKGs were performed which showed normal sinus rhythm and PACs. Her labs were remarkable for hypokalemia and hypomagnesemia which were repleted. Cardiology was consulted in the ER who recommended outpatient echo and Holter monitor and no indication for anticoagulation as it was not A-fib. Upon discharge from the ER, patient denied having any irregular heartbeat, palpitations and dizziness. Nephew said that she was not eating enough food prior to the ER visit but he is making sure she gets adequate nutrition.  Patient was having adequate nutrition and water intake after ER visit. Upon arrival to the cardiology clinic today on 02/20/2022), EKG showed SVT (AVNRT) with spontaneously converted to normal sinus rhythm. Patient stated that she was nervous for her cardiology appointment. When she was in SVT/AVNRT, she experienced palpitations.  She subsequently underwent 1 week event monitor on 03/13/2022 that showed normal sinus rhythm with evidence of 638 runs of SVT with the fastest interval lasting 3 mins 21 secs and longest lasting 18 mins 35 secs. These SVT runs are most likely AVNRT but there is a brief irregular SVT which is likely atrial fibrillation. Significant  PAC burden of 13.9%. Symptoms correlated with PAC and SVT episodes. Echocardiogram from 03/2022 showed normal LVEF and mild mitral valve regurgitation.  She is here for follow-up visit, accompanied by nephew.     Overall doing great, no symptoms.  No palpitations, dizziness, presyncope, syncope, angina, DOE.  Takes metoprolol to 25 mg twice daily.  Wears compression socks.   Past Medical History:  Diagnosis Date   Diabetes mellitus without complication (HCC)    Diverticulosis    Glaucoma    Gout    Hyperlipidemia    Hypertension    Osteopenia    Plantar fasciitis    Sinus bradycardia    Vertigo     Past Surgical History:  Procedure Laterality Date   ABDOMINAL HYSTERECTOMY     CHOLECYSTECTOMY     EYE SURGERY     cataracts   NASAL SEPTUM SURGERY      Current Outpatient Medications  Medication Sig Dispense Refill   allopurinol (ZYLOPRIM) 100 MG tablet TAKE 1 TABLET EVERY DAY 90 tablet 3   blood glucose meter kit and supplies 1 each by Other route as directed. Dispense ONE TOUCH ULTRA 2. Use once per day fasting. (FOR ICD-10 E10.9, E11.9). 1 each 0   Calcium Carbonate-Vitamin D (CALTRATE 600+D PO) Take 1 tablet by mouth 2 (two) times daily.     Cholecalciferol (VITAMIN D3) 1000 UNITS CAPS Take 1 tablet by mouth daily.     clobetasol ointment (TEMOVATE) 0.05 % Apply topically.     diclofenac Sodium (VOLTAREN) 1 % GEL Apply 2 g topically 4 (four) times daily. 50 g 1   fosinopril (MONOPRIL) 40  MG tablet TAKE ONE TABLET ONCE DAILY 30 tablet 3   furosemide (LASIX) 20 MG tablet TAKE ONE TABLET DAILY 30 tablet 1   gabapentin (NEURONTIN) 300 MG capsule TAKE ONE CAPSULE AT BEDTIME 30 capsule 3   glucose blood (ONETOUCH ULTRA) test strip CHECK BLOOD SUGAR ONCE A DAY Dx E11.9 100 strip 3   Lancets (ONETOUCH DELICA PLUS LANCET33G) MISC CHECK BLOOD SUGAR ONCE DAILY OR AS DIRECTED Dx E11.42 (one touch ultra 2) 100 each 3   meclizine (ANTIVERT) 25 MG tablet TAKE (1) TABLET THREE TIMES DAILY AS  NEEDED DIZZINESS 30 tablet 0   metFORMIN (GLUCOPHAGE) 500 MG tablet Take 1 tablet (500 mg total) by mouth daily with breakfast. 90 tablet 3   metoprolol tartrate (LOPRESSOR) 25 MG tablet Take 1 tablet (25 mg total) by mouth 2 (two) times daily. 180 tablet 1   potassium chloride SA (KLOR-CON M) 20 MEQ tablet TAKE ONE TABLET DAILY 30 tablet 0   raloxifene (EVISTA) 60 MG tablet TAKE ONE TABLET DAILY 90 tablet 0   rosuvastatin (CRESTOR) 10 MG tablet TAKE 1/2 TABLET DAILY 45 tablet 1   No current facility-administered medications for this visit.   Allergies:  Prednisone, Amoxicillin, and Tetanus toxoids   Social History: The patient  reports that she has never smoked. She has never used smokeless tobacco. She reports that she does not drink alcohol and does not use drugs.   Family History: The patient's family history includes Cancer in her father; Heart disease in her mother; Hypertension in her brother and mother.   ROS:  Please see the history of present illness. Otherwise, complete review of systems is positive for none.  All other systems are reviewed and negative.   Physical Exam: VS:  BP 118/68   Pulse 63   Ht 5\' 2"  (1.575 m)   Wt 143 lb 12.8 oz (65.2 kg)   SpO2 94%   BMI 26.30 kg/m , BMI Body mass index is 26.3 kg/m.  Wt Readings from Last 3 Encounters:  11/07/22 143 lb 12.8 oz (65.2 kg)  09/05/22 140 lb 6 oz (63.7 kg)  05/04/22 141 lb (64 kg)    General: Patient appears comfortable at rest. HEENT: Conjunctiva and lids normal, oropharynx clear with moist mucosa. Neck: Supple, no elevated JVP or carotid bruits, no thyromegaly. Lungs: Clear to auscultation, nonlabored breathing at rest. Cardiac: Regular rate and rhythm, no S3 or significant systolic murmur, no pericardial rub. Abdomen: Soft, nontender, no hepatomegaly, bowel sounds present, no guarding or rebound. Extremities: 2+ pitting edema in bilateral lower extremities Skin: Warm and dry. Musculoskeletal: No  kyphosis. Neuropsychiatric: Alert and oriented x3, affect grossly appropriate.  ECG: EKG on 02/20/2022 showed SVT (AVNRT) which spontaneously converted to normal sinus rhythm.  Subsequent EKG showed normal sinus rhythm.  Recent Labwork: 01/25/2022: B Natriuretic Peptide 661.9; Magnesium 1.3 09/03/2022: ALT 9; AST 22; Hemoglobin 12.0; Platelets 193 09/14/2022: BUN 18; Creatinine, Ser 1.05; Potassium 3.9; Sodium 143     Component Value Date/Time   CHOL 134 09/03/2022 0958   CHOL 122 08/06/2012 0849   TRIG 105 09/03/2022 0958   TRIG 130 07/26/2014 0844   TRIG 124 08/06/2012 0849   HDL 58 09/03/2022 0958   HDL 56 07/26/2014 0844   HDL 46 08/06/2012 0849   CHOLHDL 2.3 09/03/2022 0958   LDLCALC 57 09/03/2022 0958   LDLCALC 53 01/01/2014 0837   LDLCALC 51 08/06/2012 0849    Other Studies Reviewed Today: Echocardiogram from 03/16/2022 Normal LVEF Mild MR  Event monitor from 03/13/2022 Normal sinus rhythm with evidence of 638 runs of SVT with the fastest interval lasting 3 mins 21 secs and longest lasting 18 mins 35 secs. These SVT runs are most likely AVNRT but there is a brief irregular SVT which is likely atrial fibrillation. Significant PAC burden of 13.9%. Symptoms correlated with PAC and SVT episodes.   Assessment and Plan: Patient is a 89 year old F known to have HTN, HLD, DM 2, AVNRT, PAC burden 13.9% presented to cardiology clinic for follow-up visit.  # AVNRT # PAC burden, 13.9% -Event monitor from 03/13/2022 showed 638 runs of SVT with fastest interval lasting 3 minutes 21 seconds and longest lasting 18 minutes 35 seconds, most likely AVNRT but there was a brief irregular SVT which could be atrial fibrillation. There was significant PAC burden, 13.9%. Symptoms correlated with PACs and SVT episodes. The atrial fibrillation episode was brief and does not warrant systemic anticoagulation. -Continue metoprolol tartrate 20 mg twice daily, asymptomatic.  # HTN, controlled -Continue p.o.  Lasix 40 mg once daily and lisinopril 40 mg once daily  # Mild mitral regurgitation per echocardiogram from 03/2022 -Echo in 2027, in 3 years  I have spent a total of 30 minutes with patient reviewing chart, EKGs, labs and examining patient as well as establishing an assessment and plan that was discussed with the patient.  > 50% of time was spent in direct patient care.      Medication Adjustments/Labs and Tests Ordered: Current medicines are reviewed at length with the patient today.  Concerns regarding medicines are outlined above.   Tests Ordered: Orders Placed This Encounter  Procedures   EKG 12-Lead    Medication Changes: No orders of the defined types were placed in this encounter.   Disposition:  Follow up  1 year  Signed Jeweline Reif Verne Spurr, MD, 11/07/2022 1:59 PM    [facility] [address] [place], [place], [place] [address]

## 2022-12-12 ENCOUNTER — Other Ambulatory Visit: Payer: Self-pay | Admitting: Family Medicine

## 2022-12-12 DIAGNOSIS — E1122 Type 2 diabetes mellitus with diabetic chronic kidney disease: Secondary | ICD-10-CM

## 2022-12-13 ENCOUNTER — Other Ambulatory Visit: Payer: Self-pay | Admitting: Family Medicine

## 2022-12-13 DIAGNOSIS — M8580 Other specified disorders of bone density and structure, unspecified site: Secondary | ICD-10-CM

## 2022-12-20 DIAGNOSIS — L84 Corns and callosities: Secondary | ICD-10-CM | POA: Diagnosis not present

## 2022-12-20 DIAGNOSIS — B351 Tinea unguium: Secondary | ICD-10-CM | POA: Diagnosis not present

## 2022-12-20 DIAGNOSIS — E1142 Type 2 diabetes mellitus with diabetic polyneuropathy: Secondary | ICD-10-CM | POA: Diagnosis not present

## 2022-12-20 DIAGNOSIS — M79676 Pain in unspecified toe(s): Secondary | ICD-10-CM | POA: Diagnosis not present

## 2022-12-22 ENCOUNTER — Other Ambulatory Visit: Payer: Self-pay | Admitting: Family Medicine

## 2023-01-08 ENCOUNTER — Encounter: Payer: Self-pay | Admitting: Family Medicine

## 2023-01-08 ENCOUNTER — Ambulatory Visit (INDEPENDENT_AMBULATORY_CARE_PROVIDER_SITE_OTHER): Payer: PPO | Admitting: Family Medicine

## 2023-01-08 VITALS — BP 145/84 | HR 78 | Temp 98.6°F | Ht 62.0 in | Wt 141.0 lb

## 2023-01-08 DIAGNOSIS — E785 Hyperlipidemia, unspecified: Secondary | ICD-10-CM

## 2023-01-08 DIAGNOSIS — Z7984 Long term (current) use of oral hypoglycemic drugs: Secondary | ICD-10-CM

## 2023-01-08 DIAGNOSIS — E1169 Type 2 diabetes mellitus with other specified complication: Secondary | ICD-10-CM | POA: Diagnosis not present

## 2023-01-08 DIAGNOSIS — I129 Hypertensive chronic kidney disease with stage 1 through stage 4 chronic kidney disease, or unspecified chronic kidney disease: Secondary | ICD-10-CM | POA: Diagnosis not present

## 2023-01-08 DIAGNOSIS — E119 Type 2 diabetes mellitus without complications: Secondary | ICD-10-CM

## 2023-01-08 DIAGNOSIS — N1831 Chronic kidney disease, stage 3a: Secondary | ICD-10-CM | POA: Diagnosis not present

## 2023-01-08 DIAGNOSIS — E1122 Type 2 diabetes mellitus with diabetic chronic kidney disease: Secondary | ICD-10-CM

## 2023-01-08 LAB — BAYER DCA HB A1C WAIVED: HB A1C (BAYER DCA - WAIVED): 7.3 % — ABNORMAL HIGH (ref 4.8–5.6)

## 2023-01-08 NOTE — Progress Notes (Signed)
Subjective: CC:DM PCP: Raliegh Ip, DO Sara Holmes is a 89 y.o. female presenting to clinic today for:  1. Type 2 Diabetes with hypertension, hyperlipidemia w/ CKD3a:  She reports compliance with all meds. Admits she is not a big fan of meat and that most of her meals consist of carbs/fruit/vegetables.  She reports no hypoglycemic episodes.  No chest pain, shortness of breath, dizziness, polydipsia or polyuria.  Utilizing a cane at baseline for ambulation  Diabetes Health Maintenance Due  Topic Date Due   HEMOGLOBIN A1C  03/05/2023   OPHTHALMOLOGY EXAM  05/31/2023   FOOT EXAM  09/05/2023    Last A1c:  Lab Results  Component Value Date   HGBA1C 7.1 (H) 09/03/2022   ROS: Per HPI  Allergies  Allergen Reactions   Prednisone Other (See Comments)    Elevated blood sugar   Amoxicillin Itching and Rash   Tetanus Toxoids Rash   Past Medical History:  Diagnosis Date   Diabetes mellitus without complication (HCC)    Diverticulosis    Glaucoma    Gout    Hyperlipidemia    Hypertension    Osteopenia    Plantar fasciitis    Sinus bradycardia    Vertigo     Current Outpatient Medications:    allopurinol (ZYLOPRIM) 100 MG tablet, TAKE 1 TABLET EVERY DAY, Disp: 90 tablet, Rfl: 3   blood glucose meter kit and supplies, 1 each by Other route as directed. Dispense ONE TOUCH ULTRA 2. Use once per day fasting. (FOR ICD-10 E10.9, E11.9)., Disp: 1 each, Rfl: 0   Calcium Carbonate-Vitamin D (CALTRATE 600+D PO), Take 1 tablet by mouth 2 (two) times daily., Disp: , Rfl:    Cholecalciferol (VITAMIN D3) 1000 UNITS CAPS, Take 1 tablet by mouth daily., Disp: , Rfl:    clobetasol ointment (TEMOVATE) 0.05 %, Apply topically., Disp: , Rfl:    diclofenac Sodium (VOLTAREN) 1 % GEL, Apply 2 g topically 4 (four) times daily., Disp: 50 g, Rfl: 1   fosinopril (MONOPRIL) 40 MG tablet, TAKE ONE TABLET ONCE DAILY, Disp: 30 tablet, Rfl: 3   furosemide (LASIX) 20 MG tablet, TAKE ONE TABLET  DAILY, Disp: 30 tablet, Rfl: 0   gabapentin (NEURONTIN) 300 MG capsule, TAKE ONE CAPSULE AT BEDTIME, Disp: 30 capsule, Rfl: 3   glucose blood (ONETOUCH ULTRA) test strip, CHECK BLOOD SUGAR ONCE A DAY Dx E11.9, Disp: 100 strip, Rfl: 3   Lancets (ONETOUCH DELICA PLUS LANCET33G) MISC, CHECK BLOOD SUGAR ONCE DAILY OR AS DIRECTED Dx E11.42 (one touch ultra 2), Disp: 100 each, Rfl: 3   meclizine (ANTIVERT) 25 MG tablet, TAKE (1) TABLET THREE TIMES DAILY AS NEEDED DIZZINESS, Disp: 30 tablet, Rfl: 0   metFORMIN (GLUCOPHAGE) 500 MG tablet, Take 1 tablet (500 mg total) by mouth daily with breakfast., Disp: 90 tablet, Rfl: 3   metoprolol tartrate (LOPRESSOR) 25 MG tablet, Take 1 tablet (25 mg total) by mouth 2 (two) times daily., Disp: 180 tablet, Rfl: 1   potassium chloride SA (KLOR-CON M) 20 MEQ tablet, TAKE ONE TABLET DAILY, Disp: 30 tablet, Rfl: 0   raloxifene (EVISTA) 60 MG tablet, TAKE ONE TABLET DAILY, Disp: 90 tablet, Rfl: 0   rosuvastatin (CRESTOR) 10 MG tablet, TAKE 1/2 TABLET DAILY, Disp: 45 tablet, Rfl: 1 Social History   Socioeconomic History   Marital status: Widowed    Spouse name: Not on file   Number of children: Not on file   Years of education: Not on file  Highest education level: Not on file  Occupational History   Occupation: retired  Tobacco Use   Smoking status: Never   Smokeless tobacco: Never  Vaping Use   Vaping status: Never Used  Substance and Sexual Activity   Alcohol use: No   Drug use: No   Sexual activity: Never  Other Topics Concern   Not on file  Social History Narrative   Lives alone   Close friends and family nearby   Social Determinants of Health   Financial Resource Strain: Low Risk  (01/06/2021)   Overall Financial Resource Strain (CARDIA)    Difficulty of Paying Living Expenses: Not hard at all  Food Insecurity: No Food Insecurity (01/06/2021)   Hunger Vital Sign    Worried About Running Out of Food in the Last Year: Never true    Ran Out of  Food in the Last Year: Never true  Transportation Needs: No Transportation Needs (01/06/2021)   PRAPARE - Administrator, Civil Service (Medical): No    Lack of Transportation (Non-Medical): No  Physical Activity: Insufficiently Active (01/06/2021)   Exercise Vital Sign    Days of Exercise per Week: 7 days    Minutes of Exercise per Session: 10 min  Stress: No Stress Concern Present (01/06/2021)   [employer] of Occupational Health - Occupational Stress Questionnaire    Feeling of Stress : Not at all  Social Connections: Moderately Integrated (01/06/2021)   Social Connection and Isolation Panel [NHANES]    Frequency of Communication with Friends and Family: More than three times a week    Frequency of Social Gatherings with Friends and Family: More than three times a week    Attends Religious Services: More than 4 times per year    Active Member of [employer] or Organizations: Yes    Attends Banker Meetings: 1 to 4 times per year    Marital Status: Widowed  Intimate Partner Violence: Not At Risk (01/06/2021)   Humiliation, Afraid, Rape, and Kick questionnaire    Fear of Current or Ex-Partner: No    Emotionally Abused: No    Physically Abused: No    Sexually Abused: No   Family History  Problem Relation Age of Onset   Heart disease Mother    Hypertension Mother    Cancer Father        lymp nodes   Hypertension Brother     Objective: Office vital signs reviewed. BP (!) 145/84 Comment: home BP  Pulse 78   Temp 98.6 F (37 C)   Ht 5\' 2"  (1.575 m)   Wt 141 lb (64 kg)   BMI 25.79 kg/m   Physical Examination:  General: Awake, alert, well nourished, No acute distress HEENT: sclera white, MMM Cardio: regular rate and rhythm, S1S2 heard, no murmurs appreciated Pulm: clear to auscultation bilaterally, no wheezes, rhonchi or rales; normal work of breathing on room air   Assessment/ Plan: 89 y.o. female   Diabetes mellitus treated with oral  medication (HCC) - Plan: Bayer DCA Hb A1c Waived  Hypertension associated with stage 3a chronic kidney disease due to type 2 diabetes mellitus (HCC)  Hyperlipidemia associated with type 2 diabetes mellitus (HCC)  Sugar has risen slightly since last check with A1c of 7.3.  Given that she is over 71 years old I am not overly optimistic about advancing any therapies today.  We discussed carb restriction.  Continue current medications as prescribed.  Will advance medications if needed at next visit to  extended release metformin 500 mg.  Continue ACE inhibitor for renal protection and blood pressure control.  Blood pressure was not initially controlled but home blood pressures are within acceptable range.  She will continue statin as prescribed for cholesterol.  Not yet due for fasting lipid  Raliegh Ip, DO [facility] [phone]

## 2023-01-22 ENCOUNTER — Other Ambulatory Visit: Payer: Self-pay | Admitting: Family Medicine

## 2023-01-22 DIAGNOSIS — E1142 Type 2 diabetes mellitus with diabetic polyneuropathy: Secondary | ICD-10-CM

## 2023-01-22 DIAGNOSIS — N1831 Chronic kidney disease, stage 3a: Secondary | ICD-10-CM

## 2023-01-28 ENCOUNTER — Other Ambulatory Visit: Payer: Self-pay | Admitting: Family Medicine

## 2023-02-18 ENCOUNTER — Other Ambulatory Visit: Payer: Self-pay | Admitting: Family Medicine

## 2023-02-22 ENCOUNTER — Other Ambulatory Visit: Payer: Self-pay | Admitting: Family Medicine

## 2023-02-22 DIAGNOSIS — N1831 Chronic kidney disease, stage 3a: Secondary | ICD-10-CM

## 2023-03-18 ENCOUNTER — Other Ambulatory Visit: Payer: Self-pay | Admitting: Family Medicine

## 2023-03-18 DIAGNOSIS — E1169 Type 2 diabetes mellitus with other specified complication: Secondary | ICD-10-CM

## 2023-03-25 ENCOUNTER — Other Ambulatory Visit: Payer: Self-pay | Admitting: Family Medicine

## 2023-03-25 DIAGNOSIS — M8580 Other specified disorders of bone density and structure, unspecified site: Secondary | ICD-10-CM

## 2023-04-30 ENCOUNTER — Ambulatory Visit (INDEPENDENT_AMBULATORY_CARE_PROVIDER_SITE_OTHER): Payer: PPO | Admitting: Family Medicine

## 2023-04-30 ENCOUNTER — Encounter: Payer: Self-pay | Admitting: Family Medicine

## 2023-04-30 VITALS — BP 160/81 | HR 62 | Temp 98.3°F | Ht 62.0 in | Wt 139.0 lb

## 2023-04-30 DIAGNOSIS — M8580 Other specified disorders of bone density and structure, unspecified site: Secondary | ICD-10-CM | POA: Diagnosis not present

## 2023-04-30 DIAGNOSIS — E1142 Type 2 diabetes mellitus with diabetic polyneuropathy: Secondary | ICD-10-CM

## 2023-04-30 DIAGNOSIS — E1169 Type 2 diabetes mellitus with other specified complication: Secondary | ICD-10-CM

## 2023-04-30 DIAGNOSIS — E119 Type 2 diabetes mellitus without complications: Secondary | ICD-10-CM

## 2023-04-30 DIAGNOSIS — Z0000 Encounter for general adult medical examination without abnormal findings: Secondary | ICD-10-CM

## 2023-04-30 DIAGNOSIS — N1831 Chronic kidney disease, stage 3a: Secondary | ICD-10-CM | POA: Diagnosis not present

## 2023-04-30 DIAGNOSIS — I129 Hypertensive chronic kidney disease with stage 1 through stage 4 chronic kidney disease, or unspecified chronic kidney disease: Secondary | ICD-10-CM

## 2023-04-30 DIAGNOSIS — E1122 Type 2 diabetes mellitus with diabetic chronic kidney disease: Secondary | ICD-10-CM | POA: Diagnosis not present

## 2023-04-30 DIAGNOSIS — Z0001 Encounter for general adult medical examination with abnormal findings: Secondary | ICD-10-CM | POA: Diagnosis not present

## 2023-04-30 DIAGNOSIS — E785 Hyperlipidemia, unspecified: Secondary | ICD-10-CM | POA: Diagnosis not present

## 2023-04-30 DIAGNOSIS — E1159 Type 2 diabetes mellitus with other circulatory complications: Secondary | ICD-10-CM | POA: Diagnosis not present

## 2023-04-30 DIAGNOSIS — Z7984 Long term (current) use of oral hypoglycemic drugs: Secondary | ICD-10-CM

## 2023-04-30 LAB — BAYER DCA HB A1C WAIVED: HB A1C (BAYER DCA - WAIVED): 6.3 % — ABNORMAL HIGH (ref 4.8–5.6)

## 2023-04-30 MED ORDER — METFORMIN HCL 500 MG PO TABS
500.0000 mg | ORAL_TABLET | Freq: Every day | ORAL | 3 refills | Status: AC
Start: 2023-04-30 — End: ?

## 2023-04-30 MED ORDER — ROSUVASTATIN CALCIUM 10 MG PO TABS: ORAL_TABLET | ORAL | 3 refills | Status: AC

## 2023-04-30 MED ORDER — RALOXIFENE HCL 60 MG PO TABS: 60.0000 mg | ORAL_TABLET | Freq: Every day | ORAL | 3 refills | Status: AC

## 2023-04-30 MED ORDER — ALLOPURINOL 100 MG PO TABS: 100.0000 mg | ORAL_TABLET | Freq: Every day | ORAL | 3 refills | Status: AC

## 2023-04-30 NOTE — Progress Notes (Signed)
Sara Holmes is a 89 y.o. female presents to office today for annual physical exam examination.    Concerns today include: 1. Type 2 Diabetes with hypertension, hyperlipidemia w/ CKD3a:  Patient is compliant with her medications.  She monitors blood sugars daily.  Denies any hypoglycemic episodes.  Last eye exam: Up-to-date Last foot exam: Needs Last A1c:  Lab Results  Component Value Date   HGBA1C 7.3 (H) 01/08/2023   Nephropathy screen indicated?:  Up-to-date Last flu, zoster and/or pneumovax:  Immunization History  Administered Date(s) Administered   Fluad Quad(high Dose 65+) 12/14/2019, 12/26/2020, 01/23/2022   Influenza Whole 12/28/2008   Influenza, High Dose Seasonal PF 12/11/2016, 12/20/2017   Influenza,inj,Quad PF,6+ Mos 12/03/2012, 01/01/2014, 12/31/2014, 11/28/2015   Influenza,inj,quad, With Preservative 12/03/2018   Influenza-Unspecified 12/04/2018, 12/20/2022   Moderna Covid-19 Fall Seasonal Vaccine 106yrs & older 03/08/2022, 01/14/2023   Moderna Covid-19 Vaccine Bivalent Booster 27yrs & up 12/13/2020   Moderna Sars-Covid-2 Vaccination 05/11/2019, 06/08/2019, 01/26/2020, 06/21/2020   Pneumococcal Polysaccharide-23 03/31/2010   Zoster Recombinant(Shingrix) 11/07/2016, 04/01/2017   Zoster, Live 01/28/2007    ROS: Denies dizziness, LOC, polyuria, polydipsia, unintended weight loss/gain, foot ulcerations, numbness or tingling in extremities, shortness of breath or chest pain.   Occupation: Retired, Marital status: Single, Jory Ee, a family friend helps her when needed.  Substance use: None There are no preventive care reminders to display for this patient.  Refills needed today: All  Immunization History  Administered Date(s) Administered   Fluad Quad(high Dose 65+) 12/14/2019, 12/26/2020, 01/23/2022   Influenza Whole 12/28/2008   Influenza, High Dose Seasonal PF 12/11/2016, 12/20/2017   Influenza,inj,Quad PF,6+ Mos 12/03/2012, 01/01/2014, 12/31/2014,  11/28/2015   Influenza,inj,quad, With Preservative 12/03/2018   Influenza-Unspecified 12/04/2018, 12/20/2022   Moderna Covid-19 Fall Seasonal Vaccine 10yrs & older 03/08/2022, 01/14/2023   Moderna Covid-19 Vaccine Bivalent Booster 33yrs & up 12/13/2020   Moderna Sars-Covid-2 Vaccination 05/11/2019, 06/08/2019, 01/26/2020, 06/21/2020   Pneumococcal Polysaccharide-23 03/31/2010   Zoster Recombinant(Shingrix) 11/07/2016, 04/01/2017   Zoster, Live 01/28/2007   Past Medical History:  Diagnosis Date   Diabetes mellitus without complication (HCC)    Diverticulosis    Glaucoma    Gout    Hyperlipidemia    Hypertension    Osteopenia    Plantar fasciitis    Sinus bradycardia    Vertigo    Social History   Socioeconomic History   Marital status: Widowed    Spouse name: Not on file   Number of children: Not on file   Years of education: Not on file   Highest education level: Not on file  Occupational History   Occupation: retired  Tobacco Use   Smoking status: Never   Smokeless tobacco: Never  Vaping Use   Vaping status: Never Used  Substance and Sexual Activity   Alcohol use: No   Drug use: No   Sexual activity: Never  Other Topics Concern   Not on file  Social History Narrative   Lives alone   Close friends and family nearby   Social Drivers of Health   Financial Resource Strain: Low Risk  (01/06/2021)   Overall Financial Resource Strain (CARDIA)    Difficulty of Paying Living Expenses: Not hard at all  Food Insecurity: No Food Insecurity (01/06/2021)   Hunger Vital Sign    Worried About Running Out of Food in the Last Year: Never true    Ran Out of Food in the Last Year: Never true  Transportation Needs: No Transportation Needs (01/06/2021)  PRAPARE - Administrator, Civil Service (Medical): No    Lack of Transportation (Non-Medical): No  Physical Activity: Insufficiently Active (01/06/2021)   Exercise Vital Sign    Days of Exercise per Week: 7 days     Minutes of Exercise per Session: 10 min  Stress: No Stress Concern Present (01/06/2021)   [employer] of Occupational Health - Occupational Stress Questionnaire    Feeling of Stress : Not at all  Social Connections: Moderately Integrated (01/06/2021)   Social Connection and Isolation Panel [NHANES]    Frequency of Communication with Friends and Family: More than three times a week    Frequency of Social Gatherings with Friends and Family: More than three times a week    Attends Religious Services: More than 4 times per year    Active Member of [employer] or Organizations: Yes    Attends Banker Meetings: 1 to 4 times per year    Marital Status: Widowed  Intimate Partner Violence: Not At Risk (01/06/2021)   Humiliation, Afraid, Rape, and Kick questionnaire    Fear of Current or Ex-Partner: No    Emotionally Abused: No    Physically Abused: No    Sexually Abused: No   Past Surgical History:  Procedure Laterality Date   ABDOMINAL HYSTERECTOMY     CHOLECYSTECTOMY     EYE SURGERY     cataracts   NASAL SEPTUM SURGERY     Family History  Problem Relation Age of Onset   Heart disease Mother    Hypertension Mother    Cancer Father        lymp nodes   Hypertension Brother     Current Outpatient Medications:    blood glucose meter kit and supplies, 1 each by Other route as directed. Dispense ONE TOUCH ULTRA 2. Use once per day fasting. (FOR ICD-10 E10.9, E11.9)., Disp: 1 each, Rfl: 0   Calcium Carbonate-Vitamin D (CALTRATE 600+D PO), Take 1 tablet by mouth 2 (two) times daily., Disp: , Rfl:    Cholecalciferol (VITAMIN D3) 1000 UNITS CAPS, Take 1 tablet by mouth daily., Disp: , Rfl:    clobetasol ointment (TEMOVATE) 0.05 %, Apply topically., Disp: , Rfl:    diclofenac Sodium (VOLTAREN) 1 % GEL, Apply 2 g topically 4 (four) times daily., Disp: 50 g, Rfl: 1   fosinopril (MONOPRIL) 40 MG tablet, TAKE ONE TABLET ONCE DAILY FOR BLOOD PRESSURE}, Disp: 30 tablet, Rfl: 3    furosemide (LASIX) 20 MG tablet, TAKE ONE TABLET DAILY FOR FLUID, Disp: 30 tablet, Rfl: 3   gabapentin (NEURONTIN) 300 MG capsule, TAKE ONE CAPSULE AT BEDTIME, Disp: 30 capsule, Rfl: 3   glucose blood (ONETOUCH ULTRA) test strip, CHECK BLOOD SUGAR ONCE A DAY Dx E11.9, Disp: 100 strip, Rfl: 3   Lancets (ONETOUCH DELICA PLUS LANCET33G) MISC, CHECK BLOOD SUGAR ONCE A DAY Dx E11.9, Disp: 100 each, Rfl: 3   meclizine (ANTIVERT) 25 MG tablet, TAKE (1) TABLET THREE TIMES DAILY AS NEEDED DIZZINESS, Disp: 30 tablet, Rfl: 0   metoprolol tartrate (LOPRESSOR) 25 MG tablet, Take 1 tablet (25 mg total) by mouth 2 (two) times daily., Disp: 180 tablet, Rfl: 1   potassium chloride SA (KLOR-CON M) 20 MEQ tablet, TAKE ONE TABLET DAILY, Disp: 30 tablet, Rfl: 0   allopurinol (ZYLOPRIM) 100 MG tablet, Take 1 tablet (100 mg total) by mouth daily., Disp: 90 tablet, Rfl: 3   metFORMIN (GLUCOPHAGE) 500 MG tablet, Take 1 tablet (500 mg total) by mouth daily  with breakfast., Disp: 90 tablet, Rfl: 3   raloxifene (EVISTA) 60 MG tablet, Take 1 tablet (60 mg total) by mouth daily., Disp: 90 tablet, Rfl: 3   rosuvastatin (CRESTOR) 10 MG tablet, TAKE 1/2 TABLET DAILY FOR CHOLESTEROL, Disp: 45 tablet, Rfl: 3  Allergies  Allergen Reactions   Prednisone Other (See Comments)    Elevated blood sugar   Amoxicillin Itching and Rash   Tetanus Toxoids Rash     ROS: Review of Systems Pertinent items noted in HPI and remainder of comprehensive ROS otherwise negative.    Physical exam BP (!) 160/81   Pulse 62   Temp 98.3 F (36.8 C)   Ht 5\' 2"  (1.575 m)   Wt 139 lb (63 kg)   SpO2 96%   BMI 25.42 kg/m  General appearance: alert, cooperative, appears stated age, and no distress Head: Normocephalic, without obvious abnormality, atraumatic Eyes: negative findings: lids and lashes normal, conjunctivae and sclerae normal, corneas clear, and pupils equal, round, reactive to light and accomodation Ears: normal TM's and external ear  canals both ears Nose: Nares normal. Septum midline. Mucosa normal. No drainage or sinus tenderness. Throat: lips, mucosa, and tongue normal; teeth and gums normal Neck: no adenopathy, supple, symmetrical, trachea midline, and thyroid not enlarged, symmetric, no tenderness/mass/nodules Back: Increased thoracic curvature present. ROM somewhat reduced. No CVA tenderness. Lungs: clear to auscultation bilaterally Heart: regular rate and rhythm, S1, S2 normal, no murmur, click, rub or gallop Abdomen: soft, non-tender; bowel sounds normal; no masses,  no organomegaly Extremities:  Warm, well-perfused.  Trace ankle edema present left greater than right Pulses: 2+ and symmetric Skin:  Senile purpura present otherwise unremarkable Lymph nodes: Cervical, supraclavicular, and axillary nodes normal. Neurologic: Somewhat hard of hearing.  Wearing a hearing aid on the left.  Uses cane for ambulation     04/30/2023    8:09 AM 01/08/2023    8:16 AM 09/05/2022    2:16 PM  Depression screen PHQ 2/9  Decreased Interest 0 0 0  Down, Depressed, Hopeless 0 0 0  PHQ - 2 Score 0 0 0  Altered sleeping 0 0 0  Tired, decreased energy 0 0 0  Change in appetite 0 0 0  Feeling bad or failure about yourself  0 0 0  Trouble concentrating 0 0 0  Moving slowly or fidgety/restless 0 0 0  Suicidal thoughts 0 0 0  PHQ-9 Score 0 0 0  Difficult doing work/chores Not difficult at all Not difficult at all       04/30/2023    8:08 AM 01/08/2023    8:16 AM 09/05/2022    2:16 PM 05/04/2022   10:43 AM  GAD 7 : Generalized Anxiety Score  Nervous, Anxious, on Edge 0 0 0 0  Control/stop worrying 0 0 0 0  Worry too much - different things 0 0 0 0  Trouble relaxing 0 0 0 0  Restless 0 0 0 0  Easily annoyed or irritable 0 0 0 0  Afraid - awful might happen 0 0 0 0  Total GAD 7 Score 0 0 0 0  Anxiety Difficulty Not difficult at all Not difficult at all  Not difficult at all     Assessment/ Plan: Azell Der here for  annual physical exam.   Annual physical exam  Diabetes mellitus treated with oral medication (HCC) - Plan: Bayer DCA Hb A1c Waived, CMP14+EGFR, metFORMIN (GLUCOPHAGE) 500 MG tablet  Hyperlipidemia associated with type 2 diabetes mellitus (HCC) -  Plan: CMP14+EGFR, rosuvastatin (CRESTOR) 10 MG tablet  Diabetic polyneuropathy associated with type 2 diabetes mellitus (HCC) - Plan: CMP14+EGFR  Hypertension associated with stage 3a chronic kidney disease due to type 2 diabetes mellitus (HCC) - Plan: CMP14+EGFR, VITAMIN D 25 Hydroxy (Vit-D Deficiency, Fractures)  Osteopenia with high risk of fracture - Plan: raloxifene (EVISTA) 60 MG tablet  Sugar at goal for age.  Continue current regimen with metformin.  This been renewed.  Check renal function given CKD 3A  Statin renewed.  Not yet due for fasting lipid.  Blood pressure is not at goal.  I would like her to have a recheck with nurse in 2 weeks.  Check renal function, electrolytes.  Check vitamin D given CKD  Evista renewed for osteopenia with high risk of fracture.  She has been chronic and stable on this medicine.  Counseled on healthy lifestyle choices, including diet (rich in fruits, vegetables and lean meats and low in salt and simple carbohydrates) and exercise (at least 30 minutes of moderate physical activity daily).  Patient to follow up 3 to 6 months, sooner if concerns arise  Jeannene Tschetter M. Nadine Counts, DO

## 2023-05-01 ENCOUNTER — Encounter: Payer: Self-pay | Admitting: Family Medicine

## 2023-05-01 LAB — CMP14+EGFR
ALT: 10 [IU]/L (ref 0–32)
AST: 21 [IU]/L (ref 0–40)
Albumin: 4 g/dL (ref 3.6–4.6)
Alkaline Phosphatase: 59 [IU]/L (ref 44–121)
BUN/Creatinine Ratio: 16 (ref 12–28)
BUN: 17 mg/dL (ref 10–36)
Bilirubin Total: 1.1 mg/dL (ref 0.0–1.2)
CO2: 25 mmol/L (ref 20–29)
Calcium: 9.1 mg/dL (ref 8.7–10.3)
Chloride: 101 mmol/L (ref 96–106)
Creatinine, Ser: 1.08 mg/dL — ABNORMAL HIGH (ref 0.57–1.00)
Globulin, Total: 1.8 g/dL (ref 1.5–4.5)
Glucose: 159 mg/dL — ABNORMAL HIGH (ref 70–99)
Potassium: 3.5 mmol/L (ref 3.5–5.2)
Sodium: 144 mmol/L (ref 134–144)
Total Protein: 5.8 g/dL — ABNORMAL LOW (ref 6.0–8.5)
eGFR: 48 mL/min/{1.73_m2} — ABNORMAL LOW (ref >59)

## 2023-05-01 LAB — VITAMIN D 25 HYDROXY (VIT D DEFICIENCY, FRACTURES): Vit D, 25-Hydroxy: 43.7 ng/mL (ref 30.0–100.0)

## 2023-05-20 ENCOUNTER — Other Ambulatory Visit: Payer: Self-pay | Admitting: Family Medicine

## 2023-05-20 DIAGNOSIS — N1831 Chronic kidney disease, stage 3a: Secondary | ICD-10-CM

## 2023-05-20 DIAGNOSIS — E1142 Type 2 diabetes mellitus with diabetic polyneuropathy: Secondary | ICD-10-CM

## 2023-06-17 ENCOUNTER — Other Ambulatory Visit: Payer: Self-pay | Admitting: Family Medicine

## 2023-08-28 ENCOUNTER — Ambulatory Visit: Payer: PPO | Admitting: Family Medicine

## 2023-08-28 ENCOUNTER — Encounter: Payer: Self-pay | Admitting: Family Medicine

## 2023-08-28 VITALS — BP 154/82 | HR 60 | Temp 98.2°F | Ht 62.0 in | Wt 136.0 lb

## 2023-08-28 DIAGNOSIS — Z23 Encounter for immunization: Secondary | ICD-10-CM

## 2023-08-28 DIAGNOSIS — E1122 Type 2 diabetes mellitus with diabetic chronic kidney disease: Secondary | ICD-10-CM | POA: Diagnosis not present

## 2023-08-28 DIAGNOSIS — I129 Hypertensive chronic kidney disease with stage 1 through stage 4 chronic kidney disease, or unspecified chronic kidney disease: Secondary | ICD-10-CM

## 2023-08-28 DIAGNOSIS — E1169 Type 2 diabetes mellitus with other specified complication: Secondary | ICD-10-CM

## 2023-08-28 DIAGNOSIS — H0014 Chalazion left upper eyelid: Secondary | ICD-10-CM

## 2023-08-28 DIAGNOSIS — N1831 Chronic kidney disease, stage 3a: Secondary | ICD-10-CM | POA: Diagnosis not present

## 2023-08-28 DIAGNOSIS — E785 Hyperlipidemia, unspecified: Secondary | ICD-10-CM | POA: Diagnosis not present

## 2023-08-28 DIAGNOSIS — E1159 Type 2 diabetes mellitus with other circulatory complications: Secondary | ICD-10-CM | POA: Diagnosis not present

## 2023-08-28 DIAGNOSIS — E1142 Type 2 diabetes mellitus with diabetic polyneuropathy: Secondary | ICD-10-CM | POA: Diagnosis not present

## 2023-08-28 DIAGNOSIS — E119 Type 2 diabetes mellitus without complications: Secondary | ICD-10-CM

## 2023-08-28 DIAGNOSIS — Z7984 Long term (current) use of oral hypoglycemic drugs: Secondary | ICD-10-CM

## 2023-08-28 LAB — BAYER DCA HB A1C WAIVED: HB A1C (BAYER DCA - WAIVED): 7.8 % — ABNORMAL HIGH (ref 4.8–5.6)

## 2023-08-28 MED ORDER — ERYTHROMYCIN 5 MG/GM OP OINT
1.0000 | TOPICAL_OINTMENT | Freq: Three times a day (TID) | OPHTHALMIC | 0 refills | Status: AC
Start: 2023-08-28 — End: 2023-09-02

## 2023-08-28 NOTE — Patient Instructions (Addendum)
 Watch your sugar intake.  Your sugar has gone up a bit since last time. We talked about protein foods that aren't meat today: greek yogurt, cottage cheese, beans.  Chalazion  A chalazion is a swelling or lump on the eyelid. It can affect the upper eyelid or the lower eyelid. What are the causes? This condition may be caused by: Long-lasting (chronic) inflammation of the eyelid glands. A blocked oil gland in the eyelid. What are the signs or symptoms? Symptoms of this condition include: Swelling of the eyelid that: May spread to areas around the eye. May be painful. A hard lump on the eyelid. Blurry vision. The lump may make it hard to see out of the eye. How is this diagnosed? This condition is diagnosed with an examination of the eye. How is this treated? This condition is treated by applying a warm, moist cloth (warm compress) to the eyelid. If the condition does not improve, it may be treated with: Medicine that is applied to the eye. Oral medicines. Medicine that is injected into the chalazion. Surgery. Follow these instructions at home: Managing pain and swelling Apply a warm compress to the eyelid for 10-15 minutes, 4 to 6 times a day. This will help to open any blocked glands and to reduce redness and swelling. Take and apply over-the-counter and prescription medicines only as told by your health care provider. General instructions Do not touch the chalazion. Do not try to remove the pus. Do not squeeze the chalazion or stick it with a pin or needle. Do not rub your eyes. Wash your hands often with soap and water for at least 20 seconds. Dry your hands with a clean towel. Keep your face, scalp, and eyebrows clean. Avoid wearing eye makeup. Keep all follow-up visits. This is important. Contact a health care provider if: Your eyelid is getting worse. You have a fever. The chalazion does not break open (rupture) or go away on its own and your eyelid has not improved for 4  weeks. Get help right away if: You have pain in your eye. Your vision worsens. The chalazion becomes painful or red. The chalazion gets bigger. Summary A chalazion is a swelling or lump on the upper or lower eyelid. It may be caused by chronic inflammation or a blocked oil gland. Apply a warm compress to the eyelid for 10-15 minutes, 4 to 6 times a day. Keep your face, scalp, and eyebrows clean. This information is not intended to replace advice given to you by your health care provider. Make sure you discuss any questions you have with your health care provider. Document Revised: 05/04/2020 Document Reviewed: 05/04/2020 Elsevier Patient Education  2024 [employer].

## 2023-08-28 NOTE — Progress Notes (Signed)
 Subjective: CC:DM PCP: Eliodoro Guerin, DO [id number] Sara Holmes is a 89 y.o. female presenting to clinic today for:  1. Type 2 Diabetes with hypertension, hyperlipidemia w/ CKD3a and neuropathy:  She is compliant with her medications.  She admits that she has not been as restrictive with sugar lately.  She does not have a taste for meat but likes vegetables.  She has an appointment with Dr. Candi Chafe for her eye exam soon.  Diabetes Health Maintenance Due  Topic Date Due   OPHTHALMOLOGY EXAM  05/31/2023   FOOT EXAM  09/05/2023   HEMOGLOBIN A1C  10/28/2023    Last A1c:  Lab Results  Component Value Date   HGBA1C 6.3 (H) 04/30/2023    ROS: No chest pain, shortness of breath or falls.  2.  Eyelid lesion She reports that she has a stye on the left eyelid that is been there for the last couple of days.  No visual disturbance.  No fevers.  Not sure what to use on there so has not been doing thing for just yet  ROS: Per HPI  Allergies  Allergen Reactions   Prednisone  Other (See Comments)    Elevated blood sugar   Amoxicillin Itching and Rash   Tetanus Toxoids Rash   Past Medical History:  Diagnosis Date   Diabetes mellitus without complication (HCC)    Diverticulosis    Glaucoma    Gout    Hyperlipidemia    Hypertension    Osteopenia    Plantar fasciitis    Sinus bradycardia    Vertigo     Current Outpatient Medications:    allopurinol  (ZYLOPRIM ) 100 MG tablet, Take 1 tablet (100 mg total) by mouth daily., Disp: 90 tablet, Rfl: 3   blood glucose meter kit and supplies, 1 each by Other route as directed. Dispense ONE TOUCH ULTRA 2. Use once per day fasting. (FOR ICD-10 E10.9, E11.9)., Disp: 1 each, Rfl: 0   Calcium  Carbonate-Vitamin D  (CALTRATE 600+D PO), Take 1 tablet by mouth 2 (two) times daily., Disp: , Rfl:    Cholecalciferol (VITAMIN D3) 1000 UNITS CAPS, Take 1 tablet by mouth daily., Disp: , Rfl:    clobetasol ointment (TEMOVATE) 0.05 %, Apply topically., Disp:  , Rfl:    diclofenac  Sodium (VOLTAREN ) 1 % GEL, Apply 2 g topically 4 (four) times daily., Disp: 50 g, Rfl: 1   erythromycin ophthalmic ointment, Place 1 Application into the left eye 3 (three) times daily for 5 days., Disp: 3.5 g, Rfl: 0   fosinopril  (MONOPRIL ) 40 MG tablet, TAKE ONE TABLET ONCE DAILY FOR BLOOD PRESSURE}, Disp: 30 tablet, Rfl: 3   furosemide  (LASIX ) 20 MG tablet, TAKE ONE TABLET DAILY FOR FLUID, Disp: 30 tablet, Rfl: 2   gabapentin  (NEURONTIN ) 300 MG capsule, TAKE ONE CAPSULE AT BEDTIME, Disp: 30 capsule, Rfl: 3   glucose blood (ONETOUCH ULTRA) test strip, CHECK BLOOD SUGAR ONCE A DAY Dx E11.9, Disp: 100 strip, Rfl: 3   Lancets (ONETOUCH DELICA PLUS LANCET33G) MISC, CHECK BLOOD SUGAR ONCE A DAY Dx E11.9, Disp: 100 each, Rfl: 3   meclizine  (ANTIVERT ) 25 MG tablet, TAKE (1) TABLET THREE TIMES DAILY AS NEEDED DIZZINESS, Disp: 30 tablet, Rfl: 0   metFORMIN  (GLUCOPHAGE ) 500 MG tablet, Take 1 tablet (500 mg total) by mouth daily with breakfast., Disp: 90 tablet, Rfl: 3   metoprolol  tartrate (LOPRESSOR ) 25 MG tablet, Take 1 tablet (25 mg total) by mouth 2 (two) times daily., Disp: 180 tablet, Rfl: 1   potassium  chloride SA (KLOR-CON  M) 20 MEQ tablet, TAKE ONE TABLET DAILY, Disp: 30 tablet, Rfl: 0   raloxifene  (EVISTA ) 60 MG tablet, Take 1 tablet (60 mg total) by mouth daily., Disp: 90 tablet, Rfl: 3   rosuvastatin  (CRESTOR ) 10 MG tablet, TAKE 1/2 TABLET DAILY FOR CHOLESTEROL, Disp: 45 tablet, Rfl: 3 Social History   Socioeconomic History   Marital status: Widowed    Spouse name: Not on file   Number of children: Not on file   Years of education: Not on file   Highest education level: Not on file  Occupational History   Occupation: retired  Tobacco Use   Smoking status: Never   Smokeless tobacco: Never  Vaping Use   Vaping status: Never Used  Substance and Sexual Activity   Alcohol use: No   Drug use: No   Sexual activity: Never  Other Topics Concern   Not on file  Social  History Narrative   Lives alone   Close friends and family nearby   Social Drivers of Health   Financial Resource Strain: Low Risk  (01/06/2021)   Overall Financial Resource Strain (CARDIA)    Difficulty of Paying Living Expenses: Not hard at all  Food Insecurity: No Food Insecurity (01/06/2021)   Hunger Vital Sign    Worried About Running Out of Food in the Last Year: Never true    Ran Out of Food in the Last Year: Never true  Transportation Needs: No Transportation Needs (01/06/2021)   PRAPARE - Administrator, Civil Service (Medical): No    Lack of Transportation (Non-Medical): No  Physical Activity: Insufficiently Active (01/06/2021)   Exercise Vital Sign    Days of Exercise per Week: 7 days    Minutes of Exercise per Session: 10 min  Stress: No Stress Concern Present (01/06/2021)   [employer] of Occupational Health - Occupational Stress Questionnaire    Feeling of Stress : Not at all  Social Connections: Moderately Integrated (01/06/2021)   Social Connection and Isolation Panel    Frequency of Communication with Friends and Family: More than three times a week    Frequency of Social Gatherings with Friends and Family: More than three times a week    Attends Religious Services: More than 4 times per year    Active Member of [employer] or Organizations: Yes    Attends Banker Meetings: 1 to 4 times per year    Marital Status: Widowed  Intimate Partner Violence: Not At Risk (01/06/2021)   Humiliation, Afraid, Rape, and Kick questionnaire    Fear of Current or Ex-Partner: No    Emotionally Abused: No    Physically Abused: No    Sexually Abused: No   Family History  Problem Relation Age of Onset   Heart disease Mother    Hypertension Mother    Cancer Father        lymp nodes   Hypertension Brother     Objective: Office vital signs reviewed. BP (!) 154/82   Pulse 60   Temp 98.2 F (36.8 C)   Ht 5' 2 (1.575 m)   Wt 136 lb (61.7 kg)    SpO2 90%   BMI 24.87 kg/m   Physical Examination:  General: Awake, alert, well nourished, No acute distress HEENT: Left upper eyelid with chalazion.  No conjunctival injection.  No periorbital swelling Cardio: regular rate and rhythm, S1S2 heard, no murmurs appreciated Pulm: clear to auscultation bilaterally, no wheezes, rhonchi or rales; normal work of breathing  on room air MSK: Utilizing cane for ambulation   Assessment/ Plan: 89 y.o. female   Diabetes mellitus treated with oral medication (HCC) - Plan: Bayer DCA Hb A1c Waived  Hypertension associated with stage 3a chronic kidney disease due to type 2 diabetes mellitus (HCC) - Plan: Basic Metabolic Panel  Hyperlipidemia associated with type 2 diabetes mellitus (HCC)  Diabetic polyneuropathy associated with type 2 diabetes mellitus (HCC)  Chalazion left upper eyelid - Plan: erythromycin ophthalmic ointment  A1c acceptable given that she is over 16 years old.  However, it has risen quite sharply so I cautioned her to monitor her sugar intake.  We talked about protein alternatives to meet today.  She will set up her diabetic eye exam  We checked renal function today given CKD 3A.  Her blood pressure was NOT controlled upon recheck.  She was sent home with a log for monitoring.  She will continue all medications as prescribed.  Not yet due for fasting lipid.  Neuropathy is chronic and stable.  Diabetic foot exam at next visit  Erythromycin for chalazion.  Discussed warm compresses and home care.  Okay to follow-up in 4 months, sooner if concerns arise  Eliodoro Guerin, DO [facility] [phone]

## 2023-08-29 LAB — BASIC METABOLIC PANEL WITH GFR
BUN/Creatinine Ratio: 16 (ref 12–28)
BUN: 17 mg/dL (ref 10–36)
CO2: 24 mmol/L (ref 20–29)
Calcium: 9.1 mg/dL (ref 8.7–10.3)
Chloride: 101 mmol/L (ref 96–106)
Creatinine, Ser: 1.08 mg/dL — ABNORMAL HIGH (ref 0.57–1.00)
Glucose: 182 mg/dL — ABNORMAL HIGH (ref 70–99)
Potassium: 3.7 mmol/L (ref 3.5–5.2)
Sodium: 143 mmol/L (ref 134–144)
eGFR: 48 mL/min/{1.73_m2} — ABNORMAL LOW (ref >59)

## 2023-08-30 ENCOUNTER — Ambulatory Visit: Payer: Self-pay | Admitting: Family Medicine

## 2023-09-04 ENCOUNTER — Telehealth: Payer: Self-pay | Admitting: Family Medicine

## 2023-09-04 NOTE — Telephone Encounter (Signed)
 Spoke to pt and stye is better but still slightly visable. Instructed to continue warm compress until it goes away since it is getting better. Reach out if it progresses.

## 2023-09-04 NOTE — Telephone Encounter (Signed)
 Copied from CRM [id number]. Topic: Clinical - Medical Advice >> Sep 04, 2023  8:15 AM Myrick T wrote: Reason for CRM: patient called stated she was advised to call back if the stye on her eye did not get better. Patient said its better but she wants to speak with the nurse or provider to make sure she does not need to come in. Please f/u with patient

## 2023-09-09 ENCOUNTER — Other Ambulatory Visit: Payer: Self-pay | Admitting: Family Medicine

## 2023-09-09 DIAGNOSIS — N1831 Chronic kidney disease, stage 3a: Secondary | ICD-10-CM

## 2023-09-18 ENCOUNTER — Other Ambulatory Visit: Payer: Self-pay | Admitting: Family Medicine

## 2023-09-18 DIAGNOSIS — E1142 Type 2 diabetes mellitus with diabetic polyneuropathy: Secondary | ICD-10-CM

## 2023-09-19 ENCOUNTER — Ambulatory Visit (INDEPENDENT_AMBULATORY_CARE_PROVIDER_SITE_OTHER)

## 2023-09-19 DIAGNOSIS — E119 Type 2 diabetes mellitus without complications: Secondary | ICD-10-CM

## 2023-09-19 DIAGNOSIS — Z7984 Long term (current) use of oral hypoglycemic drugs: Secondary | ICD-10-CM

## 2023-09-19 LAB — HM DIABETES EYE EXAM

## 2023-09-19 NOTE — Progress Notes (Signed)
 Sara Holmes arrived 09/19/2023 and has given verbal consent to obtain images and complete their overdue diabetic retinal screening.  The images have been sent to an ophthalmologist or optometrist for review and interpretation.  Results will be sent back to Jolinda Norene HERO, DO for review.  Patient has been informed they will be contacted when we receive the results via telephone or MyChart

## 2023-09-26 ENCOUNTER — Telehealth: Payer: Self-pay | Admitting: Family Medicine

## 2023-09-26 NOTE — Telephone Encounter (Signed)
 I called and spoke with patient regarding her Diabetic Retinal Screening. Made patient aware that per her result notes from the Ophthalmologist, there were no signs of diabetic retinopathy but patient could be a glaucoma suspect. I asked patient if she currently has an eye doctor and she says it used to be Dr Octavia but he retired and someone is there replacing him and she does have an upcoming at their office and will ask to be checked for Glaucoma.

## 2023-10-02 ENCOUNTER — Ambulatory Visit: Payer: Self-pay | Admitting: Family Medicine

## 2023-10-21 ENCOUNTER — Other Ambulatory Visit: Payer: Self-pay | Admitting: Internal Medicine

## 2023-12-11 DIAGNOSIS — H40013 Open angle with borderline findings, low risk, bilateral: Secondary | ICD-10-CM | POA: Diagnosis not present

## 2023-12-11 DIAGNOSIS — E119 Type 2 diabetes mellitus without complications: Secondary | ICD-10-CM | POA: Diagnosis not present

## 2023-12-11 LAB — OPHTHALMOLOGY REPORT-SCANNED

## 2023-12-12 ENCOUNTER — Encounter: Payer: Self-pay | Admitting: Family Medicine

## 2023-12-16 ENCOUNTER — Other Ambulatory Visit: Payer: Self-pay | Admitting: Family Medicine

## 2023-12-16 DIAGNOSIS — E1122 Type 2 diabetes mellitus with diabetic chronic kidney disease: Secondary | ICD-10-CM

## 2024-01-01 ENCOUNTER — Other Ambulatory Visit: Payer: Self-pay | Admitting: Family Medicine

## 2024-01-01 DIAGNOSIS — N1831 Chronic kidney disease, stage 3a: Secondary | ICD-10-CM

## 2024-01-08 ENCOUNTER — Ambulatory Visit: Admitting: Family Medicine

## 2024-01-08 ENCOUNTER — Encounter: Payer: Self-pay | Admitting: Family Medicine

## 2024-01-08 VITALS — BP 135/85 | HR 72 | Temp 97.2°F | Ht 62.0 in | Wt 132.5 lb

## 2024-01-08 DIAGNOSIS — E785 Hyperlipidemia, unspecified: Secondary | ICD-10-CM

## 2024-01-08 DIAGNOSIS — N1831 Chronic kidney disease, stage 3a: Secondary | ICD-10-CM | POA: Diagnosis not present

## 2024-01-08 DIAGNOSIS — Z23 Encounter for immunization: Secondary | ICD-10-CM

## 2024-01-08 DIAGNOSIS — I129 Hypertensive chronic kidney disease with stage 1 through stage 4 chronic kidney disease, or unspecified chronic kidney disease: Secondary | ICD-10-CM

## 2024-01-08 DIAGNOSIS — E1122 Type 2 diabetes mellitus with diabetic chronic kidney disease: Secondary | ICD-10-CM

## 2024-01-08 DIAGNOSIS — E1169 Type 2 diabetes mellitus with other specified complication: Secondary | ICD-10-CM | POA: Diagnosis not present

## 2024-01-08 DIAGNOSIS — E119 Type 2 diabetes mellitus without complications: Secondary | ICD-10-CM

## 2024-01-08 DIAGNOSIS — Z7984 Long term (current) use of oral hypoglycemic drugs: Secondary | ICD-10-CM | POA: Diagnosis not present

## 2024-01-08 LAB — BAYER DCA HB A1C WAIVED: HB A1C (BAYER DCA - WAIVED): 6.3 % — ABNORMAL HIGH (ref 4.8–5.6)

## 2024-01-08 LAB — LIPID PANEL

## 2024-01-08 NOTE — Progress Notes (Signed)
 Subjective: CC:DM PCP: Jolinda Norene HERO, DO YEP:Fjmpz Sara Holmes is a 89 y.o. female presenting to clinic today for:  Type 2 Diabetes with hypertension, hyperlipidemia w/ CKD3a:  No hypoglycemic episodes.  She is compliant with all medications.  No falls.  Continues to use cane for ambulation.  ROS: Denies dizziness, LOC, polyuria, polydipsia, unintended weight loss/gain, foot ulcerations, numbness or tingling in extremities, shortness of breath or chest pain.   Diabetes Health Maintenance Due  Topic Date Due   FOOT EXAM  09/05/2023   HEMOGLOBIN A1C  02/27/2024   OPHTHALMOLOGY EXAM  12/10/2024    ROS: Per HPI  Allergies  Allergen Reactions   Prednisone  Other (See Comments)    Elevated blood sugar   Amoxicillin Itching and Rash   Tetanus Toxoid-Containing Vaccines Rash   Past Medical History:  Diagnosis Date   Diabetes mellitus without complication (HCC)    Diverticulosis    Glaucoma    Gout    Hyperlipidemia    Hypertension    Osteopenia    Plantar fasciitis    Sinus bradycardia    Vertigo     Current Outpatient Medications:    allopurinol  (ZYLOPRIM ) 100 MG tablet, Take 1 tablet (100 mg total) by mouth daily., Disp: 90 tablet, Rfl: 3   blood glucose meter kit and supplies, 1 each by Other route as directed. Dispense ONE TOUCH ULTRA 2. Use once per day fasting. (FOR ICD-10 E10.9, E11.9)., Disp: 1 each, Rfl: 0   Calcium  Carbonate-Vitamin D  (CALTRATE 600+D PO), Take 1 tablet by mouth 2 (two) times daily., Disp: , Rfl:    Cholecalciferol (VITAMIN D3) 1000 UNITS CAPS, Take 1 tablet by mouth daily., Disp: , Rfl:    fosinopril  (MONOPRIL ) 40 MG tablet, TAKE ONE TABLET ONCE DAILY FOR BLOOD PRESSURE, Disp: 30 tablet, Rfl: 0   furosemide  (LASIX ) 20 MG tablet, TAKE ONE TABLET DAILY FOR FLUID, Disp: 30 tablet, Rfl: 3   gabapentin  (NEURONTIN ) 300 MG capsule, TAKE ONE CAPSULE AT BEDTIME, Disp: 30 capsule, Rfl: 3   glucose blood (ONETOUCH ULTRA) test strip, CHECK BLOOD SUGAR  ONCE A DAY Dx E11.9, Disp: 100 strip, Rfl: 3   Lancets (ONETOUCH DELICA PLUS LANCET33G) MISC, CHECK BLOOD SUGAR ONCE A DAY Dx E11.9, Disp: 100 each, Rfl: 3   meclizine  (ANTIVERT ) 25 MG tablet, TAKE (1) TABLET THREE TIMES DAILY AS NEEDED DIZZINESS, Disp: 30 tablet, Rfl: 0   metFORMIN  (GLUCOPHAGE ) 500 MG tablet, Take 1 tablet (500 mg total) by mouth daily with breakfast., Disp: 90 tablet, Rfl: 3   metoprolol  tartrate (LOPRESSOR ) 25 MG tablet, TAKE ONE TABLET BY MOUTH TWICE DAILY FOR HEART AND BLOOD PRESSURE, Disp: 180 tablet, Rfl: 0   potassium chloride  SA (KLOR-CON  M) 20 MEQ tablet, TAKE ONE TABLET DAILY, Disp: 30 tablet, Rfl: 0   raloxifene  (EVISTA ) 60 MG tablet, Take 1 tablet (60 mg total) by mouth daily., Disp: 90 tablet, Rfl: 3   rosuvastatin  (CRESTOR ) 10 MG tablet, TAKE 1/2 TABLET DAILY FOR CHOLESTEROL, Disp: 45 tablet, Rfl: 3   clobetasol ointment (TEMOVATE) 0.05 %, Apply topically. (Patient not taking: Reported on 01/08/2024), Disp: , Rfl:    diclofenac  Sodium (VOLTAREN ) 1 % GEL, Apply 2 g topically 4 (four) times daily. (Patient not taking: Reported on 01/08/2024), Disp: 50 g, Rfl: 1 Social History   Socioeconomic History   Marital status: Widowed    Spouse name: Not on file   Number of children: Not on file   Years of education: Not on file  Highest education level: Not on file  Occupational History   Occupation: retired  Tobacco Use   Smoking status: Never   Smokeless tobacco: Never  Vaping Use   Vaping status: Never Used  Substance and Sexual Activity   Alcohol use: No   Drug use: No   Sexual activity: Never  Other Topics Concern   Not on file  Social History Narrative   Lives alone   Close friends and family nearby   Social Drivers of Health   Financial Resource Strain: Low Risk  (01/06/2021)   Overall Financial Resource Strain (CARDIA)    Difficulty of Paying Living Expenses: Not hard at all  Food Insecurity: No Food Insecurity (01/06/2021)   Hunger Vital Sign     Worried About Running Out of Food in the Last Year: Never true    Ran Out of Food in the Last Year: Never true  Transportation Needs: No Transportation Needs (01/06/2021)   PRAPARE - Administrator, Civil Service (Medical): No    Lack of Transportation (Non-Medical): No  Physical Activity: Insufficiently Active (01/06/2021)   Exercise Vital Sign    Days of Exercise per Week: 7 days    Minutes of Exercise per Session: 10 min  Stress: No Stress Concern Present (01/06/2021)   [employer] of Occupational Health - Occupational Stress Questionnaire    Feeling of Stress : Not at all  Social Connections: Moderately Integrated (01/06/2021)   Social Connection and Isolation Panel    Frequency of Communication with Friends and Family: More than three times a week    Frequency of Social Gatherings with Friends and Family: More than three times a week    Attends Religious Services: More than 4 times per year    Active Member of [employer] or Organizations: Yes    Attends Banker Meetings: 1 to 4 times per year    Marital Status: Widowed  Intimate Partner Violence: Not At Risk (01/06/2021)   Humiliation, Afraid, Rape, and Kick questionnaire    Fear of Current or Ex-Partner: No    Emotionally Abused: No    Physically Abused: No    Sexually Abused: No   Family History  Problem Relation Age of Onset   Heart disease Mother    Hypertension Mother    Cancer Father        lymp nodes   Hypertension Brother     Objective: Office vital signs reviewed. BP 135/85   Pulse 72   Temp (!) 97.2 F (36.2 C)   Ht 5' 2 (1.575 m)   Wt 132 lb 8 oz (60.1 kg)   SpO2 95%   BMI 24.23 kg/m   Physical Examination:  General: Awake, alert, well nourished, No acute distress HEENT: Sclera white.  Moist mucous membranes Cardio: regular rate and rhythm, S1S2 heard, no murmurs appreciated Pulm: clear to auscultation bilaterally, no wheezes, rhonchi or rales; normal work of breathing on  room air Extremities: warm, well perfused, nonpitting left leg edema, no cyanosis or clubbing; +1 left pedal pulse +2 right pedal  MSK: Cane for ambulation  Diabetic Foot Exam - Simple   Simple Foot Form Diabetic Foot exam was performed with the following findings: Yes 01/08/2024  9:57 AM  Visual Inspection See comments: Yes Sensation Testing Intact to touch and monofilament testing bilaterally: Yes Pulse Check Posterior Tibialis and Dorsalis pulse intact bilaterally: Yes Comments Edema of the left lower extremity with +1 pedal pulses.  This is chronic.  She has varicose  veins on the side as well     Lab Results  Component Value Date   HGBA1C 7.8 (H) 08/28/2023    Assessment/ Plan: 89 y.o. female   Diabetes mellitus treated with oral medication (HCC) - Plan: Bayer DCA Hb A1c Waived, CMP14+EGFR  Hyperlipidemia associated with type 2 diabetes mellitus (HCC) - Plan: Lipid Panel, CMP14+EGFR  Hypertension associated with stage 3a chronic kidney disease due to type 2 diabetes mellitus (HCC) - Plan: CMP14+EGFR  Immunization due - Plan: Pneumococcal conjugate vaccine 20-valent (Prevnar 20), Flu vaccine HIGH DOSE PF(Fluzone Trivalent)  Pneumococcal and influenza vaccinations administered today.  Nonfasting labs collected.  Blood pressure well-controlled.  No changes.  No refills needed at this time.  A1c result still pending at time of discharge   Norene CHRISTELLA Fielding, DO [facility] [phone]

## 2024-01-09 ENCOUNTER — Other Ambulatory Visit: Payer: Self-pay | Admitting: Family Medicine

## 2024-01-09 LAB — CMP14+EGFR
ALT: 7 IU/L (ref 0–32)
AST: 17 IU/L (ref 0–40)
Albumin: 3.6 g/dL (ref 3.6–4.6)
Alkaline Phosphatase: 60 IU/L (ref 48–129)
BUN/Creatinine Ratio: 15 (ref 12–28)
BUN: 17 mg/dL (ref 10–36)
Bilirubin Total: 0.7 mg/dL (ref 0.0–1.2)
CO2: 26 mmol/L (ref 20–29)
Calcium: 8.4 mg/dL — ABNORMAL LOW (ref 8.7–10.3)
Chloride: 100 mmol/L (ref 96–106)
Creatinine, Ser: 1.11 mg/dL — ABNORMAL HIGH (ref 0.57–1.00)
Globulin, Total: 1.7 g/dL (ref 1.5–4.5)
Glucose: 158 mg/dL — ABNORMAL HIGH (ref 70–99)
Potassium: 3.4 mmol/L — ABNORMAL LOW (ref 3.5–5.2)
Sodium: 142 mmol/L (ref 134–144)
Total Protein: 5.3 g/dL — ABNORMAL LOW (ref 6.0–8.5)
eGFR: 47 mL/min/1.73 — ABNORMAL LOW (ref >59)

## 2024-01-09 LAB — LIPID PANEL
Cholesterol, Total: 93 mg/dL — AB (ref 100–199)
HDL: 49 mg/dL (ref >39)
LDL CALC COMMENT:: 1.9 ratio (ref 0.0–4.4)
LDL Chol Calc (NIH): 27 mg/dL (ref 0–99)
Triglycerides: 83 mg/dL (ref 0–149)
VLDL Cholesterol Cal: 17 mg/dL (ref 5–40)

## 2024-01-10 ENCOUNTER — Ambulatory Visit: Payer: Self-pay | Admitting: Family Medicine

## 2024-01-15 ENCOUNTER — Ambulatory Visit: Admitting: Internal Medicine

## 2024-01-20 ENCOUNTER — Other Ambulatory Visit: Payer: Self-pay | Admitting: Internal Medicine

## 2024-01-20 ENCOUNTER — Other Ambulatory Visit: Payer: Self-pay | Admitting: *Deleted

## 2024-01-20 DIAGNOSIS — E1142 Type 2 diabetes mellitus with diabetic polyneuropathy: Secondary | ICD-10-CM

## 2024-01-23 ENCOUNTER — Encounter: Payer: Self-pay | Admitting: Family Medicine

## 2024-01-24 ENCOUNTER — Other Ambulatory Visit: Payer: Self-pay

## 2024-01-24 DIAGNOSIS — E876 Hypokalemia: Secondary | ICD-10-CM

## 2024-01-24 MED ORDER — POTASSIUM CHLORIDE CRYS ER 20 MEQ PO TBCR
20.0000 meq | EXTENDED_RELEASE_TABLET | Freq: Every day | ORAL | 0 refills | Status: AC
Start: 2024-01-24 — End: ?

## 2024-02-03 ENCOUNTER — Other Ambulatory Visit: Payer: Self-pay | Admitting: Family Medicine

## 2024-02-03 DIAGNOSIS — N1831 Chronic kidney disease, stage 3a: Secondary | ICD-10-CM

## 2024-02-27 ENCOUNTER — Encounter: Payer: Self-pay | Admitting: Family Medicine

## 2024-02-27 DIAGNOSIS — E876 Hypokalemia: Secondary | ICD-10-CM

## 2024-02-28 NOTE — Telephone Encounter (Signed)
 Looks like patient called back today at 11:10 and scheduled a lab appt for 03/18/2024. Will close this encounter.

## 2024-02-28 NOTE — Telephone Encounter (Signed)
 Ok to schedule lab. K was only slightly low.

## 2024-02-28 NOTE — Telephone Encounter (Signed)
Left message for patient to call back to schedule lab appointment.

## 2024-03-17 ENCOUNTER — Ambulatory Visit: Attending: Internal Medicine | Admitting: Internal Medicine

## 2024-03-17 ENCOUNTER — Encounter: Payer: Self-pay | Admitting: Internal Medicine

## 2024-03-17 VITALS — BP 116/80 | HR 78 | Ht 62.0 in | Wt 137.8 lb

## 2024-03-17 DIAGNOSIS — I4891 Unspecified atrial fibrillation: Secondary | ICD-10-CM | POA: Insufficient documentation

## 2024-03-17 DIAGNOSIS — Z7189 Other specified counseling: Secondary | ICD-10-CM | POA: Insufficient documentation

## 2024-03-17 DIAGNOSIS — Z79899 Other long term (current) drug therapy: Secondary | ICD-10-CM

## 2024-03-17 DIAGNOSIS — I491 Atrial premature depolarization: Secondary | ICD-10-CM

## 2024-03-17 MED ORDER — APIXABAN 5 MG PO TABS: 5.0000 mg | ORAL_TABLET | Freq: Two times a day (BID) | ORAL | 5 refills | Status: AC

## 2024-03-17 NOTE — Telephone Encounter (Signed)
 Per Dr. Mallipeddi after the patient had already left office visit.  We need to update echocardiogram due to new onset A-fib.   Called and spoke with family member Joey Hudy. Advised him someone from scheduling will call and make them an appointment. Asked if they would call him at [phone].He verbalized understanding

## 2024-03-17 NOTE — Progress Notes (Signed)
 "    Cardiology Office Note  Date: 03/17/2024   ID: Sara Holmes, DOB Mar 16, 1928, MRN [id number]  PCP:  Jolinda Norene HERO, DO  Cardiologist:  Diannah SHAUNNA Maywood, MD Electrophysiologist:  None    History of Present Illness: Sara Holmes is a 89 y.o. 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 Accompanied by nephew.  Patient was sent from primary care clinic to the [facility] on 01/25/2022 for evaluation of atrial fibrillation. Patient had PCP checkup for irregular heartbeats and dizziness, EKG was performed and showed atrial fibrillation (per EKG report but the baseline artifact made the EKG uninterpretable) and was sent to the ER. In the ER, multiple EKGs were performed which showed normal sinus rhythm and PACs. Her labs were remarkable for hypokalemia and hypomagnesemia which were repleted. Cardiology was consulted in the ER who recommended outpatient echo and Holter monitor and no indication for anticoagulation as it was not A-fib. Upon discharge from the ER, patient denied having any irregular heartbeat, palpitations and dizziness. Nephew said that she was not eating enough food prior to the ER visit but he is making sure she gets adequate nutrition.  Patient was having adequate nutrition and water intake after ER visit. Upon arrival to the cardiology clinic on 02/20/2022), EKG showed SVT (AVNRT) with spontaneously converted to normal sinus rhythm. Patient stated that she was nervous for her cardiology appointment. When she was in SVT/AVNRT, she experienced palpitations.  She subsequently underwent 1 week event monitor on 03/13/2022 that showed normal sinus rhythm with evidence of 638 runs of SVT with the fastest interval lasting 3 mins 21 secs and longest lasting 18 mins 35 secs. These SVT runs are most likely AVNRT but there is a brief irregular SVT which is likely atrial fibrillation. Significant PAC  burden of 13.9%. Symptoms correlated with PAC and SVT episodes. Echocardiogram from 03/2022 showed normal LVEF and mild mitral valve regurgitation.  She is here for follow-up visit, accompanied by nephew.    EKG today showed atrial fibrillation, HR 83 bpm.  She is asymptomatic.  Does not have any palpitations, SOB, fatigue.  No angina.  No symptoms overall.  Doing great.   Past Medical History:  Diagnosis Date   Diabetes mellitus without complication (HCC)    Diverticulosis    Glaucoma    Gout    Hyperlipidemia    Hypertension    Osteopenia    Plantar fasciitis    Sinus bradycardia    Vertigo     Past Surgical History:  Procedure Laterality Date   ABDOMINAL HYSTERECTOMY     CHOLECYSTECTOMY     EYE SURGERY     cataracts   NASAL SEPTUM SURGERY      Current Outpatient Medications  Medication Sig Dispense Refill   allopurinol  (ZYLOPRIM ) 100 MG tablet Take 1 tablet (100 mg total) by mouth daily. 90 tablet 3   blood glucose meter kit and supplies 1 each by Other route as directed. Dispense ONE TOUCH ULTRA 2. Use once per day fasting. (FOR ICD-10 E10.9, E11.9). 1 each 0   Calcium  Carbonate-Vitamin D  (CALTRATE 600+D PO) Take 1 tablet by mouth 2 (two) times daily.     Cholecalciferol (VITAMIN D3) 1000 UNITS CAPS Take 1 tablet by mouth daily.     clobetasol ointment (TEMOVATE) 0.05 % Apply topically. (Patient not taking: Reported on 01/08/2024)     diclofenac  Sodium (VOLTAREN ) 1 % GEL Apply  2 g topically 4 (four) times daily. (Patient not taking: Reported on 01/08/2024) 50 g 1   fosinopril  (MONOPRIL ) 40 MG tablet TAKE ONE TABLET ONCE DAILY FOR BLOOD PRESSURE 30 tablet 5   furosemide  (LASIX ) 20 MG tablet TAKE ONE TABLET DAILY FOR FLUID 30 tablet 4   gabapentin  (NEURONTIN ) 300 MG capsule TAKE ONE CAPSULE AT BEDTIME 30 capsule 4   glucose blood (ONETOUCH ULTRA) test strip CHECK BLOOD SUGAR ONCE A DAY Dx E11.9 100 strip 3   Lancets (ONETOUCH DELICA PLUS LANCET33G) MISC CHECK BLOOD SUGAR ONCE  A DAY Dx E11.9 100 each 3   meclizine  (ANTIVERT ) 25 MG tablet TAKE (1) TABLET THREE TIMES DAILY AS NEEDED DIZZINESS 30 tablet 0   metFORMIN  (GLUCOPHAGE ) 500 MG tablet Take 1 tablet (500 mg total) by mouth daily with breakfast. 90 tablet 3   metoprolol  tartrate (LOPRESSOR ) 25 MG tablet TAKE ONE TABLET TWICE DAILY FOR HEART AND BLOOD PRESSURE 180 tablet 0   potassium chloride  SA (KLOR-CON  M) 20 MEQ tablet Take 1 tablet (20 mEq total) by mouth daily. 120 tablet 0   raloxifene  (EVISTA ) 60 MG tablet Take 1 tablet (60 mg total) by mouth daily. 90 tablet 3   rosuvastatin  (CRESTOR ) 10 MG tablet TAKE 1/2 TABLET DAILY FOR CHOLESTEROL 45 tablet 3   No current facility-administered medications for this visit.   Allergies:  Prednisone , Amoxicillin, and Tetanus toxoid-containing vaccines   Social History: The patient  reports that she has never smoked. She has never used smokeless tobacco. She reports that she does not drink alcohol and does not use drugs.   Family History: The patient's family history includes Cancer in her father; Heart disease in her mother; Hypertension in her brother and mother.   ROS:  Please see the history of present illness. Otherwise, complete review of systems is positive for none.  All other systems are reviewed and negative.   Physical Exam: VS:  Ht 5' 2 (1.575 m)   BMI 24.23 kg/m , BMI Body mass index is 24.23 kg/m.  Wt Readings from Last 3 Encounters:  01/08/24 132 lb 8 oz (60.1 kg)  08/28/23 136 lb (61.7 kg)  04/30/23 139 lb (63 kg)    General: Patient appears comfortable at rest. HEENT: Conjunctiva and lids normal, oropharynx clear with moist mucosa. Neck: Supple, no elevated JVP or carotid bruits, no thyromegaly. Lungs: Clear to auscultation, nonlabored breathing at rest. Cardiac: Regular rate and rhythm, no S3 or significant systolic murmur, no pericardial rub. Abdomen: Soft, nontender, no hepatomegaly, bowel sounds present, no guarding or  rebound. Extremities: 2+ pitting edema in bilateral lower extremities Skin: Warm and dry. Musculoskeletal: No kyphosis. Neuropsychiatric: Alert and oriented x3, affect grossly appropriate.  ECG: EKG on 02/20/2022 showed SVT (AVNRT) which spontaneously converted to normal sinus rhythm.  Subsequent EKG showed normal sinus rhythm.  Recent Labwork: 01/08/2024: ALT 7; AST 17; BUN 17; Creatinine, Ser 1.11; Potassium 3.4; Sodium 142     Component Value Date/Time   CHOL 93 (L) 01/08/2024 0949   CHOL 122 08/06/2012 0849   TRIG 83 01/08/2024 0949   TRIG 130 07/26/2014 0844   TRIG 124 08/06/2012 0849   HDL 49 01/08/2024 0949   HDL 56 07/26/2014 0844   HDL 46 08/06/2012 0849   CHOLHDL 1.9 01/08/2024 0949   LDLCALC 27 01/08/2024 0949   LDLCALC 53 01/01/2014 0837   LDLCALC 51 08/06/2012 0849    Other Studies Reviewed Today: Echocardiogram from 03/16/2022 Normal LVEF Mild MR  Event monitor from  03/13/2022 Normal sinus rhythm with evidence of 638 runs of SVT with the fastest interval lasting 3 mins 21 secs and longest lasting 18 mins 35 secs. These SVT runs are most likely AVNRT but there is a brief irregular SVT which is likely atrial fibrillation. Significant PAC burden of 13.9%. Symptoms correlated with PAC and SVT episodes.   Assessment and Plan:  Atrial fibrillation, new - New onset by EKG today. - BP soft, continue metoprolol  tartrate 25 mg twice daily. - Obtain CBC.  If hemoglobin normal, start Eliquis  5 mg twice daily.  Patient is instructed to pick up Eliquis  prescription only after she hears back from our office regarding her CBC result.  She verbalized understanding. - Defer DCCV and OSA evaluation due to advanced age and absence of symptoms. - Update echocardiogram.  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  On metoprolol  tartrate 25 mg twice daily. - Continue metoprolol  tartrate 25 mg twice daily.  HTN, controlled -  Continue p.o. Lasix  20 mg once daily, metoprolol  tartrate 25 mg twice daily.  I spent 30 minutes in reviewing prior medical records, reports, more than 3 labs, discussion and documentation.  Medication Adjustments/Labs and Tests Ordered: Current medicines are reviewed at length with the patient today.  Concerns regarding medicines are outlined above.   Tests Ordered: Orders Placed This Encounter  Procedures   EKG 12-Lead    Medication Changes: No orders of the defined types were placed in this encounter.   Disposition:  Follow up 6 months  Signed Haunani Dickard Priya Pius Byrom, MD, 03/17/2024 10:23 AM    [facility] at [facility] [address] [place], [place], [place] [address]  "

## 2024-03-17 NOTE — Patient Instructions (Signed)
 Medication Instructions:  Your physician has recommended you make the following change in your medication:  Start taking Eliquis  5 mg twice daily after Dr Mallipeddi received your lab results  Continue taking all other medications as prescribed  Labwork: CBC to be completed at [facility] today  Testing/Procedures: None  Follow-Up: Your physician recommends that you schedule a follow-up appointment in: 6 months  Any Other Special Instructions Will Be Listed Below (If Applicable). Thank you for choosing [facility]!     If you need a refill on your cardiac medications before your next appointment, please call your pharmacy.

## 2024-03-18 ENCOUNTER — Other Ambulatory Visit

## 2024-03-18 DIAGNOSIS — E876 Hypokalemia: Secondary | ICD-10-CM

## 2024-03-19 ENCOUNTER — Ambulatory Visit: Payer: Self-pay | Admitting: Internal Medicine

## 2024-03-19 DIAGNOSIS — D649 Anemia, unspecified: Secondary | ICD-10-CM

## 2024-03-19 LAB — BASIC METABOLIC PANEL WITH GFR
BUN/Creatinine Ratio: 15 (ref 12–28)
BUN: 20 mg/dL (ref 10–36)
CO2: 28 mmol/L (ref 20–29)
Calcium: 8.7 mg/dL (ref 8.7–10.3)
Chloride: 101 mmol/L (ref 96–106)
Creatinine, Ser: 1.3 mg/dL — ABNORMAL HIGH (ref 0.57–1.00)
Glucose: 221 mg/dL — ABNORMAL HIGH (ref 70–99)
Potassium: 4.6 mmol/L (ref 3.5–5.2)
Sodium: 143 mmol/L (ref 134–144)
eGFR: 38 mL/min/1.73 — ABNORMAL LOW

## 2024-03-19 LAB — CBC
Hematocrit: 31.2 % — ABNORMAL LOW (ref 34.0–46.6)
Hemoglobin: 10.1 g/dL — ABNORMAL LOW (ref 11.1–15.9)
MCH: 33.1 pg — ABNORMAL HIGH (ref 26.6–33.0)
MCHC: 32.4 g/dL (ref 31.5–35.7)
MCV: 102 fL — ABNORMAL HIGH (ref 79–97)
Platelets: 176 x10E3/uL (ref 150–450)
RBC: 3.05 x10E6/uL — ABNORMAL LOW (ref 3.77–5.28)
RDW: 13.1 % (ref 11.7–15.4)
WBC: 6.8 x10E3/uL (ref 3.4–10.8)

## 2024-03-20 ENCOUNTER — Other Ambulatory Visit: Payer: Self-pay

## 2024-03-20 ENCOUNTER — Other Ambulatory Visit

## 2024-03-20 ENCOUNTER — Ambulatory Visit: Payer: Self-pay | Admitting: Family Medicine

## 2024-03-20 DIAGNOSIS — D649 Anemia, unspecified: Secondary | ICD-10-CM

## 2024-03-20 NOTE — Telephone Encounter (Signed)
 The patient has been notified of the result and verbalized understanding.  All questions (if any) were answered. Littie CHRISTELLA Croak, CMA 03/20/2024 8:30 AM

## 2024-03-20 NOTE — Addendum Note (Signed)
 Addended by: JOLINDA NORENE HERO on: 03/20/2024 09:55 AM   Modules accepted: Orders

## 2024-03-20 NOTE — Telephone Encounter (Signed)
-----   Message from Vishnu Mallipeddi, MD sent at 03/19/2024 11:09 AM EST ----- Hemoglobin normal (but dropped by 2 points when compared with hemoglobin from 1 year ago).  Asymptomatic.  Okay to start Eliquis  5 mg twice daily.

## 2024-03-23 ENCOUNTER — Other Ambulatory Visit

## 2024-03-23 ENCOUNTER — Other Ambulatory Visit: Payer: Self-pay | Admitting: Family Medicine

## 2024-03-23 DIAGNOSIS — N1831 Type 2 diabetes mellitus with diabetic chronic kidney disease: Secondary | ICD-10-CM

## 2024-03-23 LAB — CBC WITH DIFFERENTIAL/PLATELET
Basophils Absolute: 0 x10E3/uL (ref 0.0–0.2)
Basos: 0 %
EOS (ABSOLUTE): 0 x10E3/uL (ref 0.0–0.4)
Eos: 1 %
Hemoglobin: 9.9 g/dL — ABNORMAL LOW (ref 11.1–15.9)
Immature Grans (Abs): 0 x10E3/uL (ref 0.0–0.1)
Immature Granulocytes: 0 %
Lymphocytes Absolute: 1.4 x10E3/uL (ref 0.7–3.1)
Lymphs: 24 %
MCH: 31.7 pg (ref 26.6–33.0)
MCHC: 30.9 g/dL — ABNORMAL LOW (ref 31.5–35.7)
MCV: 103 fL — ABNORMAL HIGH (ref 79–97)
Monocytes Absolute: 0.5 x10E3/uL (ref 0.1–0.9)
Monocytes: 8 %
Neutrophils Absolute: 3.8 x10E3/uL (ref 1.4–7.0)
Neutrophils: 67 %
Platelets: 168 x10E3/uL (ref 150–450)
RBC: 3.12 x10E6/uL — ABNORMAL LOW (ref 3.77–5.28)
RDW: 13 % (ref 11.7–15.4)
WBC: 5.7 x10E3/uL (ref 3.4–10.8)

## 2024-03-23 LAB — ANEMIA PANEL
Ferritin: 50 ng/mL (ref 15–150)
Folate, Hemolysate: 283 ng/mL
Folate, RBC: 884 ng/mL
Hematocrit: 32 % — ABNORMAL LOW (ref 34.0–46.6)
Iron Saturation: 28 % (ref 15–55)
Iron: 58 ug/dL (ref 27–139)
Retic Ct Pct: 4 % — ABNORMAL HIGH (ref 0.6–2.6)
Total Iron Binding Capacity: 208 ug/dL — AB (ref 250–450)
UIBC: 150 ug/dL (ref 118–369)
Vitamin B-12: 272 pg/mL (ref 232–1245)

## 2024-03-24 ENCOUNTER — Ambulatory Visit: Payer: Self-pay | Admitting: Family Medicine

## 2024-03-24 LAB — FECAL OCCULT BLOOD, IMMUNOCHEMICAL: Fecal Occult Bld: NEGATIVE

## 2024-03-24 MED ORDER — ACCU-CHEK GUIDE ME W/DEVICE KIT: PACK | Status: AC

## 2024-03-24 MED ORDER — ACCU-CHEK GUIDE TEST VI STRP: ORAL_STRIP | Status: AC

## 2024-03-24 MED ORDER — ACCU-CHEK SOFTCLIX LANCETS MISC: 3 refills | Status: AC

## 2024-03-24 NOTE — Addendum Note (Signed)
 Addended by: INA RAMP D on: 03/24/2024 08:34 AM   Modules accepted: Orders

## 2024-03-24 NOTE — Telephone Encounter (Signed)
 Fax from [employer] for PA for [employer]. Ins has not been covering [employer], pharmacy changed over to [facility]. Will put this in her chart as no print for meter, test strips and lancets so that the chart is correct.

## 2024-04-01 ENCOUNTER — Ambulatory Visit: Attending: Internal Medicine

## 2024-04-01 DIAGNOSIS — I4891 Unspecified atrial fibrillation: Secondary | ICD-10-CM

## 2024-04-01 LAB — ECHOCARDIOGRAM COMPLETE
AR max vel: 2.95 cm2
AV Peak grad: 8 mmHg
Ao pk vel: 1.41 m/s
Area-P 1/2: 4.36 cm2
Calc EF: 59.9 %
MV M vel: 5.32 m/s
MV Peak grad: 113 mmHg
S' Lateral: 2.6 cm
Single Plane A2C EF: 64.8 %
Single Plane A4C EF: 56.1 %

## 2024-04-08 ENCOUNTER — Encounter: Payer: Self-pay | Admitting: Internal Medicine

## 2024-04-17 ENCOUNTER — Ambulatory Visit: Payer: Self-pay | Admitting: Internal Medicine

## 2024-05-13 ENCOUNTER — Ambulatory Visit: Admitting: Family Medicine
